# Patient Record
Sex: Female | Born: 1985
Health system: Southern US, Community
[De-identification: ages and names within clinical notes are randomized; demographics above are authoritative.]

## PROBLEM LIST (undated history)

## (undated) DIAGNOSIS — R000 Tachycardia, unspecified: Secondary | ICD-10-CM

## (undated) DIAGNOSIS — K219 Gastro-esophageal reflux disease without esophagitis: Secondary | ICD-10-CM

## (undated) DIAGNOSIS — R112 Nausea with vomiting, unspecified: Secondary | ICD-10-CM

## (undated) DIAGNOSIS — F4310 Post-traumatic stress disorder, unspecified: Secondary | ICD-10-CM

## (undated) DIAGNOSIS — I951 Orthostatic hypotension: Secondary | ICD-10-CM

## (undated) DIAGNOSIS — Z9889 Other specified postprocedural states: Secondary | ICD-10-CM

## (undated) DIAGNOSIS — R102 Pelvic and perineal pain: Secondary | ICD-10-CM

## (undated) DIAGNOSIS — G90A Postural orthostatic tachycardia syndrome (POTS): Secondary | ICD-10-CM

## (undated) DIAGNOSIS — N809 Endometriosis, unspecified: Secondary | ICD-10-CM

## (undated) DIAGNOSIS — N83209 Unspecified ovarian cyst, unspecified side: Secondary | ICD-10-CM

## (undated) DIAGNOSIS — G8929 Other chronic pain: Secondary | ICD-10-CM

## (undated) DIAGNOSIS — I498 Other specified cardiac arrhythmias: Secondary | ICD-10-CM

## (undated) DIAGNOSIS — F419 Anxiety disorder, unspecified: Secondary | ICD-10-CM

## (undated) DIAGNOSIS — S3992XA Unspecified injury of lower back, initial encounter: Secondary | ICD-10-CM

## (undated) HISTORY — PX: KNEE SURGERY: SHX244

## (undated) HISTORY — PX: ABDOMINAL HYSTERECTOMY: SHX81

## (undated) HISTORY — DX: Post-traumatic stress disorder, unspecified: F43.10

## (undated) HISTORY — DX: Endometriosis, unspecified: N80.9

## (undated) HISTORY — DX: Pelvic and perineal pain: R10.2

## (undated) HISTORY — DX: Gastro-esophageal reflux disease without esophagitis: K21.9

---
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
## 2008-05-30 ENCOUNTER — Emergency Department (HOSPITAL_COMMUNITY): Admission: EM | Admit: 2008-05-30 | Discharge: 2008-05-30 | Payer: Self-pay | Admitting: Emergency Medicine

## 2008-06-04 ENCOUNTER — Ambulatory Visit (HOSPITAL_COMMUNITY): Admission: RE | Admit: 2008-06-04 | Discharge: 2008-06-04 | Payer: Self-pay | Admitting: Pulmonary Disease

## 2008-06-04 IMAGING — CT CT PELVIS W/ CM
2 of 4 series · 16 of 46 positions shown, 18 images · IV contrast (agent unspecified)
Comparison: None

CT ABDOMEN

CLINICAL DATA: Abdominal and pelvic pain for 8 days

CT ABDOMEN AND PELVIS WITH CONTRAST
TECHNIQUE: Multidetector CT imaging of the abdomen and pelvis was
performed using the standard protocol following bolus
administration of intravenous contrast.  Sagittal and coronal MPR
images reconstructed from axial data set.
Contrast: Dilute oral contrast and 100 ml [NF]

[Series 2: abd_pel_with 5.0 b40f · axial · 0.68mm/px · z∈[-498,-113]mm · 13 of 88 slices shown, 15 images]
[im 7/88  soft-tissue]
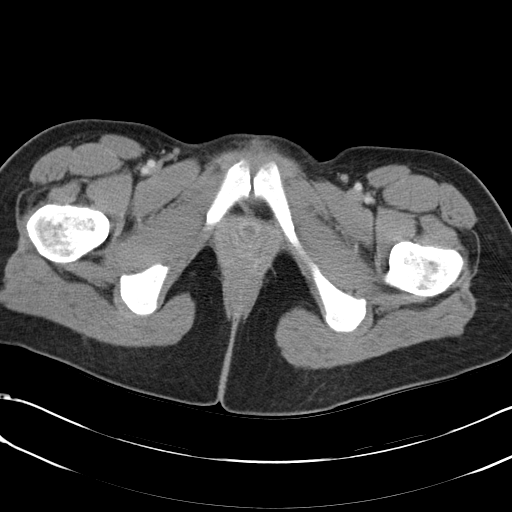
[im 7/88  bone]
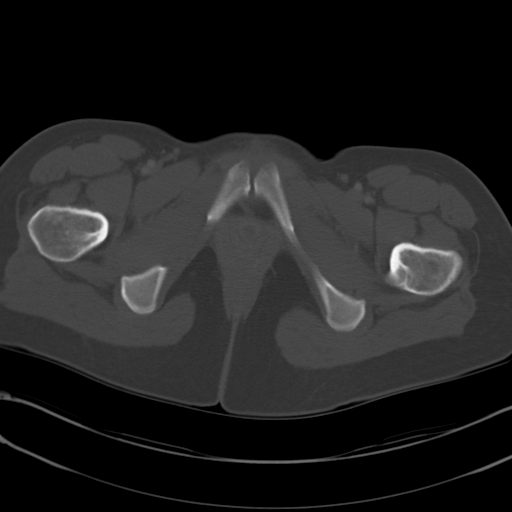
[im 13/88  soft-tissue]
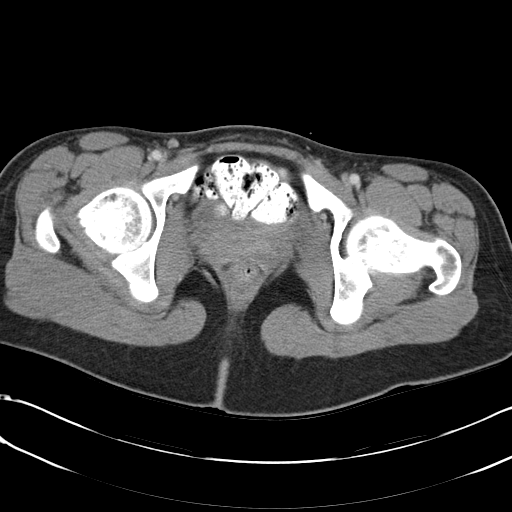
[im 20/88  soft-tissue]
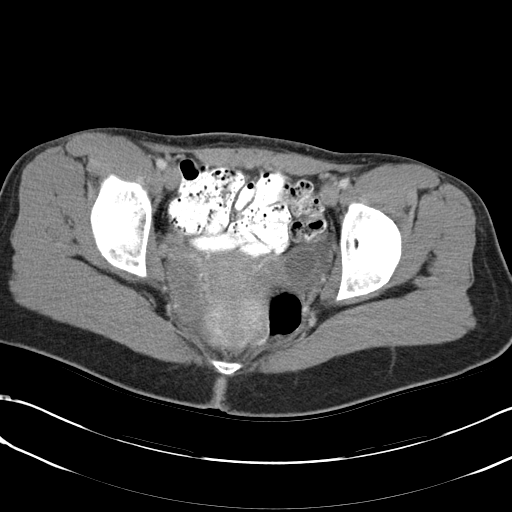
[im 26/88  soft-tissue]
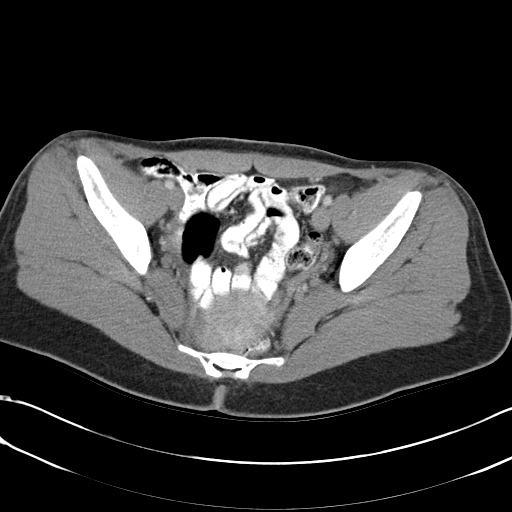
[im 33/88  soft-tissue]
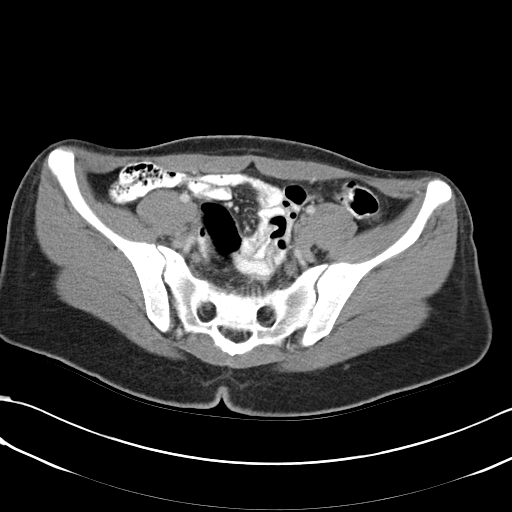
[im 39/88  soft-tissue]
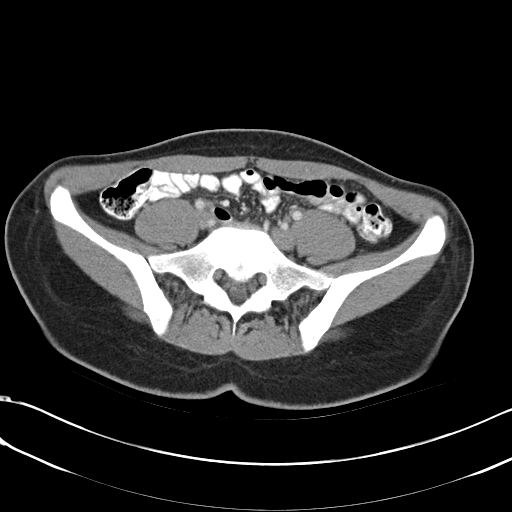
[im 46/88  soft-tissue]
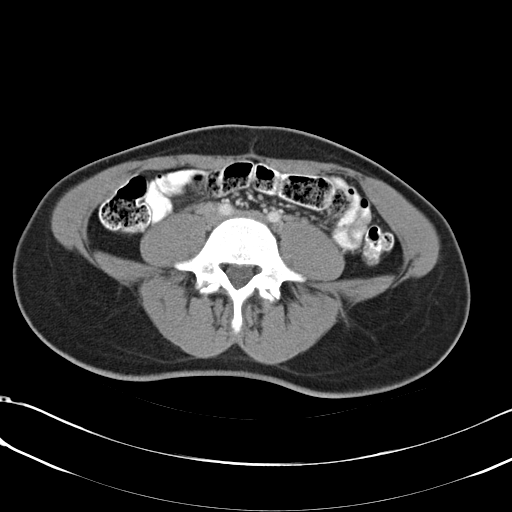
[im 52/88  soft-tissue]
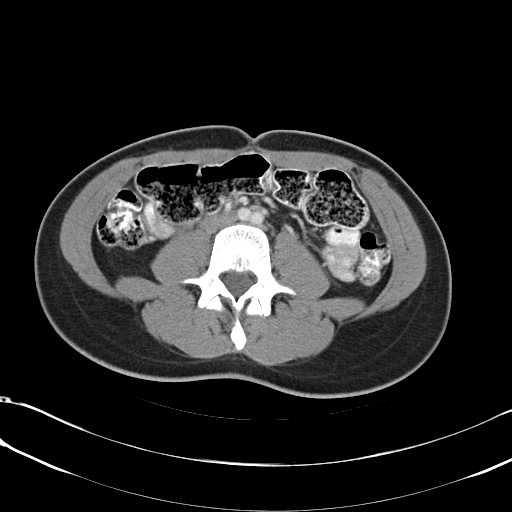
[im 59/88  soft-tissue]
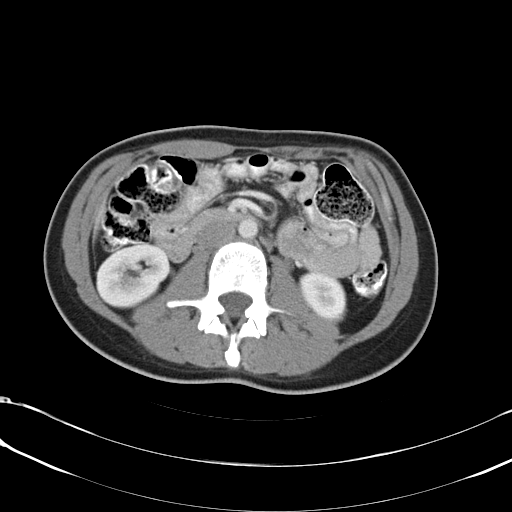
[im 59/88  bone]
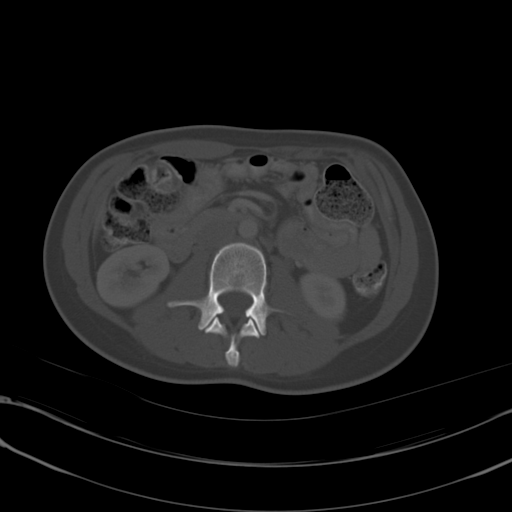
[im 65/88  soft-tissue]
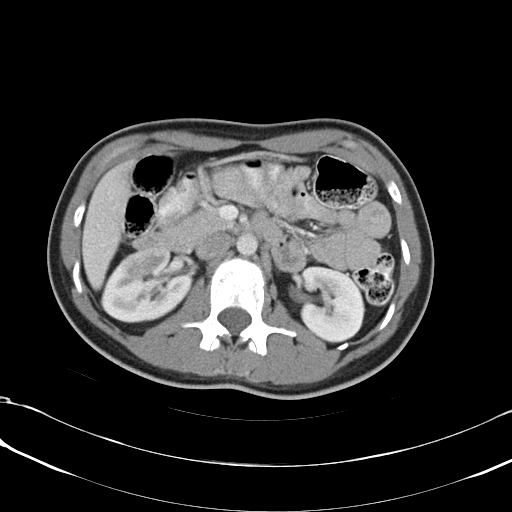
[im 71/88  soft-tissue]
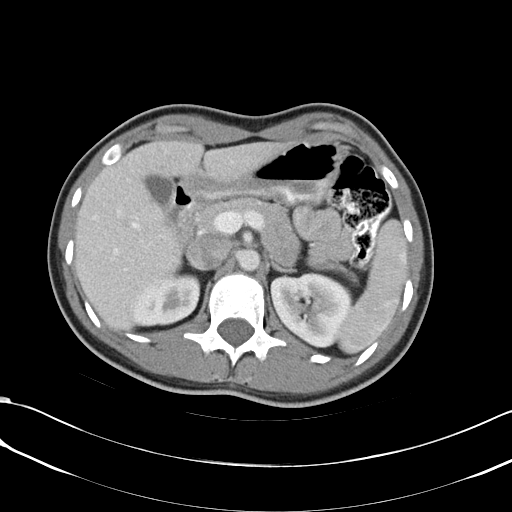
[im 78/88  soft-tissue]
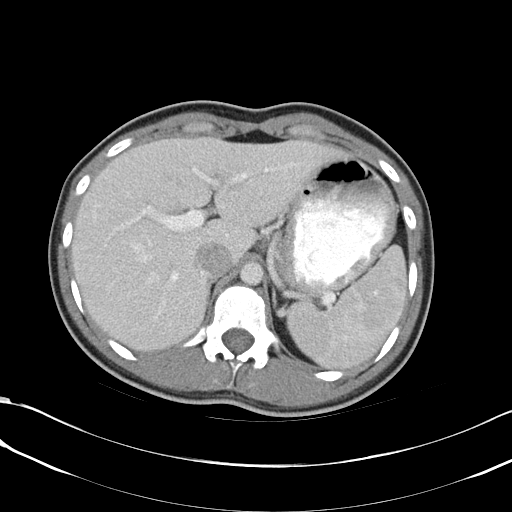
[im 84/88  soft-tissue]
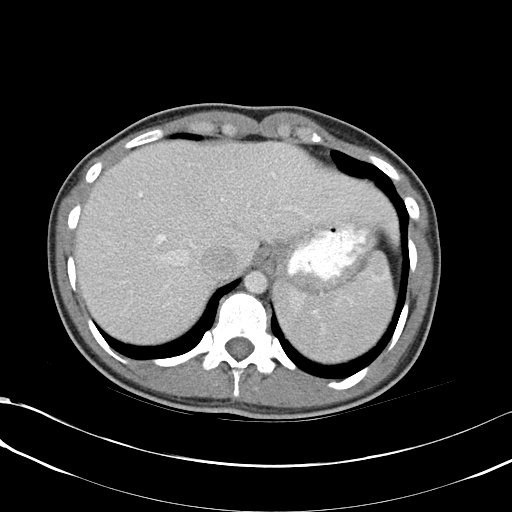

[Series 4: mpr cor post contrast (id) · coronal · 0.75mm/px · 3 of 61 slices shown]
[im 21/61  soft-tissue]
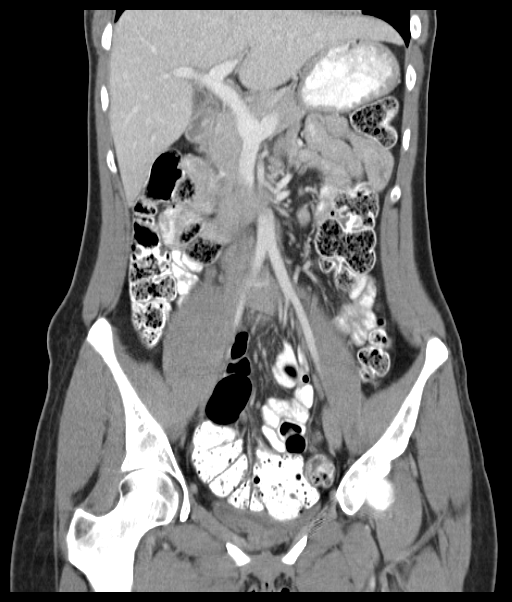
[im 27/61  soft-tissue]
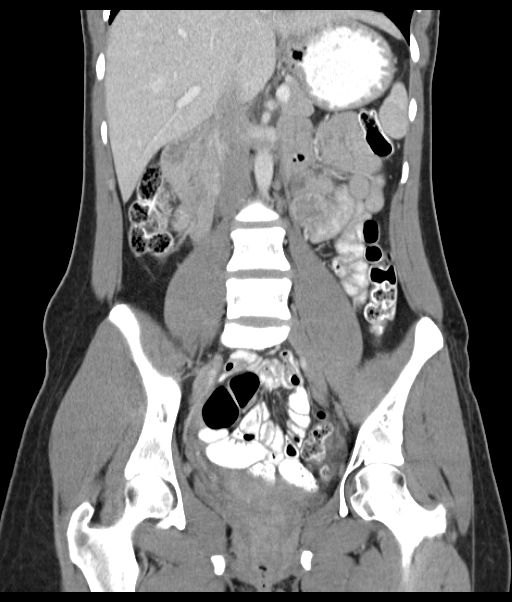
[im 34/61  soft-tissue]
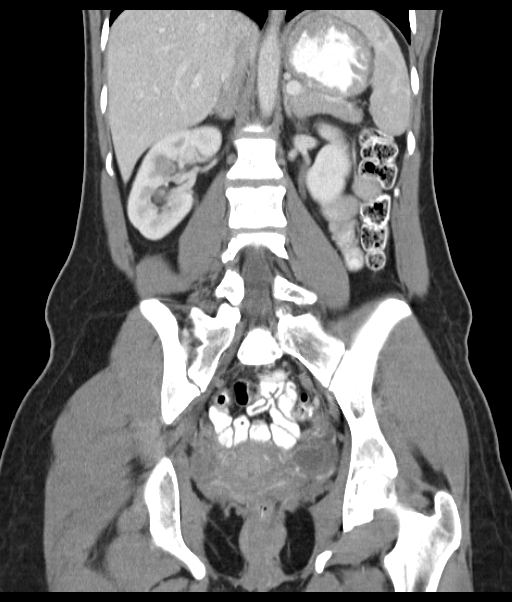

[16 of 46 positions shown; findings below may reference images not displayed]

FINDINGS: Lung bases clear.
Cranial margin of liver excluded.
No focal abnormalities within visualized portions of liver, spleen,
pancreas, left kidney, or adrenal glands.
Patchy irregularity of nephrogram of right kidney at upper pole,
suspicious for pyelonephritis.
No acute bony abnormalities.
IMPRESSION: Patchiness of nephrogram at upper pole right kidney, suspicious for
pyelonephritis.
Correlation with urinalysis recommended.

CT PELVIS
FINDINGS: Unremarkable uterus and decompressed urinary bladder.
Low attenuation left adnexal mass, 2.2 x 3.3 cm likely ovarian cyst
image 68.
Small amount of nonspecific free pelvic fluid.
Somewhat poorly defined contours of ovaries and adnexae
bilaterally, nonspecific.
Large and small bowel loops in pelvis normal.
Appendix not visualized.
No pelvic mass, adenopathy, free fluid, or inflammatory process.
IMPRESSION: Left ovarian cyst 3.3 x 2.2 cm.
Minimal free intrapelvic fluid, nonspecific.
No other definite intrapelvic abnormalities are identified.
Appendix is not visualized and while no definite evidence of
appendicitis is identified, recommend clinical correlation.

## 2008-06-07 ENCOUNTER — Encounter (HOSPITAL_COMMUNITY): Admission: RE | Admit: 2008-06-07 | Discharge: 2008-07-07 | Payer: Self-pay | Admitting: Orthopedic Surgery

## 2010-06-07 LAB — CBC
Hemoglobin: 12.3 g/dL (ref 12.0–15.0)
MCHC: 34.9 g/dL (ref 30.0–36.0)
RBC: 3.76 MIL/uL — ABNORMAL LOW (ref 3.87–5.11)
WBC: 8.6 10*3/uL (ref 4.0–10.5)

## 2010-06-07 LAB — DIFFERENTIAL
Lymphocytes Relative: 24 % (ref 12–46)
Lymphs Abs: 2.1 10*3/uL (ref 0.7–4.0)
Monocytes Absolute: 0.4 10*3/uL (ref 0.1–1.0)
Monocytes Relative: 5 % (ref 3–12)
Neutro Abs: 6 10*3/uL (ref 1.7–7.7)

## 2010-06-07 LAB — URINALYSIS, ROUTINE W REFLEX MICROSCOPIC
Bilirubin Urine: NEGATIVE
Glucose, UA: NEGATIVE mg/dL
Hgb urine dipstick: NEGATIVE
Ketones, ur: NEGATIVE mg/dL
Nitrite: NEGATIVE
Protein, ur: NEGATIVE mg/dL
Specific Gravity, Urine: 1.01 (ref 1.005–1.030)
Urobilinogen, UA: 0.2 mg/dL (ref 0.0–1.0)
pH: 6 (ref 5.0–8.0)

## 2010-06-07 LAB — BASIC METABOLIC PANEL WITH GFR
BUN: 11 mg/dL (ref 6–23)
CO2: 25 meq/L (ref 19–32)
Calcium: 8.7 mg/dL (ref 8.4–10.5)
Chloride: 106 meq/L (ref 96–112)
Creatinine, Ser: 0.81 mg/dL (ref 0.4–1.2)
GFR calc Af Amer: >60 mL/min (ref >60)
GFR calc non Af Amer: >60 mL/min (ref >60)
Glucose, Bld: 86 mg/dL (ref 70–99)
Potassium: 3.2 meq/L — ABNORMAL LOW (ref 3.5–5.1)
Sodium: 136 meq/L (ref 135–145)

## 2010-06-07 LAB — PREGNANCY, URINE: Preg Test, Ur: NEGATIVE

## 2010-07-14 NOTE — Discharge Summary (Signed)
   Rachel Griffith, SANE                      ACCOUNT NO.:  [id number]   MEDICAL RECORD NO.:  [id number]                   PATIENT TYPE:  INP   LOCATION:  A428                                 FACILITY:  [facility]   PHYSICIAN:  Edward L. Juanetta Gosling, M.D.             DATE OF BIRTH:  05-20-1985   DATE OF ADMISSION:  05/16/2002  DATE OF DISCHARGE:  05/17/2002                                 DISCHARGE SUMMARY   PROBLEM:  Nausea and vomiting, probably related to pain medication.  She is  status post knee surgery.   SUBJECTIVE:  The patient is an 25 year old who presented to the emergency  room after developing nausea and vomiting after taking Percocet for pain.  She had had knee surgery on the day of admission.  She became dizzy and  actually, to some extent, collapsed at home.  Her physical examination  showed a well-developed, well-nourished female who had a large wrap on her  right knee.  Her chest was clear, her heart was regular, her abdomen was  soft, electrolytes were normal.   HOSPITAL COURSE:  She was treated with IV fluids and investigation of  several pain medications was taken to find one that she could take that  would not make her nauseated.  It appears that Tylenol No. 3 two tablets did  the job.  She was placed on Vioxx 25 mg daily.  She had no further nausea or  vomiting, she was not dizzy, she was able to get up and move to some extent  except for the limitations placed by her knee.   DISPOSITION:  She is discharged home in improved condition.   MEDICATIONS:  1. Tylenol No. 3 two tablets q.4h. p.r.n. pain.  2. Vioxx 25 mg daily.   FOLLOW-UP:  She is encouraged to follow-up with Dr. Eulah Pont and Dr. Thurston Hole,  the orthopedic surgical group, regarding her knee surgery and whether  anything else needed to be done.                                               Edward L. Juanetta Gosling, M.D.    Gwenlyn Found  D:  05/17/2002  T:  05/18/2002  Job:  [id number]   cc:   M.D. Mckinley Jewel &  Salvatore Marvel

## 2010-07-14 NOTE — H&P (Signed)
Rachel Griffith, Rachel Griffith                      ACCOUNT NO.:  [id number]   MEDICAL RECORD NO.:  [id number]                   PATIENT TYPE:  INP   LOCATION:  A428                                 FACILITY:  [facility]   PHYSICIAN:  Gracelyn Nurse, M.D.              DATE OF BIRTH:  07/24/1985   DATE OF ADMISSION:  05/16/2002  DATE OF DISCHARGE:                                HISTORY & PHYSICAL   CHIEF COMPLAINT:  Nausea and vomiting.   HISTORY OF PRESENT ILLNESS:  This is an 25 year old white female who  presents with nausea and vomiting.  She had surgery on her right knee  yesterday, did well until she took some Percocet for pain.  She became  nauseated and dizzy.  She vomited a couple of times.  She called Dr. Eulah Pont,  the orthopedist who did the surgery, and he changed her medicine to Vicodin  and gave her some Phenergan.  However, she had the same results with taking  the Vicodin.  She comes tonight and has not vomiting in the ER but still  gets kind of dizzy when she stands up.   PAST MEDICAL HISTORY:  Nothing but the current knee surgery that she had  yesterday.   ALLERGIES:  SULFA.   CURRENT MEDICATIONS:  She takes none regularly.  Has been on the pain  medicine since yesterday.   SOCIAL HISTORY:  Does not smoke, does not drink alcohol, does not do any  drugs.  She is single.   FAMILY HISTORY:  Mother has hypothyroidism and has SVT.  Father had no  health problems.   REVIEW OF SYSTEMS:  As per HPI, remainder of systems negative.   PHYSICAL EXAMINATION:  VITAL SIGNS:  Temperature 97.5, pulse 72,  respirations 16, blood pressure 112/59.  GENERAL:  This is a well-nourished white female in no acute distress.  HEENT:  Pupils equal, round, reactive to light.  Extraocular movement  intact.  Oral mucosa is moist.  Oropharynx clear.  CARDIOVASCULAR:  Regular rate and rhythm.  No murmurs.  LUNGS:  Clear to auscultation.  ABDOMEN:  Soft, nontender, nondistended.  Bowel sounds are  positive.  EXTREMITIES:  No edema.  Right knee is in an ACE bandage.  NEUROLOGICAL:  Cranial nerves II-XII grossly intact.  No focal deficits.  She does become dizzy with some possible vertigo when she is seated in the  upright position.  SKIN:  Moist with no rash.   ASSESSMENT AND PLAN:  1. Nausea and vomiting.  This is likely secondary to the pain medications     plus or minus possible residual effects from the anesthesia.  She cannot     tell me if she got a general or local anesthesia.  She still feels dizzy     when she sits up.  I am not sure if this is true vertigo.  Will go ahead     and admit her for  observation, give her some IV fluids, antiemetics     p.r.n., and we will change her pain medicines to Tylenol No. 3 plus some     IV morphine for breakthrough pain to see if she will tolerate this a     little bit better.  2. Status post knee surgery.  She is to followup on Wednesday with Dr.     Eulah Pont.                                               Gracelyn Nurse, M.D.    JDJ/MEDQ  D:  05/16/2002  T:  05/17/2002  Job:  [id number]

## 2010-07-14 NOTE — Op Note (Signed)
   Rachel Griffith, Rachel Griffith                      ACCOUNT NO.:  [id number]   MEDICAL RECORD NO.:  [id number]                   PATIENT TYPE:  AMB   LOCATION:  DSC                                  FACILITY:  [facility]   PHYSICIAN:  Loreta Ave, M.D.              DATE OF BIRTH:  [birth year]   DATE OF PROCEDURE:  06/04/2002  DATE OF DISCHARGE:                                 OPERATIVE REPORT   PREOPERATIVE DIAGNOSIS:  Medial plica with synovitis, left knee.   POSTOPERATIVE DIAGNOSIS:  Medial plica with synovitis, left knee.   PROCEDURE:  Left knee examination under anesthesia, arthroscopy, with  excision of medial plica and partial synovectomy.   SURGEON:  Loreta Ave, M.D.   ASSISTANT:  Arlys John D. Petrarca, P.A.-C.   ANESTHESIA:  Knee block with sedation.   SPECIMENS:  None.   CULTURES:  None.   COMPLICATIONS:  None.   DRESSING:  Soft compressive.   Patient brought to the operating room and placed on the operating table in  the supine position.  After adequate anesthesia had been obtained, left knee  examined.  Full motion, good stability, palpable medial plica.  Good  patellofemoral tracking.  Tourniquet and leg holder applied.  Leg prepped  and draped in the usual sterile fashion.  Three portals created, one  superolateral, one each medial and lateral peripatellar.  An inflow catheter  introduced, knee distended, arthroscope introduced, knee inspected.  Medial  plica with associated synovitis extending down over the fat pad.  Excised in  its entirety.  Some roughing of the medial femoral condyle under the plica  debrided as well.  Remaining knee examined.  Good patellofemoral tracking  and cartilage without chondromalacia.  Medial meniscus, medial compartment,  lateral meniscus, lateral compartment were all otherwise normal.  Cruciate  ligaments intact.  Instruments and fluid removed.  Portals and knee injected  with Marcaine.  Portals closed with 4-0 nylon.  A sterile  compressive  dressing applied.  Anesthesia reversed.  Brought to the recovery room.  Tolerated the surgery well with no complications.                                               Loreta Ave, M.D.    DFM/MEDQ  D:  06/04/2002  T:  06/05/2002  Job:  [id number]

## 2010-07-14 NOTE — Group Therapy Note (Signed)
   Rachel Griffith, Rachel Griffith                        ACCOUNT NO.:  [id number]   MEDICAL RECORD NO.:  [id number]                   PATIENT TYPE:   LOCATION:                                       FACILITY:   PHYSICIAN:  Edward L. Juanetta Gosling, M.D.             DATE OF BIRTH:   DATE OF PROCEDURE:  DATE OF DISCHARGE:                                   PROGRESS NOTE   PROBLEM:  Admitted with nausea and vomiting.   SUBJECTIVE:  Ms. Ballowe is an 25 year old who had knee surgery and did well  as far as the knee surgery was concerned but when she went home, she  developed nausea and vomiting which appears to be related to her pain  medications. She became dizzy as well. She says that she is better now but  she is still having a lot of pain. The concern is that she is taking  Morphine for pain and I am going to try to get her on something that it a  little more easily taken by mouth to control her pain at home. I am also  going to add Vioxx. She is otherwise doing well and has no complaints.   OBJECTIVE:  Her examination shows that her chest is clear. Her heart is  regular. Abdomen is soft.   ASSESSMENT:  The knee, she says is painful but it does look appropriate  postoperatively.   PLAN:  It is encased in a large bandage.                                               Edward L. Juanetta Gosling, M.D.    ELH/MEDQ  D:  05/17/2002  T:  05/17/2002  Job:  [id number]

## 2010-10-29 ENCOUNTER — Encounter: Payer: Self-pay | Admitting: *Deleted

## 2010-10-29 ENCOUNTER — Emergency Department (HOSPITAL_COMMUNITY)
Admission: EM | Admit: 2010-10-29 | Discharge: 2010-10-29 | Disposition: A | Discharge status: Home / Self Care | Payer: Worker's Compensation | Attending: Emergency Medicine | Admitting: Emergency Medicine

## 2010-10-29 DIAGNOSIS — S61219A Laceration without foreign body of unspecified finger without damage to nail, initial encounter: Secondary | ICD-10-CM

## 2010-10-29 DIAGNOSIS — S61209A Unspecified open wound of unspecified finger without damage to nail, initial encounter: Secondary | ICD-10-CM | POA: Insufficient documentation

## 2010-10-29 DIAGNOSIS — Y9269 Other specified industrial and construction area as the place of occurrence of the external cause: Secondary | ICD-10-CM | POA: Insufficient documentation

## 2010-10-29 DIAGNOSIS — W268XXA Contact with other sharp object(s), not elsewhere classified, initial encounter: Secondary | ICD-10-CM | POA: Insufficient documentation

## 2010-10-29 DIAGNOSIS — F17200 Nicotine dependence, unspecified, uncomplicated: Secondary | ICD-10-CM | POA: Insufficient documentation

## 2010-10-29 MED ORDER — BACITRACIN ZINC 500 UNIT/GM EX OINT
TOPICAL_OINTMENT | Freq: Once | CUTANEOUS | Status: DC
  Filled 2010-10-29: qty 0.9

## 2010-10-29 NOTE — ED Notes (Signed)
Pt stated no needs

## 2010-10-29 NOTE — ED Provider Notes (Signed)
History     CSN: [id number] Arrival date & time: 10/29/2010  3:25 AM  Chief Complaint  Patient presents with  . Laceration   HPI Comments: Seen 9  Patient is a 25 y.o. female presenting with skin laceration. The history is provided by the patient.  Laceration  The incident occurred 6 to 12 hours ago. Pain location: leftt ring finger  The laceration is 1 cm in size. Injury mechanism: cut by kep being pushed in a tight space. The pain is at a severity of 3/10. The pain is mild. The pain has been constant since onset. She reports no foreign bodies present. Her tetanus status is UTD.    History reviewed. No pertinent past medical history.  Past Surgical History  Procedure Date  . Knee surgery     History reviewed. No pertinent family history.  History  Substance Use Topics  . Smoking status: Current Everyday Smoker  . Smokeless tobacco: Not on file  . Alcohol Use: Yes    OB History    Grav Para Term Preterm Abortions TAB SAB Ect Mult Living                  Review of Systems  All other systems reviewed and are negative.    Physical Exam  BP 116/72  Pulse 58  Temp(Src) 98.6 F (37 C) (Oral)  Resp 20  Ht 5\' 8"  (1.727 m)  Wt 135 lb (61.236 kg)  BMI 20.53 kg/m2  SpO2 99%  LMP 10/22/2010  Physical Exam  Nursing note and vitals reviewed. Constitutional: She is oriented to person, place, and time. She appears well-developed and well-nourished. No distress.  HENT:  Head: Normocephalic.  Eyes: EOM are normal.  Neck: Normal range of motion. Neck supple.  Cardiovascular: Normal rate, normal heart sounds and intact distal pulses.   Pulmonary/Chest: Effort normal.  Abdominal: Soft.  Neurological: She is alert and oriented to person, place, and time.  Skin: Skin is warm and dry.       Flap laceration 2 cm to left ring finger. Skin in place, bleeding controlled    ED Course  Procedures  Patient with non sutureable  laceration to left ring finger. Bacitracin and  dressing applied Annamarie Dawley., MD  MDM Reviewed: nursing note and vitals     Nicoletta Dress. Colon Branch, MD 10/29/10 [phone]

## 2010-10-29 NOTE — ED Notes (Signed)
Pt was moving a keg and sliced her ring finger on her left hand.

## 2012-02-27 HISTORY — PX: OTHER SURGICAL HISTORY: SHX169

## 2012-05-08 ENCOUNTER — Encounter (HOSPITAL_COMMUNITY): Payer: Self-pay | Admitting: *Deleted

## 2012-05-08 ENCOUNTER — Emergency Department (HOSPITAL_COMMUNITY)
Admission: EM | Admit: 2012-05-08 | Discharge: 2012-05-08 | Disposition: A | Discharge status: Home / Self Care | Payer: Managed Care, Other (non HMO) | Attending: Emergency Medicine | Admitting: Emergency Medicine

## 2012-05-08 ENCOUNTER — Emergency Department (HOSPITAL_COMMUNITY): Payer: Managed Care, Other (non HMO)

## 2012-05-08 DIAGNOSIS — Z3202 Encounter for pregnancy test, result negative: Secondary | ICD-10-CM | POA: Insufficient documentation

## 2012-05-08 DIAGNOSIS — F17200 Nicotine dependence, unspecified, uncomplicated: Secondary | ICD-10-CM | POA: Insufficient documentation

## 2012-05-08 DIAGNOSIS — Z8742 Personal history of other diseases of the female genital tract: Secondary | ICD-10-CM | POA: Insufficient documentation

## 2012-05-08 DIAGNOSIS — N760 Acute vaginitis: Secondary | ICD-10-CM | POA: Insufficient documentation

## 2012-05-08 DIAGNOSIS — R102 Pelvic and perineal pain unspecified side: Secondary | ICD-10-CM

## 2012-05-08 DIAGNOSIS — N949 Unspecified condition associated with female genital organs and menstrual cycle: Secondary | ICD-10-CM | POA: Insufficient documentation

## 2012-05-08 DIAGNOSIS — G8929 Other chronic pain: Secondary | ICD-10-CM | POA: Insufficient documentation

## 2012-05-08 DIAGNOSIS — B9689 Other specified bacterial agents as the cause of diseases classified elsewhere: Secondary | ICD-10-CM

## 2012-05-08 HISTORY — DX: Other chronic pain: G89.29

## 2012-05-08 HISTORY — DX: Endometriosis, unspecified: N80.9

## 2012-05-08 HISTORY — DX: Unspecified ovarian cyst, unspecified side: N83.209

## 2012-05-08 HISTORY — DX: Pelvic and perineal pain: R10.2

## 2012-05-08 LAB — COMPREHENSIVE METABOLIC PANEL
ALT: 10 U/L (ref 0–35)
AST: 12 U/L (ref 0–37)
CO2: 25 mEq/L (ref 19–32)
Calcium: 9 mg/dL (ref 8.4–10.5)
Chloride: 103 mEq/L (ref 96–112)
Creatinine, Ser: 0.81 mg/dL (ref 0.50–1.10)
GFR calc Af Amer: >90 mL/min (ref >90)
GFR calc non Af Amer: >90 mL/min (ref >90)
Glucose, Bld: 94 mg/dL (ref 70–99)
Sodium: 139 mEq/L (ref 135–145)
Total Bilirubin: 0.2 mg/dL — ABNORMAL LOW (ref 0.3–1.2)

## 2012-05-08 LAB — CBC WITH DIFFERENTIAL/PLATELET
Basophils Absolute: 0 10*3/uL (ref 0.0–0.1)
Eosinophils Relative: 1 % (ref 0–5)
HCT: 34.9 % — ABNORMAL LOW (ref 36.0–46.0)
Lymphocytes Relative: 29 % (ref 12–46)
Lymphs Abs: 2.2 10*3/uL (ref 0.7–4.0)
MCV: 92.3 fL (ref 78.0–100.0)
Monocytes Absolute: 0.5 10*3/uL (ref 0.1–1.0)
Neutro Abs: 4.6 10*3/uL (ref 1.7–7.7)
RBC: 3.78 MIL/uL — ABNORMAL LOW (ref 3.87–5.11)
WBC: 7.4 10*3/uL (ref 4.0–10.5)

## 2012-05-08 LAB — URINALYSIS, ROUTINE W REFLEX MICROSCOPIC
Bilirubin Urine: NEGATIVE
Glucose, UA: NEGATIVE mg/dL
Hgb urine dipstick: NEGATIVE
Specific Gravity, Urine: 1.01 (ref 1.005–1.030)
pH: 5.5 (ref 5.0–8.0)

## 2012-05-08 LAB — WET PREP, GENITAL: Trich, Wet Prep: NONE SEEN

## 2012-05-08 LAB — PREGNANCY, URINE: Preg Test, Ur: NEGATIVE

## 2012-05-08 IMAGING — US US PELVIS COMPLETE
1 series · 14 of 25 positions shown · non-contrast
Comparison: CT [DATE]

CLINICAL DATA: Left-sided pelvic pain.

TRANSABDOMINAL AND TRANSVAGINAL ULTRASOUND OF PELVIS
DOPPLER ULTRASOUND OF OVARIES
TECHNIQUE: Both transabdominal and transvaginal ultrasound
examinations of the pelvis were performed including evaluation of
the uterus, ovaries, adnexal regions, and pelvic cul-de-sac. Color
and duplex Doppler ultrasound was utilized to evaluate blood flow
to the ovaries.

[Series 1: us pelvis complete · 0.29mm/px · 14 of 82 slices shown]
[im 1/82]
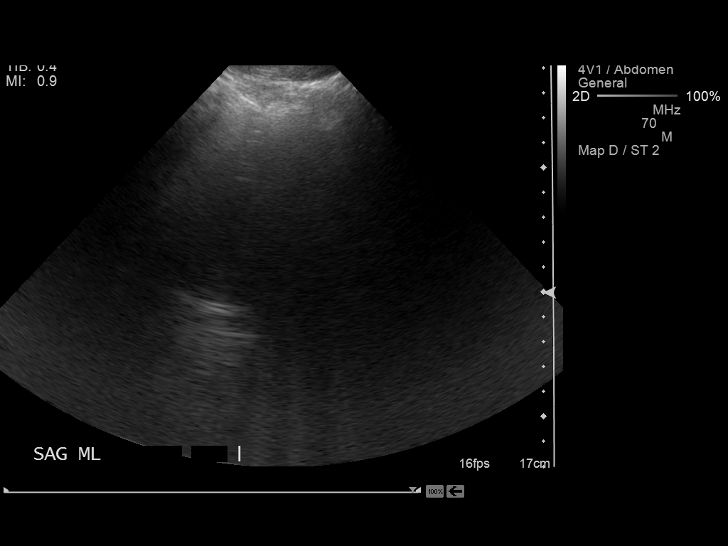
[im 7/82]
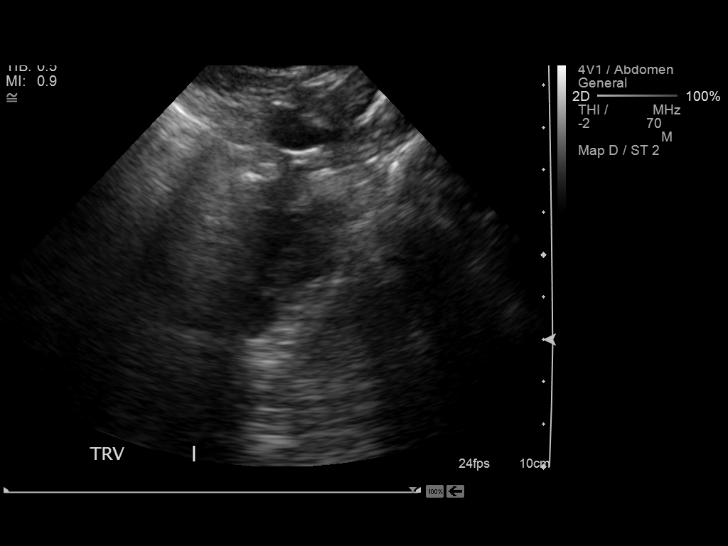
[im 14/82]
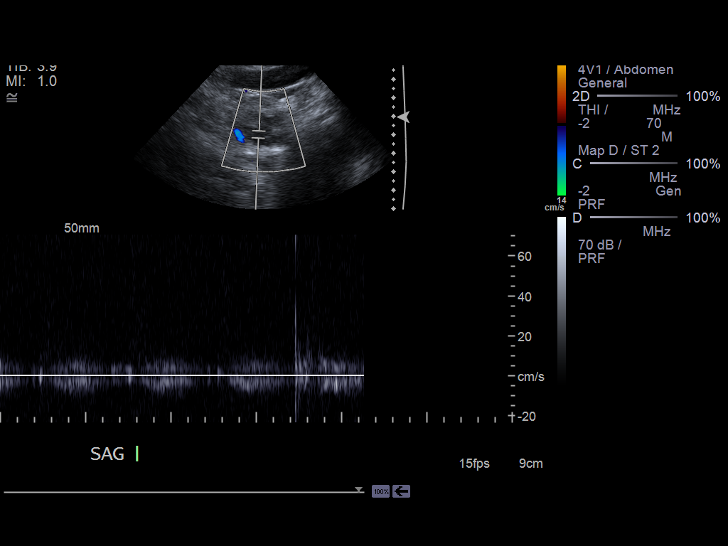
[im 21/82]
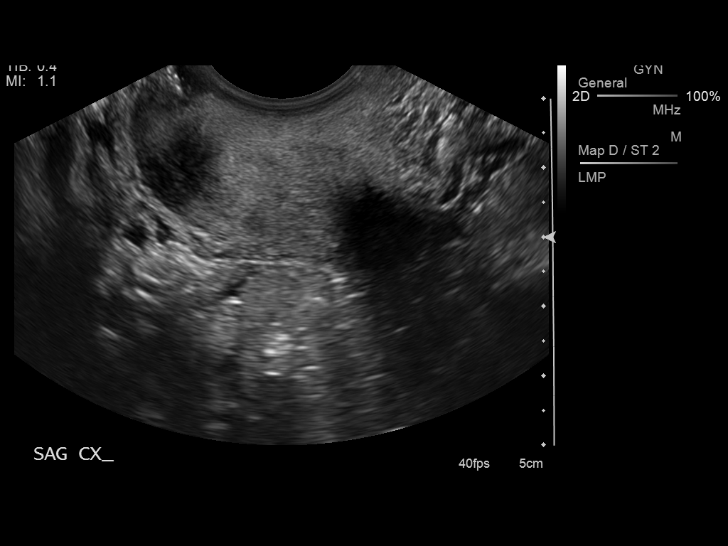
[im 28/82]
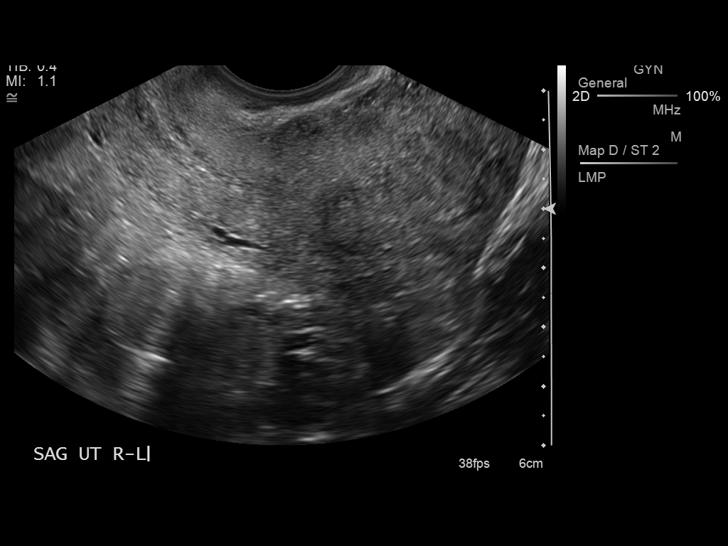
[im 31/82]
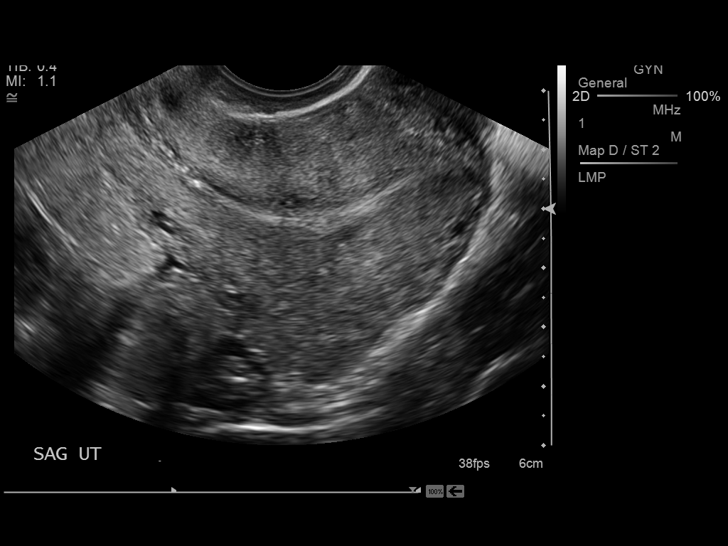
[im 38/82]
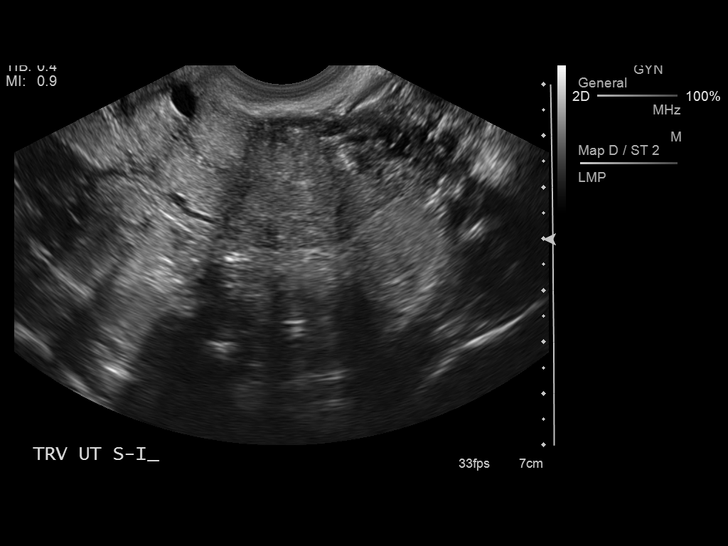
[im 44/82]
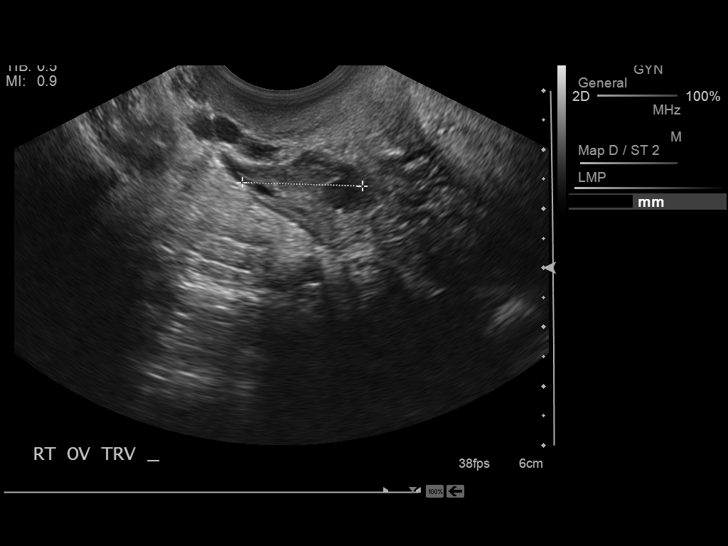
[im 51/82]
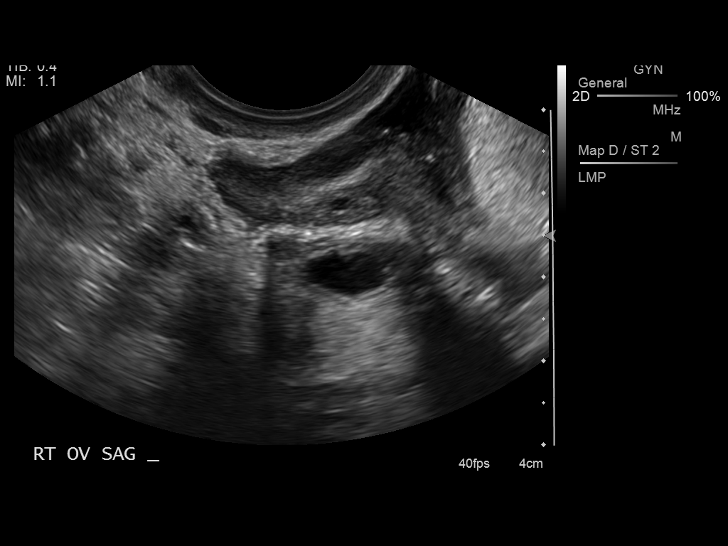
[im 55/82]
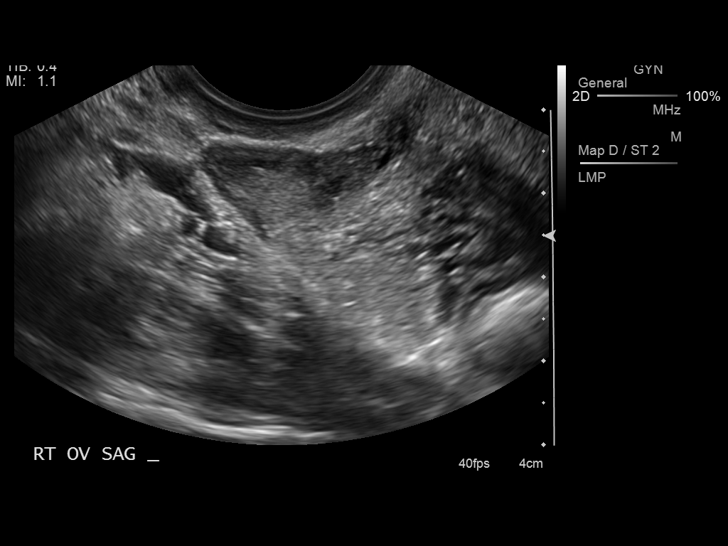
[im 61/82]
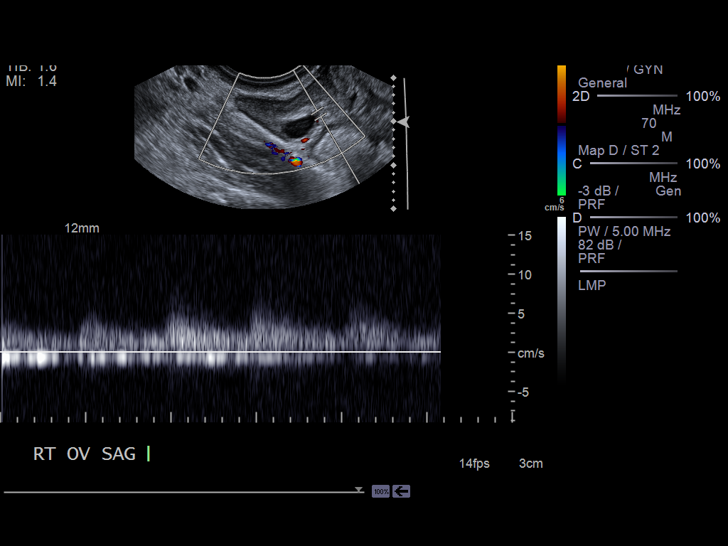
[im 68/82]
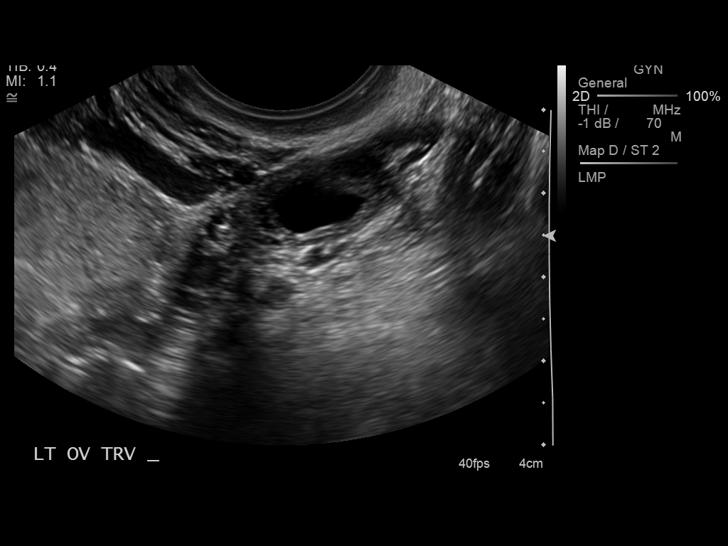
[im 75/82]
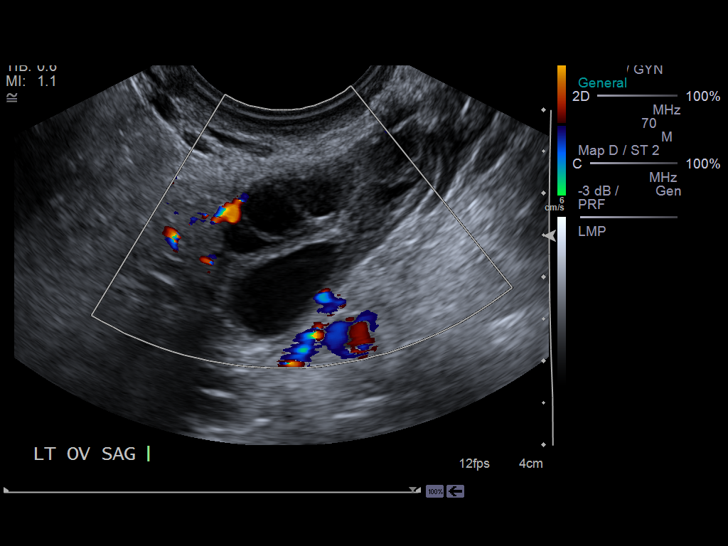
[im 82/82]
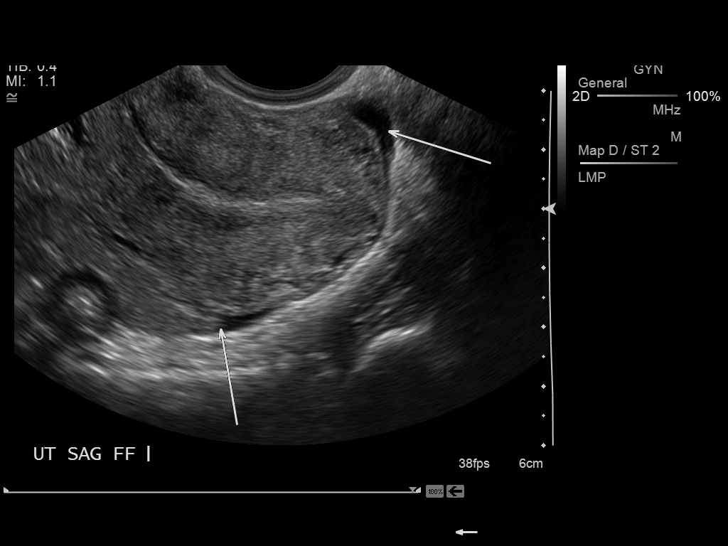

[14 of 25 positions shown; findings below may reference images not displayed]

FINDINGS: Uterus: 6.8 x 4.2 x 5.2 cm.  Uterus is retroverted.  No focal
abnormality.  Mildly heterogeneous echotexture.

Endometrium: Normal thickness and appearance, 5 mm.

Right Ovary: 3.3 x 1.2 x 2.0 cm. Normal size and echotexture.  No
adnexal masses.  Small follicles.  Arterial and venous blood flow
documented.

Left Ovary: 3.6 x 1.8 x 2.7 cm.  Multiple follicles within the left
ovary, the largest of 0.5 cm.  Arterial and venous blood flow
documented.

Other Findings:  Small amount of free fluid in the pelvis.
IMPRESSION: No evidence of ovarian torsion.  Multiple follicles within the left
ovary.

## 2012-05-08 MED ORDER — NAPROXEN 250 MG PO TABS: 250.0000 mg | ORAL_TABLET | Freq: Two times a day (BID) | ORAL | Status: DC

## 2012-05-08 MED ORDER — HYDROMORPHONE HCL PF 1 MG/ML IJ SOLN
1.0000 mg | Freq: Once | INTRAMUSCULAR | Status: AC
  Administered 2012-05-08: 1 mg via INTRAMUSCULAR
  Filled 2012-05-08: qty 1

## 2012-05-08 MED ORDER — HYDROCODONE-ACETAMINOPHEN 5-325 MG PO TABS: ORAL_TABLET | ORAL | Status: DC

## 2012-05-08 MED ORDER — METRONIDAZOLE 500 MG PO TABS: 500.0000 mg | ORAL_TABLET | Freq: Two times a day (BID) | ORAL | Status: DC

## 2012-05-08 NOTE — ED Provider Notes (Signed)
History     CSN: [id number]  Arrival date & time 05/08/12  9629   First MD Initiated Contact with Patient 05/08/12 [phone]      Chief Complaint  Patient presents with  . Pelvic Pain     HPI Pt was seen at 0955.   Per pt, c/o gradual onset and persistence of constant acute flair of her chronic left sided pelvic "pain" for the past 4 years, worse over the past several days.  Pt states she has been unable to get in to see her usual OB/GYN MD in [facility] and has run out of her pain meds. Pt's pain worsened since she has not been taking her pain meds. Describes her pain as per her usual symptom pattern for the past 4 years.  States she does not have a definitive dx; "they just say I might have an ovarian cyst or endometriosis."  Denies any change in her usual chronic pain pattern, no N/V/D, no back pain, no fevers, no dysuria, no vaginal bleeding/discharge.     Past Medical History  Diagnosis Date  . Endometriosis   . Ovarian cyst   . Chronic pelvic pain in female     Past Surgical History  Procedure Laterality Date  . Knee surgery      History  Substance Use Topics  . Smoking status: Current Every Day Smoker  . Smokeless tobacco: Not on file  . Alcohol Use: Yes    OB History   Grav Para Term Preterm Abortions TAB SAB Ect Mult Living   2 2 2              Review of Systems ROS: Statement: All systems negative except as marked or noted in the HPI; Constitutional: Negative for fever and chills. ; ; Eyes: Negative for eye pain, redness and discharge. ; ; ENMT: Negative for ear pain, hoarseness, nasal congestion, sinus pressure and sore throat. ; ; Cardiovascular: Negative for chest pain, palpitations, diaphoresis, dyspnea and peripheral edema. ; ; Respiratory: Negative for cough, wheezing and stridor. ; ; Gastrointestinal: Negative for nausea, vomiting, diarrhea, abdominal pain, blood in stool, hematemesis, jaundice and rectal bleeding. . ; ; Genitourinary: Negative for dysuria,  flank pain and hematuria. ; ; GYN:  +pelvic pain. No vaginal bleeding, no vaginal discharge, no vulvar pain.;; Musculoskeletal: Negative for back pain and neck pain. Negative for swelling and trauma.; ; Skin: Negative for pruritus, rash, abrasions, blisters, bruising and skin lesion.; ; Neuro: Negative for headache, lightheadedness and neck stiffness. Negative for weakness, altered level of consciousness , altered mental status, extremity weakness, paresthesias, involuntary movement, seizure and syncope.        Allergies  Sulfa antibiotics  Home Medications  No current outpatient prescriptions on file.  BP 114/66  Pulse 87  Temp(Src) 98.3 F (36.8 C) (Oral)  Resp 16  Ht 5\' 8"  (1.727 m)  Wt 142 lb (64.411 kg)  BMI 21.6 kg/m2  SpO2 100%  LMP 02/27/2012  Physical Exam 1000: Physical examination:  Nursing notes reviewed; Vital signs and O2 SAT reviewed;  Constitutional: Well developed, Well nourished, Well hydrated, In no acute distress; Head:  Normocephalic, atraumatic; Eyes: EOMI, PERRL, No scleral icterus; ENMT: Mouth and pharynx normal, Mucous membranes moist; Neck: Supple, Full range of motion, No lymphadenopathy; Cardiovascular: Regular rate and rhythm, No murmur, rub, or gallop; Respiratory: Breath sounds clear & equal bilaterally, No rales, rhonchi, wheezes.  Speaking full sentences with ease, Normal respiratory effort/excursion; Chest: Nontender, Movement normal; Abdomen: Soft, +mild LLQ tenderness to  palp. No rebound or guarding. Nondistended, Normal bowel sounds; Genitourinary: No CVA tenderness. Pelvic exam performed with permission of pt and female ED tech assist during exam.  External genitalia w/o lesions. Vaginal vault without discharge.  Cervix w/o lesions, not friable, GC/chlam and wet prep obtained and sent to lab.  Bimanual exam w/o CMT, uterine or right adnexal tenderness. +left pelvic tenderness.;; Extremities: Pulses normal, No tenderness, No edema, No calf edema or  asymmetry.; Neuro: AA&Ox3, Major CN grossly intact.  Speech clear. Climbs on and off stretcher easily by herself. Gait steady. No gross focal motor or sensory deficits in extremities.; Skin: Color normal, Warm, Dry.   ED Course  Procedures   MDM  MDM Reviewed: previous chart, nursing note and vitals Interpretation: labs and ultrasound   Results for orders placed during the hospital encounter of 05/08/12  WET PREP, GENITAL      Result Value Range   Yeast Wet Prep HPF POC NONE SEEN  NONE SEEN   Trich, Wet Prep NONE SEEN  NONE SEEN   Clue Cells Wet Prep HPF POC MANY (*) NONE SEEN   WBC, Wet Prep HPF POC MANY (*) NONE SEEN  URINALYSIS, ROUTINE W REFLEX MICROSCOPIC      Result Value Range   Color, Urine YELLOW  YELLOW   APPearance CLEAR  CLEAR   Specific Gravity, Urine 1.010  1.005 - 1.030   pH 5.5  5.0 - 8.0   Glucose, UA NEGATIVE  NEGATIVE mg/dL   Hgb urine dipstick NEGATIVE  NEGATIVE   Bilirubin Urine NEGATIVE  NEGATIVE   Ketones, ur NEGATIVE  NEGATIVE mg/dL   Protein, ur NEGATIVE  NEGATIVE mg/dL   Urobilinogen, UA 0.2  0.0 - 1.0 mg/dL   Nitrite NEGATIVE  NEGATIVE   Leukocytes, UA NEGATIVE  NEGATIVE  PREGNANCY, URINE      Result Value Range   Preg Test, Ur NEGATIVE  NEGATIVE  CBC WITH DIFFERENTIAL      Result Value Range   WBC 7.4  4.0 - 10.5 K/uL   RBC 3.78 (*) 3.87 - 5.11 MIL/uL   Hemoglobin 12.4  12.0 - 15.0 g/dL   HCT 13.0 (*) 86.5 - 78.4 %   MCV 92.3  78.0 - 100.0 fL   MCH 32.8  26.0 - 34.0 pg   MCHC 35.5  30.0 - 36.0 g/dL   RDW 69.6  29.5 - 28.4 %   Platelets 180  150 - 400 K/uL   Neutrophils Relative 63  43 - 77 %   Neutro Abs 4.6  1.7 - 7.7 K/uL   Lymphocytes Relative 29  12 - 46 %   Lymphs Abs 2.2  0.7 - 4.0 K/uL   Monocytes Relative 7  3 - 12 %   Monocytes Absolute 0.5  0.1 - 1.0 K/uL   Eosinophils Relative 1  0 - 5 %   Eosinophils Absolute 0.1  0.0 - 0.7 K/uL   Basophils Relative 0  0 - 1 %   Basophils Absolute 0.0  0.0 - 0.1 K/uL  COMPREHENSIVE  METABOLIC PANEL      Result Value Range   Sodium 139  135 - 145 mEq/L   Potassium 3.8  3.5 - 5.1 mEq/L   Chloride 103  96 - 112 mEq/L   CO2 25  19 - 32 mEq/L   Glucose, Bld 94  70 - 99 mg/dL   BUN 15  6 - 23 mg/dL   Creatinine, Ser 1.32  0.50 - 1.10 mg/dL  Calcium 9.0  8.4 - 10.5 mg/dL   Total Protein 6.9  6.0 - 8.3 g/dL   Albumin 3.9  3.5 - 5.2 g/dL   AST 12  0 - 37 U/L   ALT 10  0 - 35 U/L   Alkaline Phosphatase 36 (*) 39 - 117 U/L   Total Bilirubin 0.2 (*) 0.3 - 1.2 mg/dL   GFR calc non Af Amer >90  >90 mL/min   GFR calc Af Amer >90  >90 mL/min  LIPASE, BLOOD      Result Value Range   Lipase 36  11 - 59 U/L   US Transvaginal Non-ob 05/08/2012  *RADIOLOGY REPORT*  Clinical Data: Left-sided pelvic pain.  TRANSABDOMINAL AND TRANSVAGINAL ULTRASOUND OF PELVIS DOPPLER ULTRASOUND OF OVARIES  Technique:  Both transabdominal and transvaginal ultrasound examinations of the pelvis were performed including evaluation of the uterus, ovaries, adnexal regions, and pelvic cul-de-sac. Color and duplex Doppler ultrasound was utilized to evaluate blood flow to the ovaries.  Comparison: CT 06/04/2008  Findings:  Uterus: 6.8 x 4.2 x 5.2 cm.  Uterus is retroverted.  No focal abnormality.  Mildly heterogeneous echotexture.  Endometrium: Normal thickness and appearance, 5 mm.  Right Ovary: 3.3 x 1.2 x 2.0 cm. Normal size and echotexture.  No adnexal masses.  Small follicles.  Arterial and venous blood flow documented.  Left Ovary: 3.6 x 1.8 x 2.7 cm.  Multiple follicles within the left ovary, the largest of 0.5 cm.  Arterial and venous blood flow documented.  Other Findings:  Small amount of free fluid in the pelvis.  IMPRESSION: No evidence of ovarian torsion.  Multiple follicles within the left ovary.   Original Report Authenticated By: Charlett Nose, M.D.      1245:  Pt states she has spoken to her OB/GYN while she has been in the ED.  States she has scheduled exploratory laparoscopy for next week to further  evaluate her chronic pain.  Pt wants to go home now. Requesting a refill of her usual narcotic pain meds. Dx and testing d/w pt and family.  Questions answered.  Verb understanding, agreeable to d/c home with outpt f/u.            Laray Anger, DO 05/10/12 [phone]

## 2012-05-08 NOTE — ED Notes (Addendum)
Pt states that she started having left sided lower abdominal pain a couple of days ago worse than her usual, states she has pain after pressing on the area on the left.   States she has had this pain in her left lower abdomen for over 4 years and her previous doctors had mentions ovarian cysts and endometriosis, states that she has an exploratory laparotomy scheduled next week.

## 2012-05-08 NOTE — ED Notes (Signed)
Patient w/dx of endometriosis awaiting surgery at some point in near future.  Has not been able to get into see PMD for refill of pain medication.  Pain currently 10/10, fluctuating.

## 2012-05-09 LAB — GC/CHLAMYDIA PROBE AMP
CT Probe RNA: NEGATIVE
GC Probe RNA: NEGATIVE

## 2013-04-24 ENCOUNTER — Emergency Department (HOSPITAL_COMMUNITY)
Admission: EM | Admit: 2013-04-24 | Discharge: 2013-04-24 | Disposition: A | Discharge status: Home / Self Care | Payer: Managed Care, Other (non HMO) | Attending: Emergency Medicine | Admitting: Emergency Medicine

## 2013-04-24 ENCOUNTER — Encounter (HOSPITAL_COMMUNITY): Payer: Self-pay | Admitting: Emergency Medicine

## 2013-04-24 DIAGNOSIS — R059 Cough, unspecified: Secondary | ICD-10-CM | POA: Insufficient documentation

## 2013-04-24 DIAGNOSIS — R05 Cough: Secondary | ICD-10-CM | POA: Insufficient documentation

## 2013-04-24 DIAGNOSIS — IMO0002 Reserved for concepts with insufficient information to code with codable children: Secondary | ICD-10-CM | POA: Insufficient documentation

## 2013-04-24 DIAGNOSIS — Z791 Long term (current) use of non-steroidal anti-inflammatories (NSAID): Secondary | ICD-10-CM | POA: Insufficient documentation

## 2013-04-24 DIAGNOSIS — M549 Dorsalgia, unspecified: Secondary | ICD-10-CM

## 2013-04-24 DIAGNOSIS — Z79899 Other long term (current) drug therapy: Secondary | ICD-10-CM | POA: Insufficient documentation

## 2013-04-24 DIAGNOSIS — G8929 Other chronic pain: Secondary | ICD-10-CM | POA: Insufficient documentation

## 2013-04-24 DIAGNOSIS — Z792 Long term (current) use of antibiotics: Secondary | ICD-10-CM | POA: Insufficient documentation

## 2013-04-24 DIAGNOSIS — Y9289 Other specified places as the place of occurrence of the external cause: Secondary | ICD-10-CM | POA: Insufficient documentation

## 2013-04-24 DIAGNOSIS — Z8742 Personal history of other diseases of the female genital tract: Secondary | ICD-10-CM | POA: Insufficient documentation

## 2013-04-24 DIAGNOSIS — F17200 Nicotine dependence, unspecified, uncomplicated: Secondary | ICD-10-CM | POA: Insufficient documentation

## 2013-04-24 DIAGNOSIS — Y9323 Activity, snow (alpine) (downhill) skiing, snow boarding, sledding, tobogganing and snow tubing: Secondary | ICD-10-CM | POA: Insufficient documentation

## 2013-04-24 DIAGNOSIS — W010XXA Fall on same level from slipping, tripping and stumbling without subsequent striking against object, initial encounter: Secondary | ICD-10-CM | POA: Insufficient documentation

## 2013-04-24 HISTORY — DX: Unspecified injury of lower back, initial encounter: S39.92XA

## 2013-04-24 MED ORDER — DIAZEPAM 10 MG PO TABS: 10.0000 mg | ORAL_TABLET | Freq: Three times a day (TID) | ORAL | Status: DC | PRN

## 2013-04-24 MED ORDER — OXYCODONE-ACETAMINOPHEN 5-325 MG PO TABS: 1.0000 | ORAL_TABLET | ORAL | Status: DC | PRN
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
## 2013-04-24 NOTE — Discharge Instructions (Signed)
Continue taking diclofenac and cyclobenzaprine (Flexeril). Take diazepam as needed for muscle spasm not relieved by cyclobenzaprine. Take oxycodone-acetaminophen as needed for pain not controlled by diclofenac.  Diazepam tablets What is this medicine? DIAZEPAM (dye AZ e pam) is a benzodiazepine. It is used to treat anxiety and nervousness. It also can help treat alcohol withdrawal, relax muscles, and treat certain types of seizures. This medicine may be used for other purposes; ask your health care provider or pharmacist if you have questions. COMMON BRAND NAME(S): Valium What should I tell my health care provider before I take this medicine? They need to know if you have any of these conditions -an alcohol or drug abuse problem -bipolar disorder, depression, psychosis or other mental health condition -glaucoma -kidney or liver disease -lung or breathing disease -myasthenia gravis -Parkinson's disease -seizures or a history of seizures -suicidal thoughts -an unusual or allergic reaction to diazepam, other benzodiazepines, foods, dyes, or preservatives -pregnant or trying to get pregnant -breast-feeding How should I use this medicine? Take this medicine by mouth with a glass of water. Follow the directions on the prescription label. If this medicine upsets your stomach, take it with food or milk. Take your doses at regular intervals. Do not take your medicine more often than directed. If you have been taking this medicine regularly for some time, do not suddenly stop taking it. You must gradually reduce the dose or you may get severe side effects. Ask your doctor or health care professional for advice. Even after you stop taking this medicine it can still affect your body for several days. Talk to your pediatrician regarding the use of this medicine in children. Special care may be needed. Overdosage: If you think you have taken too much of this medicine contact a poison control center or  emergency room at once. NOTE: This medicine is only for you. Do not share this medicine with others. What if I miss a dose? If you miss a dose, take it as soon as you can. If it is almost time for your next dose, take only that dose. Do not take double or extra doses. What may interact with this medicine? -cimetidine -grapefruit juice -herbal or dietary supplements like kava kava, melatonin, St. John's Wort, or valerian -medicines for anxiety or sleeping problems, like alprazolam, lorazepam, or triazolam -medicines for depression, mental problems or psychiatric disturbances -medicines for HIV infection or AIDS -prescription pain medicines -rifampin, rifapentine, or rifabutin -some medicines for seizures like carbamazepine, phenobarbital, phenytoin, or primidone This list may not describe all possible interactions. Give your health care provider a list of all the medicines, herbs, non-prescription drugs, or dietary supplements you use. Also tell them if you smoke, drink alcohol, or use illegal drugs. Some items may interact with your medicine. What should I watch for while using this medicine? Visit your doctor or health care professional for regular checks on your progress. Your body can become dependent on this medicine. Ask your doctor or health care professional if you still need to take it. You may get drowsy or dizzy. Do not drive, use machinery, or do anything that needs mental alertness until you know how this medicine affects you. To reduce the risk of dizzy and fainting spells, do not stand or sit up quickly, especially if you are an older patient. Alcohol may increase dizziness and drowsiness. Avoid alcoholic drinks. Do not treat yourself for coughs, colds or allergies without asking your doctor or health care professional for advice. Some ingredients can increase  possible side effects. What side effects may I notice from receiving this medicine? Side effects that you should report to  your doctor or health care professional as soon as possible: -allergic reactions like skin rash, itching or hives, swelling of the face, lips, or tongue -angry, confused, depressed, other mood changes -breathing problems -feeling faint or lightheaded, falls -muscle cramps -problems with balance, talking, walking -restlessness -tremors -trouble passing urine or change in the amount of urine -unusually weak or tired Side effects that usually do not require medical attention (report to your doctor or health care professional if they continue or are bothersome): -difficulty sleeping, nightmares -dizziness, drowsiness, clumsiness, or unsteadiness, a hangover effect -headache -nausea, vomiting This list may not describe all possible side effects. Call your doctor for medical advice about side effects. You may report side effects to FDA at 1-800-FDA-1088. Where should I keep my medicine? Keep out of the reach of children. This medicine can be abused. Keep your medicine in a safe place to protect it from theft. Do not share this medicine with anyone. Selling or giving away this medicine is dangerous and against the law. Store at room temperature between 15 and 30 degrees C (59 and 86 degrees F). Protect from light. Keep container tightly closed. Throw away any unused medicine after the expiration date. NOTE: This sheet is a summary. It may not cover all possible information. If you have questions about this medicine, talk to your doctor, pharmacist, or health care provider.  2014, [employer]/Gold Standard. (2007-06-02 16:57:35)  Acetaminophen; Oxycodone tablets What is this medicine? ACETAMINOPHEN; OXYCODONE (a set a MEE noe fen; ox i KOE done) is a pain reliever. It is used to treat mild to moderate pain. This medicine may be used for other purposes; ask your health care provider or pharmacist if you have questions. COMMON BRAND NAME(S): Endocet, Magnacet, Narvox, Percocet, Perloxx, Primalev,  Primlev, Roxicet, Xolox What should I tell my health care provider before I take this medicine? They need to know if you have any of these conditions: -brain tumor -Crohn's disease, inflammatory bowel disease, or ulcerative colitis -drug abuse or addiction -head injury -heart or circulation problems -if you often drink alcohol -kidney disease or problems going to the bathroom -liver disease -lung disease, asthma, or breathing problems -an unusual or allergic reaction to acetaminophen, oxycodone, other opioid analgesics, other medicines, foods, dyes, or preservatives -pregnant or trying to get pregnant -breast-feeding How should I use this medicine? Take this medicine by mouth with a full glass of water. Follow the directions on the prescription label. Take your medicine at regular intervals. Do not take your medicine more often than directed. Talk to your pediatrician regarding the use of this medicine in children. Special care may be needed. Patients over 51 years old may have a stronger reaction and need a smaller dose. Overdosage: If you think you have taken too much of this medicine contact a poison control center or emergency room at once. NOTE: This medicine is only for you. Do not share this medicine with others. What if I miss a dose? If you miss a dose, take it as soon as you can. If it is almost time for your next dose, take only that dose. Do not take double or extra doses. What may interact with this medicine? -alcohol -antihistamines -barbiturates like amobarbital, butalbital, butabarbital, methohexital, pentobarbital, phenobarbital, thiopental, and secobarbital -benztropine -drugs for bladder problems like solifenacin, trospium, oxybutynin, tolterodine, hyoscyamine, and methscopolamine -drugs for breathing problems like ipratropium and tiotropium -  drugs for certain stomach or intestine problems like propantheline, homatropine methylbromide, glycopyrrolate, atropine,  belladonna, and dicyclomine -general anesthetics like etomidate, ketamine, nitrous oxide, propofol, desflurane, enflurane, halothane, isoflurane, and sevoflurane -medicines for depression, anxiety, or psychotic disturbances -medicines for sleep -muscle relaxants -naltrexone -narcotic medicines (opiates) for pain -phenothiazines like perphenazine, thioridazine, chlorpromazine, mesoridazine, fluphenazine, prochlorperazine, promazine, and trifluoperazine -scopolamine -tramadol -trihexyphenidyl This list may not describe all possible interactions. Give your health care provider a list of all the medicines, herbs, non-prescription drugs, or dietary supplements you use. Also tell them if you smoke, drink alcohol, or use illegal drugs. Some items may interact with your medicine. What should I watch for while using this medicine? Tell your doctor or health care professional if your pain does not go away, if it gets worse, or if you have new or a different type of pain. You may develop tolerance to the medicine. Tolerance means that you will need a higher dose of the medication for pain relief. Tolerance is normal and is expected if you take this medicine for a long time. Do not suddenly stop taking your medicine because you may develop a severe reaction. Your body becomes used to the medicine. This does NOT mean you are addicted. Addiction is a behavior related to getting and using a drug for a non-medical reason. If you have pain, you have a medical reason to take pain medicine. Your doctor will tell you how much medicine to take. If your doctor wants you to stop the medicine, the dose will be slowly lowered over time to avoid any side effects. You may get drowsy or dizzy. Do not drive, use machinery, or do anything that needs mental alertness until you know how this medicine affects you. Do not stand or sit up quickly, especially if you are an older patient. This reduces the risk of dizzy or fainting  spells. Alcohol may interfere with the effect of this medicine. Avoid alcoholic drinks. There are different types of narcotic medicines (opiates) for pain. If you take more than one type at the same time, you may have more side effects. Give your health care provider a list of all medicines you use. Your doctor will tell you how much medicine to take. Do not take more medicine than directed. Call emergency for help if you have problems breathing. The medicine will cause constipation. Try to have a bowel movement at least every 2 to 3 days. If you do not have a bowel movement for 3 days, call your doctor or health care professional. Do not take Tylenol (acetaminophen) or medicines that have acetaminophen with this medicine. Too much acetaminophen can be very dangerous. Many nonprescription medicines contain acetaminophen. Always read the labels carefully to avoid taking more acetaminophen. What side effects may I notice from receiving this medicine? Side effects that you should report to your doctor or health care professional as soon as possible: -allergic reactions like skin rash, itching or hives, swelling of the face, lips, or tongue -breathing difficulties, wheezing -confusion -light headedness or fainting spells -severe stomach pain -unusually weak or tired -yellowing of the skin or the whites of the eyes  Side effects that usually do not require medical attention (report to your doctor or health care professional if they continue or are bothersome): -dizziness -drowsiness -nausea -vomiting This list may not describe all possible side effects. Call your doctor for medical advice about side effects. You may report side effects to FDA at 1-800-FDA-1088. Where should I keep my medicine? Keep  out of the reach of children. This medicine can be abused. Keep your medicine in a safe place to protect it from theft. Do not share this medicine with anyone. Selling or giving away this medicine is  dangerous and against the law. Store at room temperature between 20 and 25 degrees C (68 and 77 degrees F). Keep container tightly closed. Protect from light. This medicine may cause accidental overdose and death if it is taken by other adults, children, or pets. Flush any unused medicine down the toilet to reduce the chance of harm. Do not use the medicine after the expiration date. NOTE: This sheet is a summary. It may not cover all possible information. If you have questions about this medicine, talk to your doctor, pharmacist, or health care provider.  2014, [employer] Standard. (2012-10-06 13:17:35)

## 2013-04-30 MED FILL — Oxycodone w/ Acetaminophen Tab 5-325 MG: ORAL | Qty: 6 | Status: AC

## 2013-12-28 ENCOUNTER — Encounter (HOSPITAL_COMMUNITY): Payer: Self-pay | Admitting: Emergency Medicine

## 2014-01-03 ENCOUNTER — Emergency Department (HOSPITAL_COMMUNITY)
Admission: EM | Admit: 2014-01-03 | Discharge: 2014-01-03 | Disposition: A | Discharge status: Home / Self Care | Payer: Managed Care, Other (non HMO) | Attending: Emergency Medicine | Admitting: Emergency Medicine

## 2014-01-03 ENCOUNTER — Encounter (HOSPITAL_COMMUNITY): Payer: Self-pay | Admitting: Emergency Medicine

## 2014-01-03 DIAGNOSIS — Z720 Tobacco use: Secondary | ICD-10-CM | POA: Insufficient documentation

## 2014-01-03 DIAGNOSIS — Z87828 Personal history of other (healed) physical injury and trauma: Secondary | ICD-10-CM | POA: Insufficient documentation

## 2014-01-03 DIAGNOSIS — R42 Dizziness and giddiness: Secondary | ICD-10-CM | POA: Diagnosis not present

## 2014-01-03 DIAGNOSIS — Z8742 Personal history of other diseases of the female genital tract: Secondary | ICD-10-CM | POA: Insufficient documentation

## 2014-01-03 DIAGNOSIS — Z791 Long term (current) use of non-steroidal anti-inflammatories (NSAID): Secondary | ICD-10-CM | POA: Insufficient documentation

## 2014-01-03 DIAGNOSIS — Z3202 Encounter for pregnancy test, result negative: Secondary | ICD-10-CM | POA: Diagnosis not present

## 2014-01-03 DIAGNOSIS — R5383 Other fatigue: Secondary | ICD-10-CM | POA: Insufficient documentation

## 2014-01-03 DIAGNOSIS — Z79899 Other long term (current) drug therapy: Secondary | ICD-10-CM | POA: Diagnosis not present

## 2014-01-03 DIAGNOSIS — G8929 Other chronic pain: Secondary | ICD-10-CM | POA: Insufficient documentation

## 2014-01-03 DIAGNOSIS — Z792 Long term (current) use of antibiotics: Secondary | ICD-10-CM | POA: Insufficient documentation

## 2014-01-03 DIAGNOSIS — R531 Weakness: Secondary | ICD-10-CM | POA: Diagnosis present

## 2014-01-03 LAB — CBC WITH DIFFERENTIAL/PLATELET
BASOS ABS: 0.1 10*3/uL (ref 0.0–0.1)
BASOS PCT: 1 % (ref 0–1)
EOS PCT: 0 % (ref 0–5)
Eosinophils Absolute: 0 10*3/uL (ref 0.0–0.7)
HEMATOCRIT: 37.3 % (ref 36.0–46.0)
Hemoglobin: 12.9 g/dL (ref 12.0–15.0)
Lymphocytes Relative: 19 % (ref 12–46)
Lymphs Abs: 1.5 10*3/uL (ref 0.7–4.0)
MCH: 32.1 pg (ref 26.0–34.0)
MCHC: 34.6 g/dL (ref 30.0–36.0)
MCV: 92.8 fL (ref 78.0–100.0)
MONO ABS: 0.6 10*3/uL (ref 0.1–1.0)
Monocytes Relative: 7 % (ref 3–12)
Neutro Abs: 6 10*3/uL (ref 1.7–7.7)
Neutrophils Relative %: 73 % (ref 43–77)
Platelets: 217 10*3/uL (ref 150–400)
RBC: 4.02 MIL/uL (ref 3.87–5.11)
RDW: 11.6 % (ref 11.5–15.5)
WBC: 8.1 10*3/uL (ref 4.0–10.5)

## 2014-01-03 LAB — URINALYSIS, ROUTINE W REFLEX MICROSCOPIC
Bilirubin Urine: NEGATIVE
Glucose, UA: NEGATIVE mg/dL
Hgb urine dipstick: NEGATIVE
Ketones, ur: NEGATIVE mg/dL
LEUKOCYTES UA: NEGATIVE
Nitrite: NEGATIVE
PROTEIN: NEGATIVE mg/dL
SPECIFIC GRAVITY, URINE: 1.01 (ref 1.005–1.030)
UROBILINOGEN UA: 0.2 mg/dL (ref 0.0–1.0)
pH: 7 (ref 5.0–8.0)

## 2014-01-03 LAB — COMPREHENSIVE METABOLIC PANEL
ALBUMIN: 3.8 g/dL (ref 3.5–5.2)
ALT: 15 U/L (ref 0–35)
ANION GAP: 10 (ref 5–15)
AST: 13 U/L (ref 0–37)
Alkaline Phosphatase: 41 U/L (ref 39–117)
BUN: 17 mg/dL (ref 6–23)
CALCIUM: 9.3 mg/dL (ref 8.4–10.5)
CO2: 25 mEq/L (ref 19–32)
CREATININE: 0.8 mg/dL (ref 0.50–1.10)
Chloride: 103 mEq/L (ref 96–112)
GFR calc Af Amer: >90 mL/min (ref >90)
Glucose, Bld: 100 mg/dL — ABNORMAL HIGH (ref 70–99)
Potassium: 3.9 mEq/L (ref 3.7–5.3)
Sodium: 138 mEq/L (ref 137–147)
Total Bilirubin: 0.4 mg/dL (ref 0.3–1.2)
Total Protein: 7.4 g/dL (ref 6.0–8.3)

## 2014-01-03 LAB — CBG MONITORING, ED: Glucose-Capillary: 91 mg/dL (ref 70–99)

## 2014-01-03 LAB — PREGNANCY, URINE: PREG TEST UR: NEGATIVE

## 2014-01-03 LAB — D-DIMER, QUANTITATIVE (NOT AT ARMC)

## 2014-01-03 MED ORDER — MECLIZINE HCL 12.5 MG PO TABS
25.0000 mg | ORAL_TABLET | Freq: Once | ORAL | Status: AC
  Administered 2014-01-03: 25 mg via ORAL
  Filled 2014-01-03: qty 2

## 2014-01-03 MED ORDER — MECLIZINE HCL 25 MG PO TABS: 25.0000 mg | ORAL_TABLET | Freq: Four times a day (QID) | ORAL | Status: DC | PRN

## 2014-01-03 NOTE — ED Notes (Signed)
Patient ambulated around nurses station back to room.

## 2014-01-03 NOTE — ED Notes (Signed)
cbg in triage 91.

## 2014-01-03 NOTE — Discharge Instructions (Signed)
Recheck if you get weakness in your feet, if you develop foot drop or lose reflexes in your lower legs which would suggest guillain barre syndrome. Take the meclizine for weakness and dizziness.  Recheck if you feel worse.

## 2014-01-03 NOTE — ED Notes (Signed)
Pt with dizziness with sitting up, will check later

## 2014-01-03 NOTE — ED Notes (Signed)
Pt reports started augmentin a few days ago. Pt reports ever since has felt "drained." pt reports felt lightheaded this am in church. nad noted in triage.

## 2014-01-03 NOTE — ED Notes (Signed)
Pt has been on Augmentin for 6 days for sinus infection but today had lightheadedness and tingling and heaviness to arms and legs, pt admits to taking Flu shot on Friday

## 2014-01-03 NOTE — ED Notes (Signed)
MD at bedside. 

## 2014-01-03 NOTE — ED Provider Notes (Addendum)
CSN: [id number]     Arrival date & time 01/03/14  1125 History   This chart was scribed for Ward GivensIva L Irianna Gilday, MD by [place] Peace, ED Scribe. The patient was seen in APA03/APA03. The patient's care was started at 2:22 PM.      Chief Complaint  Patient presents with  . Weakness      Patient is a 28 y.o. female presenting with weakness. The history is provided by the patient. No language interpreter was used.  Weakness Pertinent negatives include no chest pain.  HPI Comments: Rachel Griffith is a 28 y.o. female who presents to the Emergency Department complaining of weakness onset 11:00 AM this morning while at church and pt was singing. She states she had only been standing a few moments. The church was not hot.Pt states that she felt as if she is "drained"and states she feels when she stands up that her body is draining to the floor.. Pt reports that she felt lightheaded initially but sat down for a couple minutes before standing up again.she states standing up or laying down does not improve the symptoms. Moderate fatigue over the past few days. Pt reports her arms and legs feel weak. Numbness and tingling currently around her mouth and her whole body. She denies any trouble walking up or down stairs.    No complaints of nausea,vomiting , diarrhea, chest pain, abdominal pain,or fever. Pt reports she started Augmentin one week ago to treat sinus symptoms with rhinorrhea, fever, and cough that she had had for 2 weeks before. She reports all those symptoms have been improving.  Pt just got flu shot on Friday, 2 days ago. She has gotten flu shots before. She denies any significant symptoms after the police shot such as fevers or achiness. She had minimal discomfort at the injection site. There is no family history of adverse reactions to the flu shot. She denies being on any birth control pills. She states her last normal period was a few days ago.  PCP [facility] in [place]  Past Medical History  Diagnosis  Date  . Endometriosis   . Ovarian cyst   . Chronic pelvic pain in female   . Back injury    Past Surgical History  Procedure Laterality Date  . Knee surgery     History reviewed. No pertinent family history. History  Substance Use Topics  . Smoking status: Current Every Day Smoker  . Smokeless tobacco: Not on file  . Alcohol Use: Yes     Comment: occ   Employed as a respiratory therapist at [employer] Smokes 1-2 cigs a day   OB History    Gravida Para Term Preterm AB TAB SAB Ectopic Multiple Living   2 2 2             Review of Systems  Constitutional: Positive for fatigue. Negative for fever.  Cardiovascular: Negative for chest pain.  Gastrointestinal: Negative for vomiting and diarrhea.  Neurological: Positive for weakness, light-headedness and numbness.  All other systems reviewed and are negative.     Allergies  Sulfa antibiotics  Home Medications   Prior to Admission medications   Medication Sig Start Date End Date Taking? Authorizing Provider  amoxicillin-clavulanate (AUGMENTIN) 875-125 MG per tablet Take 1 tablet by mouth 2 (two) times daily.   Yes Historical Provider, MD  gabapentin (NEURONTIN) 300 MG capsule Take 300 mg by mouth 3 (three) times daily.   Yes Historical Provider, MD  guaiFENesin (MUCINEX) 600 MG 12 hr tablet  Take 600 mg by mouth 2 (two) times daily.   Yes Historical Provider, MD  ibuprofen (ADVIL,MOTRIN) 200 MG tablet Take 800 mg by mouth every 8 (eight) hours as needed for pain.    Yes Historical Provider, MD  Multiple Vitamin (MULTIVITAMIN WITH MINERALS) TABS tablet Take 1 tablet by mouth daily.   Yes Historical Provider, MD  HYDROcodone-acetaminophen (NORCO/VICODIN) 5-325 MG per tablet 1 or 2 tabs PO q6 hours prn pain Patient not taking: Reported on 01/03/2014 05/08/12   Samuel Jester, DO  meclizine (ANTIVERT) 25 MG tablet Take 1 tablet (25 mg total) by mouth 4 (four) times daily as needed for dizziness (weakness). 01/03/14   Ward Givens, MD  metroNIDAZOLE (FLAGYL) 500 MG tablet Take 1 tablet (500 mg total) by mouth 2 (two) times daily. Patient not taking: Reported on 01/03/2014 05/08/12   Samuel Jester, DO  naproxen (NAPROSYN) 250 MG tablet Take 1 tablet (250 mg total) by mouth 2 (two) times daily with a meal. Patient not taking: Reported on 01/03/2014 05/08/12   Samuel Jester, DO  oxyCODONE-acetaminophen (PERCOCET/ROXICET) 5-325 MG per tablet Take 1 tablet by mouth every 4 (four) hours as needed. Patient not taking: Reported on 01/03/2014 04/24/13   Dione Booze, MD  oxyCODONE-acetaminophen (PERCOCET/ROXICET) 5-325 MG per tablet Take 1 tablet by mouth every 4 (four) hours as needed for severe pain. Patient not taking: Reported on 01/03/2014 04/24/13   Dione Booze, MD   BP 117/54 mmHg  Pulse 84  Temp(Src) 98.9 F (37.2 C) (Oral)  Resp 18  Ht 5\' 8"  (1.727 m)  Wt 153 lb (69.4 kg)  BMI 23.27 kg/m2  SpO2 100%  LMP 12/29/2013  Vital signs normal   13:04 Orthostatic Vital Signs AC  Orthostatic Lying  - BP- Lying: 111/71 mmHg ; Pulse- Lying: 108  Orthostatic Sitting - BP- Sitting: 108/68 mmHg ; Pulse- Sitting: 73  Orthostatic Standing at 0 minutes - BP- Standing at 0 minutes: 107/71 mmHg ; Pulse- Standing at 0 minutes: 83   Orthostatics without abnormality   Physical Exam  Constitutional: She is oriented to person, place, and time. She appears well-developed and well-nourished.  Non-toxic appearance. She does not appear ill. No distress.  HENT:  Head: Normocephalic and atraumatic.  Right Ear: External ear normal.  Left Ear: External ear normal.  Nose: Nose normal. No mucosal edema or rhinorrhea.  Mouth/Throat: Oropharynx is clear and moist and mucous membranes are normal. No dental abscesses or uvula swelling.  Eyes: Conjunctivae and EOM are normal. Pupils are equal, round, and reactive to light.  Neck: Normal range of motion and full passive range of motion without pain. Neck supple.  Cardiovascular: Normal  rate, regular rhythm and normal heart sounds.  Exam reveals no gallop and no friction rub.   No murmur heard. Pulmonary/Chest: Effort normal and breath sounds normal. No respiratory distress. She has no wheezes. She has no rhonchi. She has no rales. She exhibits no tenderness and no crepitus.  Abdominal: Soft. Normal appearance and bowel sounds are normal. She exhibits no distension. There is no tenderness. There is no rebound and no guarding.  Musculoskeletal: Normal range of motion. She exhibits no edema or tenderness.  Moves all extremities well.   Neurological: She is alert and oriented to person, place, and time. She has normal strength. No cranial nerve deficit.  Reflex Scores:      Brachioradialis reflexes are 2+ on the right side and 2+ on the left side.      Patellar reflexes  are 3+ on the right side and 3+ on the left side.      Achilles reflexes are 0 on the right side and 0 on the left side. Finger to nose intact. Grips mildly weak on left. Pt right handed. No pronator drift. Mild tremor of right hand. No motor weakness. Reflexes: 3+ patellar bilaterally, 2+ brachioradialis, 2+ radial, achilles 0 bilaterally (with stethoscope).  Skin: Skin is warm, dry and intact. No rash noted. No erythema. No pallor.  Psychiatric: She has a normal mood and affect. Her speech is normal and behavior is normal. Her mood appears not anxious.  Nursing note and vitals reviewed.   ED Course  Procedures (including critical care time)  Medications  meclizine (ANTIVERT) tablet 25 mg (25 mg Oral Given 01/03/14 1536)   Pt has just voided and we needed a UA and pregnancy test. Pt is concerned she is having guillain barre from her flu shot.   15: 20 Pt ambulated in her room, she states she feels weak but her gait is normal. She is able to squat without difficulty.   15:51 discussed with Dr Roseanne Reno, neurology, feels without more objective physical findings such as difficulty walking, foot drop,  LP not  indicated at this time to look for guillain barre, feels the onset is too close after the flu vaccine.  Pt reexamined, her socks were removed and I used a reflex hammer and she does have intact achilles reflexes bilaterally. We discussed trying meclizine to see if that will help her symptoms.   Recheck at 17:20 the meclizine has helped, still feels weak.    Labs Review Results for orders placed or performed during the hospital encounter of 01/03/14  CBC with Differential  Result Value Ref Range   WBC 8.1 4.0 - 10.5 K/uL   RBC 4.02 3.87 - 5.11 MIL/uL   Hemoglobin 12.9 12.0 - 15.0 g/dL   HCT 16.1 09.6 - 04.5 %   MCV 92.8 78.0 - 100.0 fL   MCH 32.1 26.0 - 34.0 pg   MCHC 34.6 30.0 - 36.0 g/dL   RDW 40.9 81.1 - 91.4 %   Platelets 217 150 - 400 K/uL   Neutrophils Relative % 73 43 - 77 %   Neutro Abs 6.0 1.7 - 7.7 K/uL   Lymphocytes Relative 19 12 - 46 %   Lymphs Abs 1.5 0.7 - 4.0 K/uL   Monocytes Relative 7 3 - 12 %   Monocytes Absolute 0.6 0.1 - 1.0 K/uL   Eosinophils Relative 0 0 - 5 %   Eosinophils Absolute 0.0 0.0 - 0.7 K/uL   Basophils Relative 1 0 - 1 %   Basophils Absolute 0.1 0.0 - 0.1 K/uL  Comprehensive metabolic panel  Result Value Ref Range   Sodium 138 137 - 147 mEq/L   Potassium 3.9 3.7 - 5.3 mEq/L   Chloride 103 96 - 112 mEq/L   CO2 25 19 - 32 mEq/L   Glucose, Bld 100 (H) 70 - 99 mg/dL   BUN 17 6 - 23 mg/dL   Creatinine, Ser 7.82 0.50 - 1.10 mg/dL   Calcium 9.3 8.4 - 95.6 mg/dL   Total Protein 7.4 6.0 - 8.3 g/dL   Albumin 3.8 3.5 - 5.2 g/dL   AST 13 0 - 37 U/L   ALT 15 0 - 35 U/L   Alkaline Phosphatase 41 39 - 117 U/L   Total Bilirubin 0.4 0.3 - 1.2 mg/dL   GFR calc non Af Amer >90 >90 mL/min  GFR calc Af Amer >90 >90 mL/min   Anion gap 10 5 - 15  Urinalysis, Routine w reflex microscopic  Result Value Ref Range   Color, Urine YELLOW YELLOW   APPearance CLEAR CLEAR   Specific Gravity, Urine 1.010 1.005 - 1.030   pH 7.0 5.0 - 8.0   Glucose, UA NEGATIVE  NEGATIVE mg/dL   Hgb urine dipstick NEGATIVE NEGATIVE   Bilirubin Urine NEGATIVE NEGATIVE   Ketones, ur NEGATIVE NEGATIVE mg/dL   Protein, ur NEGATIVE NEGATIVE mg/dL   Urobilinogen, UA 0.2 0.0 - 1.0 mg/dL   Nitrite NEGATIVE NEGATIVE   Leukocytes, UA NEGATIVE NEGATIVE  Pregnancy, urine  Result Value Ref Range   Preg Test, Ur NEGATIVE NEGATIVE  D-dimer, quantitative  Result Value Ref Range   D-Dimer, Quant <0.27 0.00 - 0.48 ug/mL-FEU  CBG, ED  Result Value Ref Range   Glucose-Capillary 91 70 - 99 mg/dL   Laboratory interpretation all normal     Imaging Review No results found.   EKG Interpretation   Date/Time:  Sunday January 03 2014 14:43:00 EST Ventricular Rate:  54 PR Interval:  133 QRS Duration: 89 QT Interval:  404 QTC Calculation: 383 R Axis:   63 Text Interpretation:  Sinus rhythm No st or t changes no ectopy no  comparison EKG Confirmed by Fayrene FearingJAMES  MD, MARK ([id number]) on 01/03/2014 2:48:39  PM         MDM   Final diagnoses:  Weakness   New Prescriptions   MECLIZINE (ANTIVERT) 25 MG TABLET    Take 1 tablet (25 mg total) by mouth 4 (four) times daily as needed for dizziness (weakness).    Plan discharge    I personally performed the services described in this documentation, which was scribed in my presence. The recorded information has been reviewed and considered.  Devoria AlbeIva Braddock Servellon, MD, FACEP   Ward GivensIva L Anton Cheramie, MD 01/03/14 1740  Ward GivensIva L Tanvir Hipple, MD 01/03/14 [phone]

## 2014-04-04 ENCOUNTER — Encounter (HOSPITAL_COMMUNITY): Payer: Self-pay

## 2014-04-04 ENCOUNTER — Emergency Department (HOSPITAL_COMMUNITY)
Admission: EM | Admit: 2014-04-04 | Discharge: 2014-04-04 | Disposition: A | Discharge status: Home / Self Care | Payer: Managed Care, Other (non HMO) | Attending: Emergency Medicine | Admitting: Emergency Medicine

## 2014-04-04 DIAGNOSIS — Z8742 Personal history of other diseases of the female genital tract: Secondary | ICD-10-CM | POA: Insufficient documentation

## 2014-04-04 DIAGNOSIS — R51 Headache: Secondary | ICD-10-CM | POA: Insufficient documentation

## 2014-04-04 DIAGNOSIS — Z87828 Personal history of other (healed) physical injury and trauma: Secondary | ICD-10-CM | POA: Diagnosis not present

## 2014-04-04 DIAGNOSIS — G8929 Other chronic pain: Secondary | ICD-10-CM | POA: Insufficient documentation

## 2014-04-04 DIAGNOSIS — Z792 Long term (current) use of antibiotics: Secondary | ICD-10-CM | POA: Diagnosis not present

## 2014-04-04 DIAGNOSIS — Z720 Tobacco use: Secondary | ICD-10-CM | POA: Diagnosis not present

## 2014-04-04 DIAGNOSIS — Z760 Encounter for issue of repeat prescription: Secondary | ICD-10-CM

## 2014-04-04 DIAGNOSIS — Z79899 Other long term (current) drug therapy: Secondary | ICD-10-CM | POA: Diagnosis not present

## 2014-04-04 MED ORDER — AMITRIPTYLINE HCL 25 MG PO TABS: 25.0000 mg | ORAL_TABLET | Freq: Every day | ORAL | Status: DC

## 2014-04-04 MED ORDER — BUTALBITAL-APAP-CAFFEINE 50-500-40 MG PO TABS: 1.0000 | ORAL_TABLET | ORAL | Status: DC | PRN

## 2014-04-04 NOTE — Discharge Instructions (Signed)
Medication Refill, Emergency Department °We have refilled your medication today as a courtesy to you. It is best for your medical care, however, to take care of getting refills done through your primary caregiver's office. They have your records and can do a better job of follow-up than we can in the emergency department. °On maintenance medications, we often only prescribe enough medications to get you by until you are able to see your regular caregiver. This is a more expensive way to refill medications. °In the future, please plan for refills so that you will not have to use the emergency department for this. °Thank you for your help. Your help allows us to better take care of the daily emergencies that enter our department. °Document Released: 06/01/2003 Document Revised: 05/07/2011 Document Reviewed: 05/22/2013 °Exit[employer]® Patient Information ©2015 [employer]. This information is not intended to replace advice given to you by your health care provider. Make sure you discuss any questions you have with your health care provider. ° °

## 2014-04-04 NOTE — ED Notes (Signed)
Pt reports has PTSD and has recently started therapy.  Reports didn't realize she was going to run out of meds until yesterday.  Pt says can get her medication from her doctor Monday but says unable to get through today without her meds.  Reports has chronic headaches and takes fioricet during the day and amitriptyline at night.   Denies any SI or HI.

## 2014-04-04 NOTE — ED Provider Notes (Signed)
CSN: [id number]     Arrival date & time 04/04/14  0917 History   First MD Initiated Contact with Patient 04/04/14 [phone]     This chart was scribed for Geoffery Lyonsouglas Marciano Mundt, MD by Arlan OrganAshley Leger, ED Scribe. This patient was seen in room APA19/APA19 and the patient's care was started 9:33 AM.   Chief Complaint  Patient presents with  . Medication Refill   The history is provided by the patient. No language interpreter was used.    HPI Comments: Rachel Griffith is a 29 y.o. female who presents to the Emergency Department here for a medication refill this morning. Pt is out of medication for Amitriptyline and Fioricet and states she was not aware she did not have enough tabs to get her through. Ms. Valentina LucksGriffin plans to contact her doctor on Monday to get a prescription refill. She admits to ongoing, constant, moderate HA chronic in nature. No other associated symptoms at this time. She denies any SI/HI. Pt with known allergies to Sulfa antibiotics.  Past Medical History  Diagnosis Date  . Endometriosis   . Ovarian cyst   . Chronic pelvic pain in female   . Back injury    Past Surgical History  Procedure Laterality Date  . Knee surgery     No family history on file. History  Substance Use Topics  . Smoking status: Current Every Day Smoker  . Smokeless tobacco: Not on file  . Alcohol Use: Yes     Comment: occ   OB History    Gravida Para Term Preterm AB TAB SAB Ectopic Multiple Living   2 2 2             Review of Systems  Neurological: Positive for headaches.  All other systems reviewed and are negative.     Allergies  Sulfa antibiotics  Home Medications   Prior to Admission medications   Medication Sig Start Date End Date Taking? Authorizing Provider  amoxicillin-clavulanate (AUGMENTIN) 875-125 MG per tablet Take 1 tablet by mouth 2 (two) times daily.    Historical Provider, MD  gabapentin (NEURONTIN) 300 MG capsule Take 300 mg by mouth 3 (three) times daily.    Historical  Provider, MD  guaiFENesin (MUCINEX) 600 MG 12 hr tablet Take 600 mg by mouth 2 (two) times daily.    Historical Provider, MD  HYDROcodone-acetaminophen (NORCO/VICODIN) 5-325 MG per tablet 1 or 2 tabs PO q6 hours prn pain Patient not taking: Reported on 01/03/2014 05/08/12   Samuel JesterKathleen McManus, DO  ibuprofen (ADVIL,MOTRIN) 200 MG tablet Take 800 mg by mouth every 8 (eight) hours as needed for pain.     Historical Provider, MD  meclizine (ANTIVERT) 25 MG tablet Take 1 tablet (25 mg total) by mouth 4 (four) times daily as needed for dizziness (weakness). 01/03/14   Ward GivensIva L Knapp, MD  metroNIDAZOLE (FLAGYL) 500 MG tablet Take 1 tablet (500 mg total) by mouth 2 (two) times daily. Patient not taking: Reported on 01/03/2014 05/08/12   Samuel JesterKathleen McManus, DO  Multiple Vitamin (MULTIVITAMIN WITH MINERALS) TABS tablet Take 1 tablet by mouth daily.    Historical Provider, MD  naproxen (NAPROSYN) 250 MG tablet Take 1 tablet (250 mg total) by mouth 2 (two) times daily with a meal. Patient not taking: Reported on 01/03/2014 05/08/12   Samuel JesterKathleen McManus, DO  oxyCODONE-acetaminophen (PERCOCET/ROXICET) 5-325 MG per tablet Take 1 tablet by mouth every 4 (four) hours as needed. Patient not taking: Reported on 01/03/2014 04/24/13   Dione Boozeavid Glick, MD  oxyCODONE-acetaminophen (  PERCOCET/ROXICET) 5-325 MG per tablet Take 1 tablet by mouth every 4 (four) hours as needed for severe pain. Patient not taking: Reported on 01/03/2014 04/24/13   Dione Booze, MD   Triage Vitals: BP 109/66 mmHg  Pulse 56  Temp(Src) 97.7 F (36.5 C) (Oral)  Resp 18  Ht  (1.727 m)  Wt 145 lb (65.772 kg)  BMI 22.05 kg/m2  SpO2 100%  LMP 03/29/2014   Physical Exam  Constitutional: She is oriented to person, place, and time. She appears well-developed and well-nourished.  HENT:  Head: Normocephalic.  Eyes: Conjunctivae and EOM are normal. Pupils are equal, round, and reactive to light.  Neck: Normal range of motion.  Pulmonary/Chest: Effort normal.   Abdominal: She exhibits no distension.  Musculoskeletal: Normal range of motion.  Neurological: She is alert and oriented to person, place, and time.  Cranial nerves 2-12 intact  Psychiatric: She has a normal mood and affect.  Nursing note and vitals reviewed.   ED Course  Procedures (including critical care time)  DIAGNOSTIC STUDIES: Oxygen Saturation is 100% on RA, Normal by my interpretation.    COORDINATION OF CARE: 9:37 AM- Will send home with short course prescriptions for requested medications. Discussed treatment plan with pt at bedside and pt agreed to plan.     Labs Review Labs Reviewed - No data to display  Imaging Review No results found.   EKG Interpretation None      MDM   Final diagnoses:  None    I have agreed to provide this patient with a small quantity of her amitriptyline and Fioricet until she can follow-up with her doctor tomorrow. She was advised that future refills need to come from her primary care physician.  I personally performed the services described in this documentation, which was scribed in my presence. The recorded information has been reviewed and is accurate.      Geoffery Lyons, MD 04/04/14 [phone]

## 2014-10-06 ENCOUNTER — Encounter: Payer: Self-pay | Admitting: Adult Health

## 2014-10-06 ENCOUNTER — Ambulatory Visit (INDEPENDENT_AMBULATORY_CARE_PROVIDER_SITE_OTHER): Payer: Managed Care, Other (non HMO) | Admitting: Adult Health

## 2014-10-06 VITALS — BP 90/50 | HR 64 | Ht 69.0 in | Wt 143.0 lb

## 2014-10-06 DIAGNOSIS — N809 Endometriosis, unspecified: Secondary | ICD-10-CM | POA: Diagnosis not present

## 2014-10-06 DIAGNOSIS — R102 Pelvic and perineal pain: Secondary | ICD-10-CM | POA: Insufficient documentation

## 2014-10-06 HISTORY — DX: Endometriosis, unspecified: N80.9

## 2014-10-06 HISTORY — DX: Pelvic and perineal pain: R10.2

## 2014-10-06 MED ORDER — HYDROCODONE-ACETAMINOPHEN 5-325 MG PO TABS: 1.0000 | ORAL_TABLET | Freq: Four times a day (QID) | ORAL | Status: DC | PRN

## 2014-10-06 MED ORDER — MEGESTROL ACETATE 40 MG PO TABS: ORAL_TABLET | ORAL | Status: DC

## 2014-10-06 NOTE — Patient Instructions (Signed)
Take megace Return in 1 week for pre op  Hysterectomy Information  A hysterectomy is a surgery in which your uterus is removed. This surgery may be done to treat various medical problems. After the surgery, you will no longer have menstrual periods. The surgery will also make you unable to become pregnant (sterile). The fallopian tubes and ovaries can be removed (bilateral salpingo-oophorectomy) during this surgery as well.  REASONS FOR A HYSTERECTOMY  Persistent, abnormal bleeding.  Lasting (chronic) pelvic pain or infection.  The lining of the uterus (endometrium) starts growing outside the uterus (endometriosis).  The endometrium starts growing in the muscle of the uterus (adenomyosis).  The uterus falls down into the vagina (pelvic organ prolapse).  Noncancerous growths in the uterus (uterine fibroids) that cause symptoms.  Precancerous cells.  Cervical cancer or uterine cancer. TYPES OF HYSTERECTOMIES  Supracervical hysterectomy--In this type, the top part of the uterus is removed, but not the cervix.  Total hysterectomy--The uterus and cervix are removed.  Radical hysterectomy--The uterus, the cervix, and the fibrous tissue that holds the uterus in place in the pelvis (parametrium) are removed. WAYS A HYSTERECTOMY CAN BE PERFORMED  Abdominal hysterectomy--A large surgical cut (incision) is made in the abdomen. The uterus is removed through this incision.  Vaginal hysterectomy--An incision is made in the vagina. The uterus is removed through this incision. There are no abdominal incisions.  Conventional laparoscopic hysterectomy--Three or four small incisions are made in the abdomen. A thin, lighted tube with a camera (laparoscope) is inserted into one of the incisions. Other tools are put through the other incisions. The uterus is cut into small pieces. The small pieces are removed through the incisions, or they are removed through the vagina.  Laparoscopically assisted  vaginal hysterectomy (LAVH)--Three or four small incisions are made in the abdomen. Part of the surgery is performed laparoscopically and part vaginally. The uterus is removed through the vagina.  Robot-assisted laparoscopic hysterectomy--A laparoscope and other tools are inserted into 3 or 4 small incisions in the abdomen. A computer-controlled device is used to give the surgeon a 3D image and to help control the surgical instruments. This allows for more precise movements of surgical instruments. The uterus is cut into small pieces and removed through the incisions or removed through the vagina. RISKS AND COMPLICATIONS  Possible complications associated with this procedure include:  Bleeding and risk of blood transfusion. Tell your health care provider if you do not want to receive any blood products.  Blood clots in the legs or lung.  Infection.  Injury to surrounding organs.  Problems or side effects related to anesthesia.  Conversion to an abdominal hysterectomy from one of the other techniques. WHAT TO EXPECT AFTER A HYSTERECTOMY  You will be given pain medicine.  You will need to have someone with you for the first 3-5 days after you go home.  You will need to follow up with your surgeon in 2-4 weeks after surgery to evaluate your progress.  You may have early menopause symptoms such as hot flashes, night sweats, and insomnia.  If you had a hysterectomy for a problem that was not cancer or not a condition that could lead to cancer, then you no longer need Pap tests. However, even if you no longer need a Pap test, a regular exam is a good idea to make sure no other problems are starting. Document Released: 08/08/2000 Document Revised: 12/03/2012 Document Reviewed: 10/20/2012 [facility] Patient Information 2015 [place], [place]. This information is not intended  to replace advice given to you by your health care provider. Make sure you discuss any questions you have with your health  care provider.

## 2014-10-06 NOTE — Progress Notes (Signed)
Subjective:     Patient ID: Rachel Griffith, female   DOB: September 28, 1985, 29 y.o.   MRN: [id number]  HPI Rachel Griffith is a 29 year old white female in complaining of pelvic pain and bleeding for 2 weeks, she has mirena IUD and has documented endometriosis by laparoscopy.She has had the endometriosis for 6 years and has tried depo, nuvra ring and OCs and has IUD now, has had flare for last 2 months and much worse for last 2 weeks since bleeding started.Has missed work and lays on couch with heating pad, she has 2 children and does not want any more, she says she wants hysterectomy and for the pain to stop.Has been seen in [place] and had normal pap in April.She was offered Lupron but did not try due to side effects and then changed mind,but it has taken awhile to get in to see MD.She has been to ER for pain, last visit 30/1/16, had negative GC/CHL and negative pregnancy test.Sex hurts too. Has been taking advil and tylenol, but does not always help.  Review of Systems Patient denies any headaches, hearing loss, fatigue, blurred vision, shortness of breath, chest pain problems with bowel movements, urination. No joint pain or mood swings.See HPI for positives.  Reviewed past medical,surgical, social and family history. Reviewed medications and allergies.     Objective:   Physical Exam BP 90/50 mmHg  Pulse 64  Ht  (1.753 m)  Wt 143 lb (64.864 kg)  BMI 21.11 kg/m2  LMP 09/19/2014 Skin warm and dry.Pelvic: external genitalia is normal in appearance no lesions, vagina: period like blood, with out odor,urethra has no lesions or masses noted, cervix:smooth and bulbous,+IUD strings at os, uterus: normal size, shape and contour, tender, no masses felt, adnexa: no masses, has bilateral tenderness noted. Bladder is non tender and no masses felt. Discussed with Dr Emelda Fear and he agrees to see her.Will Rx megace to stop bleeding and get her in to see Dr Emelda Fear next week.    Assessment:     Pelvic  pain Endometriosis determined by lap.    Plan:     Rx megace 40 mg #45 3 x 5 days then 2 x 5 days then 1 daily with 1 refill Rx norco 5-325 mg #30 1 every 6 hours prn pain, no refills   Return in 1 week for pre op with Dr Emelda Fear Review handout on hysterectomy  Request records to include last pap.

## 2014-10-13 ENCOUNTER — Encounter: Payer: Self-pay | Admitting: Obstetrics and Gynecology

## 2014-10-13 ENCOUNTER — Ambulatory Visit (INDEPENDENT_AMBULATORY_CARE_PROVIDER_SITE_OTHER): Payer: Managed Care, Other (non HMO) | Admitting: Obstetrics and Gynecology

## 2014-10-13 VITALS — BP 98/58 | HR 64 | Ht 69.0 in | Wt 144.6 lb

## 2014-10-13 DIAGNOSIS — R102 Pelvic and perineal pain: Secondary | ICD-10-CM | POA: Diagnosis not present

## 2014-10-13 DIAGNOSIS — N809 Endometriosis, unspecified: Secondary | ICD-10-CM | POA: Diagnosis not present

## 2014-10-13 MED ORDER — AZITHROMYCIN 500 MG PO TABS: 1000.0000 mg | ORAL_TABLET | Freq: Once | ORAL | Status: DC

## 2014-10-13 MED ORDER — CEFTRIAXONE SODIUM 250 MG IJ SOLR: 250.0000 mg | Freq: Once | INTRAMUSCULAR | Status: DC

## 2014-10-13 MED ORDER — HYDROCODONE-ACETAMINOPHEN 5-325 MG PO TABS: 1.0000 | ORAL_TABLET | Freq: Four times a day (QID) | ORAL | Status: DC | PRN

## 2014-10-13 MED ORDER — METRONIDAZOLE 500 MG PO TABS: 500.0000 mg | ORAL_TABLET | Freq: Two times a day (BID) | ORAL | Status: DC

## 2014-10-13 NOTE — Progress Notes (Signed)
Patient ID: Rachel Griffith, female   DOB: [birth year], 29 y.o.   MRN: [id number]  This chart was scribed for [name], MD by Jarvis Morgan, ED Scribe. This patient was seen in room 2 and the patient's care was started at 10:22 AM.    [facility] Clinic Visit  Patient name: Rachel Griffith MRN [id number]  Date of birth: Nov 20, 1985  CC & HPI:  Rachel Griffith is a 29 y.o. female presenting today for constant, gradually worsening pelvic pain onset several months. She has had associated bleeding for 2 weeks as well as dyspareunia.Marland Kitchen Her LNMP was 09/19/14. She states her pain is exacerbated during her menstrual cycle. Pt had a laparoscopy done 2 years ago. She has had 2 children, vaginal deliveries. G2P2. Pt has a h/o endometriosis for 6 years and has tried Depo, Paediatric nurse, and OCs and has IUD now. Pt is here for a pre-op for a hysterectomy. She is open this will help to relieve her pain. Pt has been to the ER for the pain and her last visit on 09/27/14, she had a negative GC/CHL and negative pregnancy test.    ROS:  Positive for pelvic pain, vaginal bleeding and dyspareunia  Pertinent History Reviewed:   Reviewed: Significant for no desire for future pregnancy Medical         Past Medical History  Diagnosis Date  . Endometriosis   . Ovarian cyst   . Chronic pelvic pain in female   . Back injury   . PTSD (post-traumatic stress disorder)   . Pelvic pain in female 10/06/2014  . Endometriosis determined by laparoscopy 10/06/2014                              Surgical Hx:    Past Surgical History  Procedure Laterality Date  . Knee surgery    . Endometrosis  2014   Medications: Reviewed & Updated - see associated section                       Current outpatient prescriptions:  .  ALPRAZolam (XANAX) 0.25 MG tablet, Take 0.25 mg by mouth as needed. , Disp: , Rfl:  .  gabapentin (NEURONTIN) 300 MG capsule, Take 300 mg by mouth 3 (three) times daily., Disp: , Rfl:  .  ibuprofen  (ADVIL,MOTRIN) 200 MG tablet, Take 800 mg by mouth every 4 (four) hours as needed. , Disp: , Rfl:  .  Ibuprofen (MIDOL PO), Take by mouth. Takes 2 pills every 6 hours, Disp: , Rfl:  .  levonorgestrel (MIRENA) 20 MCG/24HR IUD, 1 each by Intrauterine route once., Disp: , Rfl:  .  megestrol (MEGACE) 40 MG tablet, Take 3 x 5 days then 2 x 5 days then 1 daily, Disp: 45 tablet, Rfl: 1 .  PARoxetine (PAXIL) 10 MG tablet, Take 10 mg by mouth daily. , Disp: , Rfl:  .  HYDROcodone-acetaminophen (NORCO/VICODIN) 5-325 MG per tablet, Take 1 tablet by mouth every 6 (six) hours as needed. (Patient not taking: Reported on 10/13/2014), Disp: 30 tablet, Rfl: 0   Social History: Reviewed -  reports that she has been smoking Cigarettes.  She has never used smokeless tobacco.  Objective Findings:  Vitals: Blood pressure 98/58, pulse 64, height  (1.753 m), weight 144 lb 9.6 oz (65.59 kg), last menstrual period 09/19/2014.  Physical Examination: General appearance - alert, well appearing, and in no  distress, uncomfortable with exam Pelvic - normal external genitalia, vulva, vagina, cervix, uterus and adnexa, UTERUS: uterus is normal size, shape, consistency and tender, retroverted Rectal - rectal exam not indicated   Assessment & Plan:   A: 2 1. Uterine retroversion,  2. First degree uterine descensus 3. Laparoscopic proven endometriosis  P:  1. Request pap/gc [facility]l from [facility] provider 2. Request laparoscopy op report 3  Move quickly per pt request to [facility],

## 2014-10-27 ENCOUNTER — Encounter: Payer: Self-pay | Admitting: Obstetrics and Gynecology

## 2014-10-27 ENCOUNTER — Ambulatory Visit (INDEPENDENT_AMBULATORY_CARE_PROVIDER_SITE_OTHER): Payer: Managed Care, Other (non HMO) | Admitting: Obstetrics and Gynecology

## 2014-10-27 VITALS — BP 110/70 | Ht 69.0 in | Wt 145.0 lb

## 2014-10-27 DIAGNOSIS — N809 Endometriosis, unspecified: Secondary | ICD-10-CM | POA: Diagnosis not present

## 2014-10-27 DIAGNOSIS — R102 Pelvic and perineal pain: Secondary | ICD-10-CM | POA: Diagnosis not present

## 2014-10-27 DIAGNOSIS — Z01818 Encounter for other preprocedural examination: Secondary | ICD-10-CM

## 2014-10-27 MED ORDER — HYDROCODONE-ACETAMINOPHEN 5-325 MG PO TABS: 1.0000 | ORAL_TABLET | Freq: Four times a day (QID) | ORAL | Status: DC | PRN

## 2014-10-27 NOTE — Progress Notes (Signed)
Patient ID: Rachel Griffith, female   DOB: [birth year], 29 y.o.   MRN: [id number]  This chart was scribed for Rachel Burrow, MD by Jarvis Morgan, ED Scribe. This patient was seen in room 1 and the patient's care was started at 10:10 AM.  Preoperative History and Physical  Rachel Griffith is a 29 y.o. G2P2000 here for surgical management of endometriosis.  No significant preoperative concerns. Her periods are abnormal and states she has been bleeding intermittently for 2 months with no relief. She also reports lower left sided abdominal pain onset 3 months. Pt had a transvaginal US performed 3 years ago which showed retroverted uterus, normal endometrium and multiple follicles to the left ovary. Pt is sexually active and using Mirena IUD. She has had the IUD placed 2 months ago. Her last pap smear was in October 2014. She has a h/o of post surgical incision cellulitis infection from a laparoscopy in 2014.  Proposed surgery: laparoscopy assisted vaginal hysterectomy  Past Medical History  Diagnosis Date  . Endometriosis   . Ovarian cyst   . Chronic pelvic pain in female   . Back injury   . PTSD (post-traumatic stress disorder)   . Pelvic pain in female 10/06/2014  . Endometriosis determined by laparoscopy 10/06/2014   Past Surgical History  Procedure Laterality Date  . Knee surgery    . Endometrosis  2014   OB History  Gravida Para Term Preterm AB SAB TAB Ectopic Multiple Living  2 2 2            # Outcome Date GA Lbr Len/2nd Weight Sex Delivery Anes PTL Lv  2 Term           1 Term             Patient denies any other pertinent gynecologic issues.   Current Outpatient Prescriptions on File Prior to Visit  Medication Sig Dispense Refill  . ALPRAZolam (XANAX) 0.25 MG tablet Take 0.25 mg by mouth as needed.     . gabapentin (NEURONTIN) 300 MG capsule Take 300 mg by mouth 3 (three) times daily.    Marland Kitchen ibuprofen (ADVIL,MOTRIN) 200 MG tablet Take 800 mg by mouth every 4 (four) hours  as needed.     Marland Kitchen levonorgestrel (MIRENA) 20 MCG/24HR IUD 1 each by Intrauterine route once.    . megestrol (MEGACE) 40 MG tablet Take 3 x 5 days then 2 x 5 days then 1 daily 45 tablet 1   No current facility-administered medications on file prior to visit.   Allergies  Allergen Reactions  . Augmentin [Amoxicillin-Pot Clavulanate] Other (See Comments)    Weak and lightheaded  . Sulfa Antibiotics Hives    Social History:   reports that she has been smoking Cigarettes.  She has a 1.25 pack-year smoking history. She has never used smokeless tobacco. She reports that she drinks alcohol. She reports that she does not use illicit drugs.  Family History  Problem Relation Age of Onset  . Cancer Father     prostate  . Other Sister     IBS  . Other Brother     Lyme Disease  . Dementia Maternal Grandmother   . Parkinson's disease Maternal Grandfather   . Thrombocytopenia Sister     Review of Systems:  + lower abdominal pain + vaginal bleeding No other complaints  PHYSICAL EXAM: Blood pressure 110/70, height 5\' 9"  (1.753 m), weight 145 lb (65.772 kg), last menstrual period 09/19/2014. General appearance - alert, well  appearing, and in no distress Chest - clear to auscultation, no wheezes, rales or rhonchi, symmetric air entry Heart - normal rate and regular rhythm Abdomen - soft, mild lower abdominal tenderness with fullness and guarding, no masses or organomegaly Pelvic - normal external genitalia. Diffuse sensitivity throughout  Cervix is mid position. Non purulent, IUD string not visible Uterus is retroverted. Tender to posterior contact.  Extremities - peripheral pulses normal, no pedal edema, no clubbing or cyanosis  Labs: No results found for this or any previous visit (from the past 336 hour(s)).  Imaging Studies: No results found.  Assessment: Patient Active Problem List   Diagnosis Date Noted  . Pelvic pain in female 10/06/2014  . Endometriosis determined by  laparoscopy 10/06/2014    Plan: Patient will undergo surgical management with laparoscopy assisted vaginal hysterectomy, salpingectomy, possible oophorectomy Will coordinate surgery and call pt.   10/27/2014 10:10 AM   I personally performed the services described in this documentation, which was SCRIBED in my presence. The recorded information has been reviewed and considered accurate. It has been edited as necessary during review. Rachel Burrow, MD   I personally performed the services described in this documentation, which was SCRIBED in my presence. The recorded information has been reviewed and considered accurate. It has been edited as necessary during review. Rachel Burrow, MD

## 2014-10-27 NOTE — Progress Notes (Signed)
Patient ID: Rachel Griffith, female   DOB: [birth year], 29 y.o.   MRN: [id number] Pt states that she is here today for pre op for a hysterectomy.

## 2014-10-27 NOTE — Patient Instructions (Signed)
Laparoscopically Assisted Vaginal Hysterectomy  A laparoscopically assisted vaginal hysterectomy (LAVH) is a surgical procedure to remove the uterus and cervix, and sometimes the ovaries and fallopian tubes. During an LAVH, some of the surgical removal is done through the vagina, and the rest is done through a few small surgical cuts (incisions) in the abdomen.  This procedure is usually considered in women when a vaginal hysterectomy is not an option. Your health care provider will discuss the risks and benefits of the different surgical techniques at your appointment. Generally, recovery time is faster and there are fewer complications after laparoscopic procedures than after open incisional procedures. LET YOUR HEALTH CARE PROVIDER KNOW ABOUT:   Any allergies you have.  All medicines you are taking, including vitamins, herbs, eye drops, creams, and over-the-counter medicines.  Previous problems you or members of your family have had with the use of anesthetics.  Any blood disorders you have.  Previous surgeries you have had.  Medical conditions you have. RISKS AND COMPLICATIONS Generally, this is a safe procedure. However, as with any procedure, complications can occur. Possible complications include:  Allergies to medicines.  Difficulty breathing.  Bleeding.  Infection.  Damage to other structures near your uterus and cervix. BEFORE THE PROCEDURE  Ask your health care provider about changing or stopping your regular medicines.  Take certain medicines, such as a colon-emptying preparation, as directed.  Do not eat or drink anything for at least 8 hours before your surgery.  Stop smoking if you smoke. Stopping will improve your health after surgery.  Arrange for a ride home after surgery and for help at home during recovery. PROCEDURE   An IV tube will be put into one of your veins in order to give you fluids and medicines.  You will receive medicines to relax you and  medicines that make you sleep (general anesthetic).  You may have a flexible tube (catheter) put into your bladder to drain urine.  You may have a tube put through your nose or mouth that goes into your stomach (nasogastric tube). The nasogastric tube removes digestive fluids and prevents you from feeling nauseated and from vomiting.  Tight-fitting (compression) stockings will be placed on your legs to promote circulation.  Three to four small incisions will be made in your abdomen. An incision also will be made in your vagina. Probes and tools will be inserted into the small incisions. The uterus and cervix are removed (and possibly your ovaries and fallopian tubes) through your vagina as well as through the small incisions that were made in the abdomen.  Your vagina is then sewn back to normal. AFTER THE PROCEDURE  You may have a liquid diet temporarily. You will most likely return to, and tolerate, your usual diet the day after surgery.  You will be passing urine through a catheter. It will be removed the day after surgery.  Your temperature, breathing rate, heart rate, blood pressure, and oxygen level will be monitored regularly.  You will still wear compression stockings on your legs until you are able to move around.  You will use a special device or do breathing exercises to keep your lungs clear.  You will be encouraged to walk as soon as possible. Document Released: 02/01/2011 Document Revised: 10/15/2012 Document Reviewed: 08/28/2012 [employer] Patient Information 2015 [employer]. This information is not intended to replace advice given to you by your health care provider. Make sure you discuss any questions you have with your health care provider.  

## 2014-10-30 LAB — GC/CHLAMYDIA PROBE AMP
CHLAMYDIA, DNA PROBE: NEGATIVE
Neisseria gonorrhoeae by PCR: NEGATIVE

## 2014-11-02 NOTE — Progress Notes (Signed)
LAP VAG HYST W/ SALPINGECTOMY POSSIBLE SALPINGO OPHORECTOMY 11-23-14 :30AM

## 2014-11-04 ENCOUNTER — Telehealth: Payer: Self-pay | Admitting: Obstetrics and Gynecology

## 2014-11-04 NOTE — Telephone Encounter (Signed)
Pt called stating that Dr.Ferguson told her to call when she needed a refill on her pain medication. Pt states that she is out of her medication. I advised the pt that she was given 60 tablets on the 31st and it should have lasted her longer than this, she then stated that Dr. Emelda Fear told her she could take two if she needed too. I advised her that the Rx was not written that way and that there would be no way for her to get a refill today because Dr. Emelda Fear is out of the office. I advised her that I would send him this message but that I could not make any promises, and that he may not refill it.

## 2014-11-08 ENCOUNTER — Encounter: Payer: Managed Care, Other (non HMO) | Admitting: Obstetrics and Gynecology

## 2014-11-08 NOTE — Telephone Encounter (Signed)
I will not refill opiates.

## 2014-11-18 ENCOUNTER — Other Ambulatory Visit: Payer: Self-pay | Admitting: Obstetrics and Gynecology

## 2014-11-18 ENCOUNTER — Encounter (HOSPITAL_COMMUNITY)
Admission: RE | Admit: 2014-11-18 | Discharge: 2014-11-18 | Disposition: A | Discharge status: Home / Self Care | Payer: Managed Care, Other (non HMO) | Source: Ambulatory Visit | Attending: Obstetrics and Gynecology | Admitting: Obstetrics and Gynecology

## 2014-11-18 ENCOUNTER — Encounter (HOSPITAL_COMMUNITY): Payer: Self-pay

## 2014-11-18 DIAGNOSIS — Z01818 Encounter for other preprocedural examination: Secondary | ICD-10-CM | POA: Insufficient documentation

## 2014-11-18 HISTORY — DX: Other specified postprocedural states: R11.2

## 2014-11-18 HISTORY — DX: Anxiety disorder, unspecified: F41.9

## 2014-11-18 HISTORY — DX: Other specified postprocedural states: Z98.890

## 2014-11-18 LAB — BASIC METABOLIC PANEL
ANION GAP: 5 (ref 5–15)
BUN: 13 mg/dL (ref 6–20)
CALCIUM: 8.7 mg/dL — AB (ref 8.9–10.3)
CO2: 27 mmol/L (ref 22–32)
Chloride: 104 mmol/L (ref 101–111)
Creatinine, Ser: 0.76 mg/dL (ref 0.44–1.00)
GLUCOSE: 87 mg/dL (ref 65–99)
POTASSIUM: 4.5 mmol/L (ref 3.5–5.1)
SODIUM: 136 mmol/L (ref 135–145)

## 2014-11-18 LAB — HCG, SERUM, QUALITATIVE: PREG SERUM: NEGATIVE

## 2014-11-18 LAB — URINALYSIS, ROUTINE W REFLEX MICROSCOPIC
BILIRUBIN URINE: NEGATIVE
GLUCOSE, UA: NEGATIVE mg/dL
HGB URINE DIPSTICK: NEGATIVE
Ketones, ur: NEGATIVE mg/dL
Leukocytes, UA: NEGATIVE
Nitrite: NEGATIVE
PROTEIN: NEGATIVE mg/dL
Specific Gravity, Urine: 1.02 (ref 1.005–1.030)
Urobilinogen, UA: 0.2 mg/dL (ref 0.0–1.0)
pH: 6 (ref 5.0–8.0)

## 2014-11-18 LAB — CBC
HEMATOCRIT: 35.2 % — AB (ref 36.0–46.0)
HEMOGLOBIN: 11.9 g/dL — AB (ref 12.0–15.0)
MCH: 32.8 pg (ref 26.0–34.0)
MCHC: 33.8 g/dL (ref 30.0–36.0)
MCV: 97 fL (ref 78.0–100.0)
Platelets: 216 10*3/uL (ref 150–400)
RBC: 3.63 MIL/uL — AB (ref 3.87–5.11)
RDW: 11.9 % (ref 11.5–15.5)
WBC: 6.9 10*3/uL (ref 4.0–10.5)

## 2014-11-18 LAB — TYPE AND SCREEN
ABO/RH(D): A NEG
ANTIBODY SCREEN: NEGATIVE

## 2014-11-18 NOTE — Patient Instructions (Signed)
IONIA SCHEY  11/18/2014     @   Your procedure is scheduled on  11/23/2014   Report to [facility] at  830  A.M.  Call this number if you have problems the morning of surgery:  [phone]   Remember:  Do not eat food or drink liquids after midnight.  Take these medicines the morning of surgery with A SIP OF WATER  Xanax, neurontin, hydrocodone.   Do not wear jewelry, make-up or nail polish.  Do not wear lotions, powders, or perfumes.  You may wear deodorant.  Do not shave 48 hours prior to surgery.  Men may shave face and neck.  Do not bring valuables to the hospital.  [facility] is not responsible for any belongings or valuables.  Contacts, dentures or bridgework may not be worn into surgery.  Leave your suitcase in the car.  After surgery it may be brought to your room.  For patients admitted to the hospital, discharge time will be determined by your treatment team.  Patients discharged the day of surgery will not be allowed to drive home.   Name and phone number of your driver:   family Special instructions:  Follow the prep and diet instructions given to you.  Please read over the following fact sheets that you were given. Pain Booklet, Coughing and Deep Breathing, Surgical Site Infection Prevention, Anesthesia Post-op Instructions and Care and Recovery After Surgery      Laparoscopically Assisted Vaginal Hysterectomy  A laparoscopically assisted vaginal hysterectomy (LAVH) is a surgical procedure to remove the uterus and cervix, and sometimes the ovaries and fallopian tubes. During an LAVH, some of the surgical removal is done through the vagina, and the rest is done through a few small surgical cuts (incisions) in the abdomen.  This procedure is usually considered in women when a vaginal hysterectomy is not an option. Your health care Mykelle Cockerell will discuss the risks and benefits of the different surgical techniques at your  appointment. Generally, recovery time is faster and there are fewer complications after laparoscopic procedures than after open incisional procedures. LET [facility] CARE Draedyn Weidinger KNOW ABOUT:   Any allergies you have.  All medicines you are taking, including vitamins, herbs, eye drops, creams, and over-the-counter medicines.  Previous problems you or members of your family have had with the use of anesthetics.  Any blood disorders you have.  Previous surgeries you have had.  Medical conditions you have. RISKS AND COMPLICATIONS Generally, this is a safe procedure. However, as with any procedure, complications can occur. Possible complications include:  Allergies to medicines.  Difficulty breathing.  Bleeding.  Infection.  Damage to other structures near your uterus and cervix. BEFORE THE PROCEDURE  Ask your health care Jaber Dunlow about changing or stopping your regular medicines.  Take certain medicines, such as a colon-emptying preparation, as directed.  Do not eat or drink anything for at least 8 hours before your surgery.  Stop smoking if you smoke. Stopping will improve your health after surgery.  Arrange for a ride home after surgery and for help at home during recovery. PROCEDURE   An IV tube will be put into one of your veins in order to give you fluids and medicines.  You will receive medicines to relax you and medicines that make you sleep (general anesthetic).  You may have a flexible tube (catheter) put into your bladder to drain urine.  You may have a tube put through  your nose or mouth that goes into your stomach (nasogastric tube). The nasogastric tube removes digestive fluids and prevents you from feeling nauseated and from vomiting.  Tight-fitting (compression) stockings will be placed on your legs to promote circulation.  Three to four small incisions will be made in your abdomen. An incision also will be made in your vagina. Probes and tools will be  inserted into the small incisions. The uterus and cervix are removed (and possibly your ovaries and fallopian tubes) through your vagina as well as through the small incisions that were made in the abdomen.  Your vagina is then sewn back to normal. AFTER THE PROCEDURE  You may have a liquid diet temporarily. You will most likely return to, and tolerate, your usual diet the day after surgery.  You will be passing urine through a catheter. It will be removed the day after surgery.  Your temperature, breathing rate, heart rate, blood pressure, and oxygen level will be monitored regularly.  You will still wear compression stockings on your legs until you are able to move around.  You will use a special device or do breathing exercises to keep your lungs clear.  You will be encouraged to walk as soon as possible. Document Released: 02/01/2011 Document Revised: 10/15/2012 Document Reviewed: 08/28/2012 [facility] Patient Information 2015 [place], [place]. This information is not intended to replace advice given to you by your health care Curtis Uriarte. Make sure you discuss any questions you have with your health care Jenika Chiem. Salpingectomy Salpingectomy, also called tubectomy, is the surgical removal of one of the fallopian tubes. The fallopian tubes are tubes that are connected to the uterus. These tubes transport the egg from the ovary to the uterus. A salpingectomy may be done for various reasons, including:   A tubal (ectopic) pregnancy. This is especially true if the tube ruptures.  An infected fallopian tube.  The need to remove the fallopian tube when removing an ovary with a cyst or tumor.  The need to remove the fallopian tube when removing the uterus.  Cancer of the fallopian tube or nearby organs. Removing one fallopian tube does not prevent you from becoming pregnant. It also does not cause problems with your menstrual periods.  LET [facility] CARE Delshawn Stech KNOW ABOUT:  Any  allergies you have.  All medicines you are taking, including vitamins, herbs, eye drops, creams, and over-the-counter medicines.  Previous problems you or members of your family have had with the use of anesthetics.  Any blood disorders you have.  Previous surgeries you have had.  Medical conditions you have. RISKS AND COMPLICATIONS  Generally, this is a safe procedure. However, as with any procedure, complications can occur. Possible complications include:  Injury to surrounding organs.  Bleeding.  Infection.  Problems related to anesthesia. BEFORE THE PROCEDURE  Ask your health care Keldan Eplin about changing or stopping your regular medicines. You may need to stop taking certain medicines, such as aspirin or blood thinners, at least 1 week before the surgery.  Do not eat or drink anything for at least 8 hours before the surgery.  If you smoke, do not smoke for at least 2 weeks before the surgery.  Make plans to have someone drive you home after the procedure or after your hospital stay. Also arrange for someone to help you with activities during recovery. PROCEDURE   You will be given medicine to help you relax before the procedure (sedative). You will then be given medicine to make you sleep through the  procedure (general anesthetic). These medicines will be given through an IV access tube that is put into one of your veins.  Once you are asleep, your lower abdomen will be shaved and cleaned. A thin, flexible tube (catheter) will be placed in your bladder.  The surgeon may use a laparoscopic, robotic, or open technique for this surgery:  In the laparoscopic technique, the surgery is done through two small cuts (incisions) in the abdomen. A thin, lighted tube with a tiny camera on the end (laparoscope) is inserted into one of the incisions. The tools needed for the procedure are put through the other incision.  A robotic technique may be chosen to perform complex surgery in a  small space. In the robotic technique, small incisions will be made. A camera and surgical instruments are passed through the incisions. Surgical instruments will be controlled with the help of a robotic arm.  In the open technique, the surgery is done through one large incision in the abdomen.  Using any of these techniques, the surgeon removes the fallopian tube from where it attaches to the uterus. The blood vessels will be clamped and tied.  The surgeon then uses staples or stitches to close the incision or incisions. AFTER THE PROCEDURE   You will be taken to a recovery area where your progress will be monitored for 1-3 hours.  If the laparoscopic technique was used, you may be allowed to go home after several hours. You may have some shoulder pain after the laparoscopic procedure. This is normal and usually goes away in a day or two.  If the open technique was used, you will be admitted to the hospital for a couple of days.  You will be given pain medicine if needed.  The IV access tube and catheter will be removed before you are discharged. Document Released: 07/01/2008 Document Revised: 12/03/2012 Document Reviewed: 08/06/2012 [facility] Patient Information 2015 [place], [place]. This information is not intended to replace advice given to you by your health care Charlie Char. Make sure you discuss any questions you have with your health care Zakiyah Diop. PATIENT INSTRUCTIONS POST-ANESTHESIA  IMMEDIATELY FOLLOWING SURGERY:  Do not drive or operate machinery for the first twenty four hours after surgery.  Do not make any important decisions for twenty four hours after surgery or while taking narcotic pain medications or sedatives.  If you develop intractable nausea and vomiting or a severe headache please notify your doctor immediately.  FOLLOW-UP:  Please make an appointment with your surgeon as instructed. You do not need to follow up with anesthesia unless specifically instructed to do  so.  WOUND CARE INSTRUCTIONS (if applicable):  Keep a dry clean dressing on the anesthesia/puncture wound site if there is drainage.  Once the wound has quit draining you may leave it open to air.  Generally you should leave the bandage intact for twenty four hours unless there is drainage.  If the epidural site drains for more than 36-48 hours please call the anesthesia department.  QUESTIONS?:  Please feel free to call your physician or the hospital operator if you have any questions, and they will be happy to assist you.

## 2014-11-18 NOTE — Pre-Procedure Instructions (Signed)
Patient states when she had her 1st arthroscopy in 2004 at [facility] in [place] that after she got home she became very "weak and could not hold my head up. My Mom had to drag me to the car" She states they readmitted her and gave her IV fluids and changed her pain meds. She was told this reaction was due to a combination of the pain med, Percocet, she was on and the anesthesia. She states they did not do any type of testing after this incident. She did fine with her 2nd knee scope three months later at [facility] except for severe N&V. She had a third surgery at [facility] and did very well, "They gave me a lot of stuff for nausea". Dr Jayme Cloud aware of this history. Patient given information to sign up for my chart at home.

## 2014-11-19 LAB — RPR: RPR Ser Ql: NONREACTIVE

## 2014-11-23 ENCOUNTER — Encounter (HOSPITAL_COMMUNITY)
Admission: RE | Disposition: A | Discharge status: Home / Self Care | Payer: Self-pay | Source: Ambulatory Visit | Attending: Obstetrics and Gynecology

## 2014-11-23 ENCOUNTER — Ambulatory Visit (HOSPITAL_COMMUNITY): Payer: Managed Care, Other (non HMO) | Admitting: Anesthesiology

## 2014-11-23 ENCOUNTER — Encounter (HOSPITAL_COMMUNITY): Payer: Self-pay | Admitting: *Deleted

## 2014-11-23 ENCOUNTER — Observation Stay (HOSPITAL_COMMUNITY)
Admission: RE | Admit: 2014-11-23 | Discharge: 2014-11-24 | Disposition: A | Discharge status: Home / Self Care | Payer: Managed Care, Other (non HMO) | Source: Ambulatory Visit | Attending: Obstetrics and Gynecology | Admitting: Obstetrics and Gynecology

## 2014-11-23 DIAGNOSIS — Z882 Allergy status to sulfonamides status: Secondary | ICD-10-CM | POA: Diagnosis not present

## 2014-11-23 DIAGNOSIS — Z881 Allergy status to other antibiotic agents status: Secondary | ICD-10-CM | POA: Diagnosis not present

## 2014-11-23 DIAGNOSIS — N809 Endometriosis, unspecified: Secondary | ICD-10-CM | POA: Diagnosis not present

## 2014-11-23 DIAGNOSIS — R600 Localized edema: Secondary | ICD-10-CM | POA: Diagnosis not present

## 2014-11-23 DIAGNOSIS — F4310 Post-traumatic stress disorder, unspecified: Secondary | ICD-10-CM | POA: Diagnosis not present

## 2014-11-23 DIAGNOSIS — N854 Malposition of uterus: Secondary | ICD-10-CM | POA: Diagnosis not present

## 2014-11-23 DIAGNOSIS — Z90710 Acquired absence of both cervix and uterus: Secondary | ICD-10-CM | POA: Diagnosis present

## 2014-11-23 DIAGNOSIS — N7011 Chronic salpingitis: Secondary | ICD-10-CM | POA: Diagnosis not present

## 2014-11-23 DIAGNOSIS — N99820 Postprocedural hemorrhage and hematoma of a genitourinary system organ or structure following a genitourinary system procedure: Secondary | ICD-10-CM | POA: Diagnosis not present

## 2014-11-23 DIAGNOSIS — F419 Anxiety disorder, unspecified: Secondary | ICD-10-CM | POA: Diagnosis not present

## 2014-11-23 DIAGNOSIS — F17210 Nicotine dependence, cigarettes, uncomplicated: Secondary | ICD-10-CM | POA: Diagnosis not present

## 2014-11-23 DIAGNOSIS — R102 Pelvic and perineal pain: Secondary | ICD-10-CM | POA: Diagnosis not present

## 2014-11-23 DIAGNOSIS — N7091 Salpingitis, unspecified: Secondary | ICD-10-CM | POA: Diagnosis not present

## 2014-11-23 DIAGNOSIS — Y838 Other surgical procedures as the cause of abnormal reaction of the patient, or of later complication, without mention of misadventure at the time of the procedure: Secondary | ICD-10-CM | POA: Diagnosis not present

## 2014-11-23 DIAGNOSIS — G8929 Other chronic pain: Secondary | ICD-10-CM | POA: Diagnosis not present

## 2014-11-23 SURGERY — HYSTERECTOMY, VAGINAL, LAPAROSCOPY-ASSISTED, WITH SALPINGECTOMY
Anesthesia: General | Laterality: Bilateral

## 2014-11-23 MED ORDER — KETOROLAC TROMETHAMINE 30 MG/ML IJ SOLN: 30.0000 mg | Freq: Four times a day (QID) | INTRAMUSCULAR | Status: DC

## 2014-11-23 MED ORDER — GABAPENTIN 300 MG PO CAPS
300.0000 mg | ORAL_CAPSULE | Freq: Three times a day (TID) | ORAL | Status: DC
  Administered 2014-11-23 – 2014-11-24 (×3): 300 mg via ORAL
  Filled 2014-11-23 (×3): qty 1

## 2014-11-23 MED ORDER — ONDANSETRON HCL 4 MG/2ML IJ SOLN
INTRAMUSCULAR | Status: AC
  Filled 2014-11-23: qty 2

## 2014-11-23 MED ORDER — ONDANSETRON HCL 4 MG PO TABS: 4.0000 mg | ORAL_TABLET | Freq: Four times a day (QID) | ORAL | Status: DC | PRN

## 2014-11-23 MED ORDER — ONDANSETRON HCL 4 MG/2ML IJ SOLN
4.0000 mg | Freq: Once | INTRAMUSCULAR | Status: DC | PRN
Start: 2014-11-23 — End: 2014-11-23

## 2014-11-23 MED ORDER — LORAZEPAM 2 MG/ML IJ SOLN
1.0000 mg | Freq: Once | INTRAMUSCULAR | Status: AC
  Filled 2014-11-23: qty 1

## 2014-11-23 MED ORDER — FENTANYL CITRATE (PF) 250 MCG/5ML IJ SOLN
INTRAMUSCULAR | Status: AC
  Filled 2014-11-23: qty 25

## 2014-11-23 MED ORDER — SODIUM CHLORIDE 0.9 % IJ SOLN
INTRAMUSCULAR | Status: AC
  Filled 2014-11-23: qty 3

## 2014-11-23 MED ORDER — NALOXONE HCL 0.4 MG/ML IJ SOLN: 0.4000 mg | INTRAMUSCULAR | Status: DC | PRN

## 2014-11-23 MED ORDER — HYDROMORPHONE HCL 1 MG/ML IJ SOLN
INTRAMUSCULAR | Status: AC
  Filled 2014-11-23: qty 1

## 2014-11-23 MED ORDER — MIDAZOLAM HCL 2 MG/2ML IJ SOLN
0.5000 mg | Freq: Once | INTRAMUSCULAR | Status: AC
  Administered 2014-11-23: 2 mg via INTRAVENOUS

## 2014-11-23 MED ORDER — GLYCOPYRROLATE 0.2 MG/ML IJ SOLN
0.2000 mg | Freq: Once | INTRAMUSCULAR | Status: AC
  Administered 2014-11-23: 0.2 mg via INTRAVENOUS

## 2014-11-23 MED ORDER — LACTATED RINGERS IV SOLN
INTRAVENOUS | Status: DC
  Administered 2014-11-23 (×3): via INTRAVENOUS

## 2014-11-23 MED ORDER — LIDOCAINE HCL (PF) 1 % IJ SOLN
INTRAMUSCULAR | Status: AC
  Filled 2014-11-23: qty 5

## 2014-11-23 MED ORDER — PANTOPRAZOLE SODIUM 40 MG PO TBEC
40.0000 mg | DELAYED_RELEASE_TABLET | Freq: Every day | ORAL | Status: DC
  Administered 2014-11-24: 40 mg via ORAL
  Filled 2014-11-23: qty 1

## 2014-11-23 MED ORDER — GLYCOPYRROLATE 0.2 MG/ML IJ SOLN
INTRAMUSCULAR | Status: DC | PRN
  Administered 2014-11-23: 0.4 mg via INTRAVENOUS

## 2014-11-23 MED ORDER — ONDANSETRON HCL 4 MG/2ML IJ SOLN: 4.0000 mg | Freq: Four times a day (QID) | INTRAMUSCULAR | Status: DC | PRN

## 2014-11-23 MED ORDER — INFLUENZA VAC SPLIT QUAD 0.5 ML IM SUSY
0.5000 mL | PREFILLED_SYRINGE | INTRAMUSCULAR | Status: AC
  Administered 2014-11-24: 0.5 mL via INTRAMUSCULAR

## 2014-11-23 MED ORDER — SODIUM CHLORIDE 0.9 % IV SOLN
INTRAVENOUS | Status: DC
  Administered 2014-11-23 – 2014-11-24 (×3): via INTRAVENOUS

## 2014-11-23 MED ORDER — DEXAMETHASONE SODIUM PHOSPHATE 4 MG/ML IJ SOLN
4.0000 mg | Freq: Once | INTRAMUSCULAR | Status: AC
  Administered 2014-11-23: 4 mg via INTRAVENOUS

## 2014-11-23 MED ORDER — SCOPOLAMINE 1 MG/3DAYS TD PT72
MEDICATED_PATCH | TRANSDERMAL | Status: AC
  Filled 2014-11-23: qty 1

## 2014-11-23 MED ORDER — DEXAMETHASONE SODIUM PHOSPHATE 4 MG/ML IJ SOLN
INTRAMUSCULAR | Status: AC
  Filled 2014-11-23: qty 1

## 2014-11-23 MED ORDER — DIPHENHYDRAMINE HCL 12.5 MG/5ML PO ELIX: 12.5000 mg | ORAL_SOLUTION | Freq: Four times a day (QID) | ORAL | Status: DC | PRN

## 2014-11-23 MED ORDER — ROCURONIUM BROMIDE 100 MG/10ML IV SOLN
INTRAVENOUS | Status: DC | PRN
  Administered 2014-11-23 (×2): 5 mg via INTRAVENOUS
  Administered 2014-11-23: 40 mg via INTRAVENOUS

## 2014-11-23 MED ORDER — ONDANSETRON HCL 4 MG/2ML IJ SOLN
4.0000 mg | Freq: Once | INTRAMUSCULAR | Status: AC
  Administered 2014-11-23: 4 mg via INTRAVENOUS

## 2014-11-23 MED ORDER — KETOROLAC TROMETHAMINE 30 MG/ML IJ SOLN
30.0000 mg | Freq: Four times a day (QID) | INTRAMUSCULAR | Status: DC
  Administered 2014-11-23 – 2014-11-24 (×3): 30 mg via INTRAVENOUS
  Filled 2014-11-23 (×3): qty 1

## 2014-11-23 MED ORDER — DIPHENHYDRAMINE HCL 50 MG/ML IJ SOLN: 12.5000 mg | Freq: Four times a day (QID) | INTRAMUSCULAR | Status: DC | PRN

## 2014-11-23 MED ORDER — GLYCOPYRROLATE 0.2 MG/ML IJ SOLN
INTRAMUSCULAR | Status: AC
  Filled 2014-11-23: qty 2

## 2014-11-23 MED ORDER — BUPIVACAINE HCL (PF) 0.5 % IJ SOLN
INTRAMUSCULAR | Status: DC | PRN
  Administered 2014-11-23: 7 mL
  Administered 2014-11-23: 6 mL

## 2014-11-23 MED ORDER — IBUPROFEN 600 MG PO TABS
600.0000 mg | ORAL_TABLET | Freq: Four times a day (QID) | ORAL | Status: DC | PRN
  Administered 2014-11-24: 600 mg via ORAL
  Filled 2014-11-23: qty 1

## 2014-11-23 MED ORDER — FENTANYL CITRATE (PF) 100 MCG/2ML IJ SOLN
INTRAMUSCULAR | Status: DC | PRN
  Administered 2014-11-23: 100 ug via INTRAVENOUS
  Administered 2014-11-23 (×4): 50 ug via INTRAVENOUS
  Administered 2014-11-23: 100 ug via INTRAVENOUS
  Administered 2014-11-23 (×2): 50 ug via INTRAVENOUS

## 2014-11-23 MED ORDER — SCOPOLAMINE 1 MG/3DAYS TD PT72
1.0000 | MEDICATED_PATCH | Freq: Once | TRANSDERMAL | Status: DC
  Administered 2014-11-23: 1.5 mg via TRANSDERMAL

## 2014-11-23 MED ORDER — PROMETHAZINE HCL 25 MG/ML IJ SOLN
6.2500 mg | INTRAMUSCULAR | Status: DC | PRN
  Filled 2014-11-23: qty 1

## 2014-11-23 MED ORDER — SODIUM CHLORIDE 0.9 % IJ SOLN: 9.0000 mL | INTRAMUSCULAR | Status: DC | PRN

## 2014-11-23 MED ORDER — CEFAZOLIN SODIUM-DEXTROSE 2-3 GM-% IV SOLR
2.0000 g | INTRAVENOUS | Status: AC
  Administered 2014-11-23: 2 g via INTRAVENOUS
  Filled 2014-11-23: qty 50

## 2014-11-23 MED ORDER — LIDOCAINE HCL 1 % IJ SOLN
INTRAMUSCULAR | Status: DC | PRN
  Administered 2014-11-23: 25 mg via INTRADERMAL

## 2014-11-23 MED ORDER — BUPIVACAINE HCL (PF) 0.5 % IJ SOLN
INTRAMUSCULAR | Status: AC
  Filled 2014-11-23: qty 30

## 2014-11-23 MED ORDER — MIDAZOLAM HCL 2 MG/2ML IJ SOLN
1.0000 mg | INTRAMUSCULAR | Status: DC | PRN
  Administered 2014-11-23 (×3): 2 mg via INTRAVENOUS
  Filled 2014-11-23 (×3): qty 2

## 2014-11-23 MED ORDER — NEOSTIGMINE METHYLSULFATE 10 MG/10ML IV SOLN
INTRAVENOUS | Status: AC
  Filled 2014-11-23: qty 1

## 2014-11-23 MED ORDER — PROPOFOL 10 MG/ML IV BOLUS
INTRAVENOUS | Status: AC
  Filled 2014-11-23: qty 20

## 2014-11-23 MED ORDER — GLYCOPYRROLATE 0.2 MG/ML IJ SOLN
INTRAMUSCULAR | Status: AC
Start: 2014-11-23 — End: 2014-11-23
  Filled 2014-11-23: qty 1

## 2014-11-23 MED ORDER — 0.9 % SODIUM CHLORIDE (POUR BTL) OPTIME
TOPICAL | Status: DC | PRN
  Administered 2014-11-23 (×2): 1000 mL

## 2014-11-23 MED ORDER — MIDAZOLAM HCL 2 MG/2ML IJ SOLN
INTRAMUSCULAR | Status: AC
  Filled 2014-11-23: qty 4

## 2014-11-23 MED ORDER — PROPOFOL 10 MG/ML IV BOLUS
INTRAVENOUS | Status: DC | PRN
  Administered 2014-11-23: 150 mg via INTRAVENOUS

## 2014-11-23 MED ORDER — KETOROLAC TROMETHAMINE 30 MG/ML IJ SOLN
30.0000 mg | Freq: Once | INTRAMUSCULAR | Status: AC
  Administered 2014-11-23: 30 mg via INTRAVENOUS

## 2014-11-23 MED ORDER — ROCURONIUM BROMIDE 50 MG/5ML IV SOLN
INTRAVENOUS | Status: AC
  Filled 2014-11-23: qty 1

## 2014-11-23 MED ORDER — HYDROMORPHONE 0.3 MG/ML IV SOLN
INTRAVENOUS | Status: DC
  Administered 2014-11-23: 22:00:00 via INTRAVENOUS
  Administered 2014-11-23: 5.4 mg via INTRAVENOUS
  Administered 2014-11-23: 15:00:00 via INTRAVENOUS
  Administered 2014-11-24 (×2): 5 mg via INTRAVENOUS
  Filled 2014-11-23: qty 25

## 2014-11-23 MED ORDER — OXYCODONE-ACETAMINOPHEN 5-325 MG PO TABS
1.0000 | ORAL_TABLET | ORAL | Status: DC | PRN
  Administered 2014-11-23: 1 via ORAL
  Administered 2014-11-23 – 2014-11-24 (×3): 2 via ORAL
  Administered 2014-11-24: 1 via ORAL
  Filled 2014-11-23: qty 2
  Filled 2014-11-23: qty 1
  Filled 2014-11-23: qty 2
  Filled 2014-11-23: qty 1
  Filled 2014-11-23: qty 2
  Filled 2014-11-23: qty 1

## 2014-11-23 MED ORDER — MIDAZOLAM HCL 5 MG/5ML IJ SOLN
INTRAMUSCULAR | Status: DC | PRN
  Administered 2014-11-23: 2 mg via INTRAVENOUS

## 2014-11-23 MED ORDER — HYDROMORPHONE 0.3 MG/ML IV SOLN
INTRAVENOUS | Status: AC
  Filled 2014-11-23: qty 25

## 2014-11-23 MED ORDER — FENTANYL CITRATE (PF) 100 MCG/2ML IJ SOLN: 25.0000 ug | INTRAMUSCULAR | Status: DC | PRN

## 2014-11-23 MED ORDER — MIDAZOLAM HCL 2 MG/2ML IJ SOLN
INTRAMUSCULAR | Status: AC
  Filled 2014-11-23: qty 2

## 2014-11-23 MED ORDER — NEOSTIGMINE METHYLSULFATE 10 MG/10ML IV SOLN
INTRAVENOUS | Status: DC | PRN
  Administered 2014-11-23: 2 mg via INTRAVENOUS

## 2014-11-23 MED ORDER — HYDROMORPHONE HCL 1 MG/ML IJ SOLN
0.2500 mg | INTRAMUSCULAR | Status: AC | PRN
  Administered 2014-11-23 (×8): 0.5 mg via INTRAVENOUS
  Filled 2014-11-23 (×2): qty 1

## 2014-11-23 MED ORDER — KETOROLAC TROMETHAMINE 30 MG/ML IJ SOLN
INTRAMUSCULAR | Status: AC
  Filled 2014-11-23: qty 1

## 2014-11-23 SURGICAL SUPPLY — 61 items
BAG HAMPER (MISCELLANEOUS) ×3 IMPLANT
BLADE SURG SZ11 CARB STEEL (BLADE) ×3 IMPLANT
CLOSURE WOUND 1/2 X4 (GAUZE/BANDAGES/DRESSINGS) ×1
CLOSURE WOUND 1/4 X3 (GAUZE/BANDAGES/DRESSINGS) ×1
CLOTH BEACON ORANGE TIMEOUT ST (SAFETY) ×3 IMPLANT
COVER LIGHT HANDLE STERIS (MISCELLANEOUS) ×6 IMPLANT
DECANTER SPIKE VIAL GLASS SM (MISCELLANEOUS) ×3 IMPLANT
DRAPE STERI URO 9X17 APER PCH (DRAPES) ×3 IMPLANT
DRESSING COVERLET 3X1 FLEXIBLE (GAUZE/BANDAGES/DRESSINGS) ×9 IMPLANT
DURAPREP 26ML APPLICATOR (WOUND CARE) ×3 IMPLANT
ELECT REM PT RETURN 9FT ADLT (ELECTROSURGICAL) ×3
ELECTRODE REM PT RTRN 9FT ADLT (ELECTROSURGICAL) ×1 IMPLANT
FILTER SMOKE EVAC LAPAROSHD (FILTER) ×3 IMPLANT
GAUZE PACKING 2X5 YD STERILE (GAUZE/BANDAGES/DRESSINGS) ×3 IMPLANT
GAUZE SPONGE 4X4 16PLY XRAY LF (GAUZE/BANDAGES/DRESSINGS) ×3 IMPLANT
GLOVE BIOGEL PI IND STRL 7.0 (GLOVE) ×8 IMPLANT
GLOVE BIOGEL PI IND STRL 9 (GLOVE) ×1 IMPLANT
GLOVE BIOGEL PI INDICATOR 7.0 (GLOVE) ×16
GLOVE BIOGEL PI INDICATOR 9 (GLOVE) ×2
GLOVE ECLIPSE 6.5 STRL STRAW (GLOVE) ×18 IMPLANT
GLOVE ECLIPSE 9.0 STRL (GLOVE) ×6 IMPLANT
GLOVE EXAM NITRILE MD LF STRL (GLOVE) ×9 IMPLANT
GLOVE SS BIOGEL STRL SZ 6.5 (GLOVE) ×1 IMPLANT
GLOVE SUPERSENSE BIOGEL SZ 6.5 (GLOVE) ×2
GOWN SPEC L3 XXLG W/TWL (GOWN DISPOSABLE) ×6 IMPLANT
GOWN STRL REUS W/TWL LRG LVL3 (GOWN DISPOSABLE) ×27 IMPLANT
INST SET LAPROSCOPIC GYN AP (KITS) ×3 IMPLANT
KIT BLADEGUARD II DBL (SET/KITS/TRAYS/PACK) ×3 IMPLANT
KIT ROOM TURNOVER AP CYSTO (KITS) ×3 IMPLANT
LIGASURE 5MM LAPAROSCOPIC (INSTRUMENTS) IMPLANT
MANIFOLD NEPTUNE II (INSTRUMENTS) ×3 IMPLANT
NEEDLE HYPO 18GX1.5 BLUNT FILL (NEEDLE) IMPLANT
NEEDLE HYPO 25X1 1.5 SAFETY (NEEDLE) ×3 IMPLANT
NEEDLE INSUFFLATION 14GA 120MM (NEEDLE) ×3 IMPLANT
PACK PERI GYN (CUSTOM PROCEDURE TRAY) ×3 IMPLANT
PAD ARMBOARD 7.5X6 YLW CONV (MISCELLANEOUS) ×3 IMPLANT
POUCH SPECIMEN RETRIEVAL 10MM (ENDOMECHANICALS) ×3 IMPLANT
SCALPEL HARMONIC ACE (MISCELLANEOUS) IMPLANT
SET BASIN LINEN APH (SET/KITS/TRAYS/PACK) ×3 IMPLANT
SET TUBE IRRIG SUCTION NO TIP (IRRIGATION / IRRIGATOR) IMPLANT
SHEARS HARMONIC ACE PLUS 36CM (ENDOMECHANICALS) ×3 IMPLANT
SLEEVE ENDOPATH XCEL 5M (ENDOMECHANICALS) ×3 IMPLANT
SOLUTION ANTI FOG 6CC (MISCELLANEOUS) ×3 IMPLANT
STRIP CLOSURE SKIN 1/2X4 (GAUZE/BANDAGES/DRESSINGS) ×2 IMPLANT
STRIP CLOSURE SKIN 1/4X3 (GAUZE/BANDAGES/DRESSINGS) ×2 IMPLANT
SUT CHROMIC 0 CT 1 (SUTURE) ×27 IMPLANT
SUT CHROMIC 2 0 CT 1 (SUTURE) ×3 IMPLANT
SUT PROLENE 0 CT 1 30 (SUTURE) ×3 IMPLANT
SUT PROLENE 2 0 SH 30 (SUTURE) ×3 IMPLANT
SUT VIC AB 4-0 PS2 27 (SUTURE) ×3 IMPLANT
SUT VICRYL 0 UR6 27IN ABS (SUTURE) ×3 IMPLANT
SYR BULB IRRIGATION 50ML (SYRINGE) ×3 IMPLANT
SYR CONTROL 10ML LL (SYRINGE) ×3 IMPLANT
SYRINGE 10CC LL (SYRINGE) ×3 IMPLANT
TRAY FOLEY CATH SILVER 16FR (SET/KITS/TRAYS/PACK) ×3 IMPLANT
TROCAR ENDO BLADELESS 11MM (ENDOMECHANICALS) ×3 IMPLANT
TROCAR XCEL NON-BLD 5MMX100MML (ENDOMECHANICALS) ×3 IMPLANT
TROCAR Z-THAD FIOS HNDL 12X100 (TROCAR) IMPLANT
TUBING DYE INJECTION 900-617 (MISCELLANEOUS) IMPLANT
TUBING INSUFFLATION (TUBING) ×3 IMPLANT
WARMER LAPAROSCOPE (MISCELLANEOUS) ×3 IMPLANT

## 2014-11-23 NOTE — Transfer of Care (Signed)
Immediate Anesthesia Transfer of Care Note  Patient: Rachel Griffith  Procedure(s) Performed: Procedure(s): LAPAROSCOPIC ASSISTED VAGINAL HYSTERECTOMY WITH BILATERAL SALPINGECTOMY (Bilateral)  Patient Location: PACU  Anesthesia Type:General  Level of Consciousness: awake and patient cooperative  Airway & Oxygen Therapy: Patient Spontanous Breathing and Patient connected to face mask oxygen  Post-op Assessment: Report given to RN, Post -op Vital signs reviewed and stable and Patient moving all extremities  Post vital signs: Reviewed and stable  Last Vitals:  Filed Vitals:   11/23/14 1035  BP: 95/66  Resp: 19    Complications: No apparent anesthesia complications

## 2014-11-23 NOTE — Anesthesia Procedure Notes (Signed)
Procedure Name: Intubation Date/Time: 11/23/2014 10:50 AM Performed by: Despina Hidden Pre-anesthesia Checklist: Suction available, Patient being monitored, Emergency Drugs available and Patient identified Patient Re-evaluated:Patient Re-evaluated prior to inductionOxygen Delivery Method: Circle system utilized Preoxygenation: Pre-oxygenation with 100% oxygen Ventilation: Mask ventilation without difficulty Laryngoscope Size: Mac and 3 Grade View: Grade I Tube type: Oral Tube size: 7.0 mm Number of attempts: 1 Airway Equipment and Method: Stylet and Oral airway Placement Confirmation: ETT inserted through vocal cords under direct vision,  breath sounds checked- equal and bilateral and positive ETCO2 Secured at: 22 cm Tube secured with: Tape Dental Injury: Teeth and Oropharynx as per pre-operative assessment

## 2014-11-23 NOTE — Op Note (Signed)
11/23/2014  1:46 PM  PATIENT:  Rachel Griffith  29 y.o. female  PRE-OPERATIVE DIAGNOSIS:  pelvic pain, endometrosis, uterine retroversion  POST-OPERATIVE DIAGNOSIS:  pelvic pain, endometrosis, uterine retroversion  PROCEDURE:  Procedure(s): LAPAROSCOPIC ASSISTED VAGINAL HYSTERECTOMY WITH BILATERAL SALPINGECTOMY (Bilateral)  SURGEON:  Surgeon(s) and Role:    * [name], MD - Primary  PHYSICIAN ASSISTANT:   ASSISTANTS: [place] RN,-FA   ANESTHESIA:   local and general  EBL:  Total I/O In: 2700 [I.V.:2700] Out: 275 [Blood:275]  BLOOD ADMINISTERED:none  DRAINS: Urinary Catheter (Foley) and Vaginal pack Betadine soaked   LOCAL MEDICATIONS USED:  MARCAINE    and Amount: 20 ml  SPECIMEN:  Source of Specimen:  Uterus and cervix, bilateral fallopian tubes  DISPOSITION OF SPECIMEN:  PATHOLOGY  COUNTS:  YES  TOURNIQUET:  * No tourniquets in log *  DICTATION: .Dragon Dictation  PLAN OF CARE: Admit for overnight observation  PATIENT DISPOSITION:  PACU - hemodynamically stable.   Delay start of Pharmacological VTE agent (>24hrs) due to surgical blood loss or risk of bleeding: not applicable Indications 29 year old with laparoscopically confirmed grade 1 endometriosis, treated year ago with residual pelvic discomfort, worsening bilateral pelvic relaxation uterine retroversion. Patient requests vaginal hysterectomy. Ovarian preservation is planned if at all possible Findings include generally normal appearing ovaries and filmy adhesions over the surface of the ovary on the right, peeled off. There was some possible grade 1 endometriosis in the cul-de-sac, but the remainder of the pelvic floor appeared grossly normal Details of procedure patient was taken operating room prepped and draped for combined abdominal and vaginal procedure. Infraumbilical vertical 1 cm skin incision was made as well as a transverse suprapubic and right lower quadrant incision. Attention was directed  to the umbilicus. Palpation showed the sacral promontory could be easily identifiable and particular care was taken to oriented Veress needle toward the pelvis and to elevate the abdominal wall and carefully oriented needle to the pelvis and only go deep enough to achieve pneumoperitoneum under 5 mmHg pressure after water droplet technique confirmed intraperitoneal location. Laparoscopic trocar was inserted under direct visualization and pelvis appeared grossly normal. It wasn't thin filmy adhesions on the surface of the ovary that did not show any evidence of endometriosis implants on the ovaries. The risks normal appearing fallopian tubes. The uterus Was floppy retroverted  and challenging to mobilize due to its floppy nature, despite a Hulka tenaculum. Suprapubic and right lower quadrant trochars were used in place under direct visualization and attention directed to the pelvis. Laparoscopic harmonic scalpel removal of the fallopian tubes was performed and they were extracted through the sixth the right round ligament and right broad ligament and utero-ovarian ligament were transected and coagulated with the Harmonic scalpel, the harmonic Ace 7 with adequate hemostasis. Small bit of bleeding from the side of the uterus responded to coagulation on the harmonic Ace coagulation setting the left side was able to be mobilized quite nicely. Bladder flap was developed anteriorly attention was then redirected. The abdomen was deflated laparoscopic instruments removed laparoscopic sleeves pulled up to the Veress to be very superficial and then wrapped in place with the abdomen collapsed. Vaginal hysterectomy performed followed. Vaginal hysterectomy:. Cervix was grasped placed on traction and descended to second-degree descensus. The cervix was grasped with single-tooth tenaculum, circumscribed with the Bovie cautery, And Marcane injected in the anterior vesicouterine fold. The Metzenbaum scissors the used to sharply  dissect through this cleavage plane. The uterus is retroverted. The tissues were quite  vascular. Posterior colpotomy was incision was performed. The uterosacral ligaments were then clamped cut and suture ligated on either side and this pedicle tagged. Lower cardinal ligament was clamped cut and suture ligated bilaterally. Upper cardinal ligaments were taken down in small bites sure to keep the bladder retracted anteriorly with retraction laterally. During portions of the case was difficult to identify the anterior perineal opening even noted been opened from above. We realized that we were actually burrowing into the very boggy anterior uterine wall once this was reoriented, we were able to complete the procedure with good hemostasis. Uterine vessels were easily visualized and crossclamped particular care was taken to stay close to the uterus throughout. Upon completion procedure pedicles were inspected for hemostasis. Due to the laxity of the pelvis in general. A 20 Prolenes suture was used to grasp just the last excess of the uterosacral ligament on each side incorporating approximately 1 cm of the cul-de-sac suture to reduce the size of the cul-de-sac to reduce potential for enterocele. The cuff was then closed with a series of figure-of-eight sutures anterior to posterior and the patient tolerated procedure well. Vaginal packing with Betadine soaked gauze was performed. The urine was clear and no blood noted.  Repeat laparoscopy was performed after regowning and gloving and the laparoscopy was performed to confirm pedicles were hemostatic the cuff was well closed from above, the ureters were actively peristaltic and normal caliber. Bowel was visually normal. Deflation the abdomen followed with 0 Vicryl closure of the umbilical fascia and subcuticular 4-0 Vicryl closure 3 incision sites patient tolerated procedure well went to recovery room where her pain levels were significant. She will be given PCA pump as  well as Toradol on a every 6 hours basis. Sponge and needle counts were correct

## 2014-11-23 NOTE — Brief Op Note (Signed)
11/23/2014  1:46 PM  PATIENT:  Rachel Griffith  29 y.o. female  PRE-OPERATIVE DIAGNOSIS:  pelvic pain, endometrosis, uterine retroversion  POST-OPERATIVE DIAGNOSIS:  pelvic pain, endometrosis, uterine retroversion  PROCEDURE:  Procedure(s): LAPAROSCOPIC ASSISTED VAGINAL HYSTERECTOMY WITH BILATERAL SALPINGECTOMY (Bilateral)  SURGEON:  Surgeon(s) and Role:    * [name], MD - Primary  PHYSICIAN ASSISTANT:   ASSISTANTS: [place] RN,-FA   ANESTHESIA:   local and general  EBL:  Total I/O In: 2700 [I.V.:2700] Out: 275 [Blood:275]  BLOOD ADMINISTERED:none  DRAINS: Urinary Catheter (Foley) and Vaginal pack Betadine soaked   LOCAL MEDICATIONS USED:  MARCAINE    and Amount: 20 ml  SPECIMEN:  Source of Specimen:  Uterus and cervix, bilateral fallopian tubes  DISPOSITION OF SPECIMEN:  PATHOLOGY  COUNTS:  YES  TOURNIQUET:  * No tourniquets in log *  DICTATION: .Dragon Dictation  PLAN OF CARE: Admit for overnight observation  PATIENT DISPOSITION:  PACU - hemodynamically stable.   Delay start of Pharmacological VTE agent (>24hrs) due to surgical blood loss or risk of bleeding: not applicable

## 2014-11-23 NOTE — H&P (Signed)
[name], MD at 10/27/2014 10:10 AM     Status: Signed       Expand All Collapse All   Patient ID: Rachel Griffith, female DOB: [birth year], 29 y.o. MRN: [id number]  This chart was scribed for [name], MD by Jarvis Morgan, ED Scribe. This patient was seen in room 1 and the patient's care was started at 10:10 AM.  Preoperative History and Physical  Rachel Griffith is a 29 y.o. G2P2000 here for surgical management of endometriosis.  No significant preoperative concerns. Her periods are abnormal and states she has been bleeding intermittently for 2 months with no relief. She also reports lower left sided abdominal pain onset 3 months. Pt had a transvaginal US performed 3 years ago which showed retroverted uterus, normal endometrium and multiple follicles to the left ovary. Pt is sexually active and using Mirena IUD. She has had the IUD placed 2 months ago. Her last pap smear was in October 2014. She has a h/o of post surgical incision cellulitis infection from a laparoscopy in 2014.  Proposed surgery: laparoscopy assisted vaginal hysterectomy  Past Medical History  Diagnosis Date  . Endometriosis   . Ovarian cyst   . Chronic pelvic pain in female   . Back injury   . PTSD (post-traumatic stress disorder)   . Pelvic pain in female 10/06/2014  . Endometriosis determined by laparoscopy 10/06/2014   Past Surgical History  Procedure Laterality Date  . Knee surgery    . Endometrosis  2014   OB History  Gravida Para Term Preterm AB SAB TAB Ectopic Multiple Living  # Outcome Date GA Lbr Len/2nd Weight Sex Delivery Anes PTL Lv  2 Term           1 Term             Patient denies any other pertinent gynecologic issues.   Current Outpatient Prescriptions on File Prior to Visit  Medication Sig Dispense Refill  . ALPRAZolam (XANAX) 0.25 MG tablet Take  0.25 mg by mouth as needed.     . gabapentin (NEURONTIN) 300 MG capsule Take 300 mg by mouth 3 (three) times daily.    Marland Kitchen ibuprofen (ADVIL,MOTRIN) 200 MG tablet Take 800 mg by mouth every 4 (four) hours as needed.     Marland Kitchen levonorgestrel (MIRENA) 20 MCG/24HR IUD 1 each by Intrauterine route once.    . megestrol (MEGACE) 40 MG tablet Take 3 x 5 days then 2 x 5 days then 1 daily 45 tablet 1   No current facility-administered medications on file prior to visit.   Allergies  Allergen Reactions  . Augmentin [Amoxicillin-Pot Clavulanate] Other (See Comments)    Weak and lightheaded  . Sulfa Antibiotics Hives    Social History:  reports that she has been smoking Cigarettes. She has a 1.25 pack-year smoking history. She has never used smokeless tobacco. She reports that she drinks alcohol. She reports that she does not use illicit drugs.  Family History  Problem Relation Age of Onset  . Cancer Father     prostate  . Other Sister     IBS  . Other Brother     Lyme Disease  . Dementia Maternal Grandmother   . Parkinson's disease Maternal Grandfather   . Thrombocytopenia Sister     Review of Systems:  + lower abdominal pain + vaginal bleeding No other complaints  PHYSICAL EXAM: Blood pressure  110/70, height  (1.753 m), weight 145 lb (65.772 kg), last menstrual period 09/19/2014. General appearance - alert, well appearing, and in no distress Chest - clear to auscultation, no wheezes, rales or rhonchi, symmetric air entry Heart - normal rate and regular rhythm Abdomen - soft, mild lower abdominal tenderness with fullness and guarding, no masses or organomegaly Pelvic - normal external genitalia. Diffuse sensitivity throughout  Cervix is mid position. Non purulent, IUD string not visible Uterus is retroverted. Tender to posterior contact.  Extremities - peripheral pulses normal, no pedal edema, no clubbing  or cyanosis  Labs: No results found for this or any previous visit (from the past 336 hour(s)).  Imaging Studies:  Imaging Results    No results found.    Assessment: Patient Active Problem List   Diagnosis Date Noted  . Pelvic pain in female 10/06/2014  . Endometriosis determined by laparoscopy 10/06/2014    Plan: Patient will undergo surgical management with laparoscopy assisted vaginal hysterectomy, salpingectomy, possible oophorectomy Will coordinate surgery and call pt.

## 2014-11-23 NOTE — Progress Notes (Signed)
Awake. Restless. Shivering. Uncooperative. Crying. Reassurance given. Very tense. Dr Jayme Cloud notified. Order for versed given. Versed 2 mg iv given.

## 2014-11-23 NOTE — Progress Notes (Signed)
Awake. Crying. C/O postop abd pain. Rates pain 10. Crying.

## 2014-11-23 NOTE — Progress Notes (Signed)
Sleeping quietly. resp adequate/nonlabored. O2 continued.

## 2014-11-23 NOTE — Progress Notes (Signed)
Awake. Restless shivering. C/O postop abd pain. Crying. Very tense and agitated. Reassurance given. Med as noted.

## 2014-11-23 NOTE — Anesthesia Preprocedure Evaluation (Signed)
Anesthesia Evaluation  Patient identified by MRN, date of birth, ID band Patient awake    Reviewed: Allergy & Precautions, NPO status , Patient's Chart, lab work & pertinent test results  History of Anesthesia Complications (+) PONV and history of anesthetic complications  Airway Mallampati: I  TM Distance: >3 FB     Dental  (+) Teeth Intact, Dental Advisory Given   Pulmonary Current Smoker,    breath sounds clear to auscultation       Cardiovascular negative cardio ROS   Rhythm:Regular Rate:Normal     Neuro/Psych PSYCHIATRIC DISORDERS (PTSD) Anxiety    GI/Hepatic negative GI ROS,   Endo/Other    Renal/GU      Musculoskeletal   Abdominal   Peds  Hematology   Anesthesia Other Findings   Reproductive/Obstetrics                             Anesthesia Physical Anesthesia Plan  ASA: II  Anesthesia Plan: General   Post-op Pain Management:    Induction: Intravenous  Airway Management Planned: Oral ETT  Additional Equipment:   Intra-op Plan:   Post-operative Plan: Extubation in OR  Informed Consent: I have reviewed the patients History and Physical, chart, labs and discussed the procedure including the risks, benefits and alternatives for the proposed anesthesia with the patient or authorized representative who has indicated his/her understanding and acceptance.     Plan Discussed with:   Anesthesia Plan Comments:         Anesthesia Quick Evaluation

## 2014-11-23 NOTE — Progress Notes (Signed)
From OR. Arousing. Oriented to place per nurse. Moaning/groaning. C/O postop abd pain. Shivering. Crying. Unable to console. Pain med per anesthesia.

## 2014-11-23 NOTE — Interval H&P Note (Signed)
History and Physical Interval Note:  11/23/2014 10:19 AM  Rachel Griffith  has presented today for surgery, with the diagnosis of endometrosis  The various methods of treatment have been discussed with the patient and family. After consideration of risks, benefits and other options for treatment, the patient has consented to  Procedure(s): LAPAROSCOPIC ASSISTED VAGINAL HYSTERECTOMY WITH SALPINGECTOMY (Bilateral) LAPAROSCOPIC SALPINGO OOPHORECTOMY (Bilateral) as a surgical intervention . The patient has consented for laparoscopic salpingectomy and vaginal hysterectomy, the oophorectomy has been discussed and as described in the preoperative history will only be performed if necessary due to endometriosis or surgical concerns. The patient's history has been reviewed, patient examined, no change in status, stable for surgery.  I have reviewed the patient's chart and labs.  Questions were answered to the patient's satisfaction.     [name]

## 2014-11-23 NOTE — Anesthesia Postprocedure Evaluation (Signed)
  Anesthesia Post-op Note  Patient: Rachel Griffith  Procedure(s) Performed: Procedure(s): LAPAROSCOPIC ASSISTED VAGINAL HYSTERECTOMY WITH BILATERAL SALPINGECTOMY (Bilateral)  Patient Location: PACU  Anesthesia Type:General  Level of Consciousness: awake, alert , oriented and patient cooperative  Airway and Oxygen Therapy: Patient Spontanous Breathing  Post-op Pain: 5 /10, moderate  Post-op Assessment: Post-op Vital signs reviewed, Patient's Cardiovascular Status Stable, Respiratory Function Stable and Patent Airway              Post-op Vital Signs: Reviewed and stable  Last Vitals:  Filed Vitals:   11/23/14 1035  BP: 95/66  Resp: 19    Complications: No apparent anesthesia complications

## 2014-11-24 ENCOUNTER — Encounter: Payer: Managed Care, Other (non HMO) | Admitting: Obstetrics and Gynecology

## 2014-11-24 ENCOUNTER — Observation Stay (HOSPITAL_COMMUNITY)
Admission: EM | Admit: 2014-11-24 | Discharge: 2014-11-27 | Disposition: A | Discharge status: Home / Self Care | Payer: Managed Care, Other (non HMO) | Source: Home / Self Care | Attending: Emergency Medicine | Admitting: Emergency Medicine

## 2014-11-24 ENCOUNTER — Emergency Department (HOSPITAL_COMMUNITY): Payer: Managed Care, Other (non HMO)

## 2014-11-24 ENCOUNTER — Encounter (HOSPITAL_COMMUNITY): Payer: Self-pay

## 2014-11-24 DIAGNOSIS — R102 Pelvic and perineal pain: Secondary | ICD-10-CM | POA: Diagnosis present

## 2014-11-24 DIAGNOSIS — N7011 Chronic salpingitis: Principal | ICD-10-CM | POA: Insufficient documentation

## 2014-11-24 DIAGNOSIS — N99820 Postprocedural hemorrhage and hematoma of a genitourinary system organ or structure following a genitourinary system procedure: Secondary | ICD-10-CM | POA: Insufficient documentation

## 2014-11-24 DIAGNOSIS — G8929 Other chronic pain: Secondary | ICD-10-CM | POA: Insufficient documentation

## 2014-11-24 DIAGNOSIS — F17210 Nicotine dependence, cigarettes, uncomplicated: Secondary | ICD-10-CM | POA: Insufficient documentation

## 2014-11-24 DIAGNOSIS — G8918 Other acute postprocedural pain: Secondary | ICD-10-CM | POA: Diagnosis present

## 2014-11-24 DIAGNOSIS — Z881 Allergy status to other antibiotic agents status: Secondary | ICD-10-CM | POA: Insufficient documentation

## 2014-11-24 DIAGNOSIS — R109 Unspecified abdominal pain: Secondary | ICD-10-CM

## 2014-11-24 DIAGNOSIS — F419 Anxiety disorder, unspecified: Secondary | ICD-10-CM | POA: Insufficient documentation

## 2014-11-24 DIAGNOSIS — Z882 Allergy status to sulfonamides status: Secondary | ICD-10-CM | POA: Insufficient documentation

## 2014-11-24 DIAGNOSIS — Y838 Other surgical procedures as the cause of abnormal reaction of the patient, or of later complication, without mention of misadventure at the time of the procedure: Secondary | ICD-10-CM | POA: Insufficient documentation

## 2014-11-24 DIAGNOSIS — F4310 Post-traumatic stress disorder, unspecified: Secondary | ICD-10-CM | POA: Insufficient documentation

## 2014-11-24 DIAGNOSIS — Z90710 Acquired absence of both cervix and uterus: Secondary | ICD-10-CM | POA: Diagnosis present

## 2014-11-24 LAB — CBC
HEMATOCRIT: 29.2 % — AB (ref 36.0–46.0)
Hemoglobin: 9.8 g/dL — ABNORMAL LOW (ref 12.0–15.0)
MCH: 32.8 pg (ref 26.0–34.0)
MCHC: 33.6 g/dL (ref 30.0–36.0)
MCV: 97.7 fL (ref 78.0–100.0)
Platelets: 176 10*3/uL (ref 150–400)
RBC: 2.99 MIL/uL — ABNORMAL LOW (ref 3.87–5.11)
RDW: 12 % (ref 11.5–15.5)
WBC: 11.3 10*3/uL — ABNORMAL HIGH (ref 4.0–10.5)

## 2014-11-24 LAB — BASIC METABOLIC PANEL
Anion gap: 5 (ref 5–15)
Anion gap: 6 (ref 5–15)
BUN: 12 mg/dL (ref 6–20)
BUN: 11 mg/dL (ref 6–20)
CALCIUM: 7.9 mg/dL — AB (ref 8.9–10.3)
CHLORIDE: 105 mmol/L (ref 101–111)
CO2: 27 mmol/L (ref 22–32)
CO2: 24 mmol/L (ref 22–32)
CREATININE: 0.77 mg/dL (ref 0.44–1.00)
Calcium: 7.9 mg/dL — ABNORMAL LOW (ref 8.9–10.3)
Chloride: 107 mmol/L (ref 101–111)
Creatinine, Ser: 0.76 mg/dL (ref 0.44–1.00)
GFR calc Af Amer: >60 mL/min (ref >60)
GFR calc non Af Amer: >60 mL/min (ref >60)
GLUCOSE: 102 mg/dL — AB (ref 65–99)
GLUCOSE: 101 mg/dL — AB (ref 65–99)
POTASSIUM: 4 mmol/L (ref 3.5–5.1)
Potassium: 4.2 mmol/L (ref 3.5–5.1)
Sodium: 137 mmol/L (ref 135–145)
Sodium: 137 mmol/L (ref 135–145)

## 2014-11-24 LAB — URINALYSIS, ROUTINE W REFLEX MICROSCOPIC
BILIRUBIN URINE: NEGATIVE
GLUCOSE, UA: NEGATIVE mg/dL
Ketones, ur: NEGATIVE mg/dL
Nitrite: NEGATIVE
PH: 6 (ref 5.0–8.0)
Protein, ur: NEGATIVE mg/dL
SPECIFIC GRAVITY, URINE: 1.025 (ref 1.005–1.030)
Urobilinogen, UA: 0.2 mg/dL (ref 0.0–1.0)

## 2014-11-24 LAB — CBC WITH DIFFERENTIAL/PLATELET
BASOS ABS: 0 10*3/uL (ref 0.0–0.1)
Basophils Relative: 0 %
EOS PCT: 1 %
Eosinophils Absolute: 0.1 10*3/uL (ref 0.0–0.7)
HEMATOCRIT: 28.7 % — AB (ref 36.0–46.0)
Hemoglobin: 9.7 g/dL — ABNORMAL LOW (ref 12.0–15.0)
LYMPHS ABS: 1.8 10*3/uL (ref 0.7–4.0)
LYMPHS PCT: 22 %
MCH: 33 pg (ref 26.0–34.0)
MCHC: 33.8 g/dL (ref 30.0–36.0)
MCV: 97.6 fL (ref 78.0–100.0)
MONO ABS: 0.8 10*3/uL (ref 0.1–1.0)
Monocytes Relative: 10 %
NEUTROS ABS: 5.4 10*3/uL (ref 1.7–7.7)
Neutrophils Relative %: 67 %
PLATELETS: 159 10*3/uL (ref 150–400)
RBC: 2.94 MIL/uL — ABNORMAL LOW (ref 3.87–5.11)
RDW: 12.2 % (ref 11.5–15.5)
WBC: 8.1 10*3/uL (ref 4.0–10.5)

## 2014-11-24 LAB — URINE MICROSCOPIC-ADD ON

## 2014-11-24 LAB — PREGNANCY, URINE: Preg Test, Ur: NEGATIVE

## 2014-11-24 MED ORDER — HYDROMORPHONE HCL 1 MG/ML IJ SOLN
INTRAMUSCULAR | Status: AC
  Filled 2014-11-24: qty 1

## 2014-11-24 MED ORDER — OXYCODONE-ACETAMINOPHEN 5-325 MG PO TABS
ORAL_TABLET | ORAL | Status: AC
  Filled 2014-11-24: qty 1

## 2014-11-24 MED ORDER — ALPRAZOLAM 0.25 MG PO TABS
0.2500 mg | ORAL_TABLET | Freq: Every day | ORAL | Status: DC | PRN
  Administered 2014-11-24 – 2014-11-25 (×2): 0.25 mg via ORAL
  Filled 2014-11-24 (×2): qty 1

## 2014-11-24 MED ORDER — HYDROMORPHONE HCL 1 MG/ML IJ SOLN
1.0000 mg | INTRAMUSCULAR | Status: DC | PRN
  Administered 2014-11-24 – 2014-11-26 (×10): 1 mg via INTRAVENOUS
  Filled 2014-11-24 (×10): qty 1

## 2014-11-24 MED ORDER — KCL IN DEXTROSE-NACL 20-5-0.45 MEQ/L-%-% IV SOLN
INTRAVENOUS | Status: DC
  Administered 2014-11-24 – 2014-11-25 (×2): via INTRAVENOUS

## 2014-11-24 MED ORDER — OXYCODONE-ACETAMINOPHEN 5-325 MG PO TABS
1.0000 | ORAL_TABLET | ORAL | Status: DC | PRN
  Administered 2014-11-24: 1 via ORAL
  Administered 2014-11-24 – 2014-11-25 (×4): 2 via ORAL
  Administered 2014-11-25 (×2): 1 via ORAL
  Administered 2014-11-26 (×3): 2 via ORAL
  Filled 2014-11-24: qty 2
  Filled 2014-11-24: qty 1
  Filled 2014-11-24: qty 2
  Filled 2014-11-24: qty 1
  Filled 2014-11-24: qty 2
  Filled 2014-11-24 (×2): qty 1
  Filled 2014-11-24 (×2): qty 2
  Filled 2014-11-24: qty 1
  Filled 2014-11-24 (×3): qty 2

## 2014-11-24 MED ORDER — OXYCODONE-ACETAMINOPHEN 5-325 MG PO TABS: 1.0000 | ORAL_TABLET | ORAL | Status: DC | PRN

## 2014-11-24 MED ORDER — HYDROMORPHONE HCL 1 MG/ML IJ SOLN
1.0000 mg | Freq: Once | INTRAMUSCULAR | Status: AC
  Administered 2014-11-24: 1 mg via INTRAVENOUS

## 2014-11-24 MED ORDER — ONDANSETRON HCL 4 MG/2ML IJ SOLN
4.0000 mg | Freq: Once | INTRAMUSCULAR | Status: AC
  Administered 2014-11-24: 4 mg via INTRAVENOUS
  Filled 2014-11-24: qty 2

## 2014-11-24 MED ORDER — GABAPENTIN 300 MG PO CAPS
300.0000 mg | ORAL_CAPSULE | Freq: Three times a day (TID) | ORAL | Status: DC
  Administered 2014-11-24 – 2014-11-26 (×6): 300 mg via ORAL
  Filled 2014-11-24 (×6): qty 1

## 2014-11-24 MED ORDER — IBUPROFEN 600 MG PO TABS: 600.0000 mg | ORAL_TABLET | Freq: Four times a day (QID) | ORAL | Status: DC | PRN

## 2014-11-24 MED ORDER — POLYETHYLENE GLYCOL 3350 17 G PO PACK: 17.0000 g | PACK | Freq: Every day | ORAL | Status: DC | PRN

## 2014-11-24 MED ORDER — HYDROMORPHONE HCL 1 MG/ML IJ SOLN
1.0000 mg | Freq: Once | INTRAMUSCULAR | Status: AC
  Administered 2014-11-24: 1 mg via INTRAVENOUS
  Filled 2014-11-24: qty 1

## 2014-11-24 MED ORDER — KETOROLAC TROMETHAMINE 30 MG/ML IJ SOLN
30.0000 mg | Freq: Four times a day (QID) | INTRAMUSCULAR | Status: DC
  Administered 2014-11-24 – 2014-11-26 (×5): 30 mg via INTRAVENOUS
  Filled 2014-11-24 (×4): qty 1

## 2014-11-24 MED ORDER — POLYETHYLENE GLYCOL 3350 17 GM/SCOOP PO POWD: 17.0000 g | Freq: Every day | ORAL | Status: DC

## 2014-11-24 MED ORDER — PRENATAL MULTIVITAMIN CH
1.0000 | ORAL_TABLET | Freq: Every day | ORAL | Status: DC
Start: 2014-11-25 — End: 2014-11-27
  Administered 2014-11-25: 1 via ORAL
  Filled 2014-11-24 (×5): qty 1

## 2014-11-24 MED ORDER — KETOROLAC TROMETHAMINE 30 MG/ML IJ SOLN
30.0000 mg | Freq: Four times a day (QID) | INTRAMUSCULAR | Status: DC
  Filled 2014-11-24: qty 1

## 2014-11-24 NOTE — Care Management Note (Signed)
Case Management Note  Patient Details  Name: JUDITHE KEETCH MRN: [id number] Date of Birth: 1986/01/20  Expected Discharge Date:    11/24/2014              Expected Discharge Plan:  Home/Self Care  In-House Referral:  NA  Discharge planning Services  CM Consult  Post Acute Care Choice:  NA Choice offered to:  NA  DME Arranged:    DME Agency:     HH Arranged:    HH Agency:     Status of Service:  Completed, signed off  Medicare Important Message Given:    Date Medicare IM Given:    Medicare IM give by:    Date Additional Medicare IM Given:    Additional Medicare Important Message give by:     If discussed at Long Length of Stay Meetings, dates discussed:    Additional Comments: Pt is from home, and ind at baseline. Pt admitted obs s/p lap assisted vag hysterectomy. Pt discharging home today with no CM needs.  Malcolm Metro, RN 11/24/2014, 10:35 AM

## 2014-11-24 NOTE — Progress Notes (Addendum)
Wasted 10 ml of Dilaudid from PCA after patient was discharged.  Wasted with Arloa Koh, RN.  Witnessed [employer] 10ml of Dilaudid PCA in sink. Schonewitz, Candelaria Stagers

## 2014-11-24 NOTE — ED Provider Notes (Signed)
CSN: [id number]     Arrival date & time 11/24/14  1748 History   First MD Initiated Contact with Patient 11/24/14 1843     Chief Complaint  Patient presents with  . Pelvic Pain     (Consider location/radiation/quality/duration/timing/severity/associated sxs/prior Treatment) HPI Comments:  Patient presents with severe pelvic pain after ongoing robotic hysterectomy by Dr. Emelda Fear yesterday. She was discharged from hospital this morning and states she was feeling better. Her pain is uncontrolled with Percocet at home tonight. She denies any fever her vomiting but has had some nausea. She did eat a sandwich. She denies any significant vaginal bleeding. No dysuria or hematuria. The pain is severe mostly around her umbilicus. She still has her ovaries by her tubes and uterus were removed. She denies any chest pain or shortness of breath. There is diffuse low back pain as well.  The history is provided by the patient.    Past Medical History  Diagnosis Date  . Endometriosis   . Ovarian cyst   . Chronic pelvic pain in female   . Back injury   . PTSD (post-traumatic stress disorder)   . Pelvic pain in female 10/06/2014  . Endometriosis determined by laparoscopy 10/06/2014  . PONV (postoperative nausea and vomiting)     pt states that with 1st arthroscopy she "passed out and couldnt left her head" but they tell me it was a combination of the pain meds and anesthesia" No problems with next surgery. Not sure of meds given in anesthesia.  Marland Kitchen Anxiety    Past Surgical History  Procedure Laterality Date  . Knee surgery Bilateral     x2  . Endometrosis  2014    fulgeration-[facility]  . Abdominal hysterectomy     Family History  Problem Relation Age of Onset  . Cancer Father     prostate  . Other Sister     IBS  . Other Brother     Lyme Disease  . Dementia Maternal Grandmother   . Parkinson's disease Maternal Grandfather   . Thrombocytopenia Sister    Social History  Substance Use Topics   . Smoking status: Current Some Day Smoker -- 0.25 packs/day for 5 years    Types: Cigarettes  . Smokeless tobacco: Never Used     Comment: smokes 1-2 cig daily  . Alcohol Use: Yes     Comment: occ   OB History    Gravida Para Term Preterm AB TAB SAB Ectopic Multiple Living   Review of Systems  Constitutional: Positive for activity change and appetite change. Negative for fever.  Respiratory: Negative for cough, chest tightness and shortness of breath.   Gastrointestinal: Positive for nausea and abdominal pain. Negative for vomiting and diarrhea.  Genitourinary: Positive for vaginal bleeding and pelvic pain. Negative for dysuria, hematuria and difficulty urinating.  Musculoskeletal: Negative for myalgias, back pain and arthralgias.  Skin: Negative for rash.  Neurological: Negative for dizziness, weakness and headaches.  A complete 10 system review of systems was obtained and all systems are negative except as noted in the HPI and PMH.      Allergies  Augmentin and Sulfa antibiotics  Home Medications   Prior to Admission medications   Medication Sig Start Date End Date Taking? Authorizing Provider  ALPRAZolam Prudy Feeler) 0.25 MG tablet Take 0.25 mg by mouth daily as needed for anxiety.    Yes Historical Provider, MD  gabapentin (NEURONTIN) 300 MG  capsule Take 300 mg by mouth 3 (three) times daily.   Yes Historical Provider, MD  ibuprofen (ADVIL,MOTRIN) 600 MG tablet Take 1 tablet (600 mg total) by mouth every 6 (six) hours as needed (mild pain). 11/24/14  Yes [name], MD  oxyCODONE-acetaminophen (PERCOCET/ROXICET) 5-325 MG tablet Take 1-2 tablets by mouth every 4 (four) hours as needed for severe pain (moderate to severe pain (when tolerating fluids)). 11/24/14  Yes [name], MD  polyethylene glycol powder (MIRALAX) powder Take 17 g by mouth daily. To prevent constipation 11/24/14  Yes [name], MD   BP 100/48 mmHg  Pulse 66  Temp(Src) 98.6 F  (37 C) (Oral)  Resp 20  Ht  (1.753 m)  Wt 152 lb 6.4 oz (69.128 kg)  BMI 22.50 kg/m2  SpO2 99%  LMP 09/19/2014 Physical Exam  Constitutional: She is oriented to person, place, and time. She appears well-developed and well-nourished. She appears distressed.  uncomfortable  HENT:  Head: Normocephalic and atraumatic.  Mouth/Throat: Oropharynx is clear and moist. No oropharyngeal exudate.  Eyes: Conjunctivae and EOM are normal. Pupils are equal, round, and reactive to light.  Neck: Normal range of motion. Neck supple.  No meningismus.  Cardiovascular: Normal rate, regular rhythm, normal heart sounds and intact distal pulses.   No murmur heard. Pulmonary/Chest: Effort normal and breath sounds normal. No respiratory distress.  Abdominal: Soft. There is tenderness. There is no rebound and no guarding.   Surgical incisions appear to be healing well. There is significant periumbilical and superpubic tenderness with voluntary guarding.  Musculoskeletal: Normal range of motion. She exhibits no edema or tenderness.  Neurological: She is alert and oriented to person, place, and time. No cranial nerve deficit. She exhibits normal muscle tone. Coordination normal.  No ataxia on finger to nose bilaterally. No pronator drift. 5/5 strength throughout. CN 2-12 intact. Negative Romberg. Equal grip strength. Sensation intact. Gait is normal.   Skin: Skin is warm.  Psychiatric: She has a normal mood and affect. Her behavior is normal.  Nursing note and vitals reviewed.   ED Course  Procedures (including critical care time) Labs Review Labs Reviewed  URINALYSIS, ROUTINE W REFLEX MICROSCOPIC (NOT AT [facility]) - Abnormal; Notable for the following:    APPearance HAZY (*)    Hgb urine dipstick LARGE (*)    Leukocytes, UA TRACE (*)    All other components within normal limits  CBC WITH DIFFERENTIAL/PLATELET - Abnormal; Notable for the following:    RBC 2.94 (*)    Hemoglobin 9.7 (*)    HCT 28.7 (*)     All other components within normal limits  BASIC METABOLIC PANEL - Abnormal; Notable for the following:    Glucose, Bld 101 (*)    Calcium 7.9 (*)    All other components within normal limits  URINE MICROSCOPIC-ADD ON - Abnormal; Notable for the following:    Squamous Epithelial / LPF MANY (*)    All other components within normal limits  PREGNANCY, URINE  BASIC METABOLIC PANEL  CBC    Imaging Review No results found. I have personally reviewed and evaluated these images and lab results as part of my medical decision-making.   EKG Interpretation None      MDM   Final diagnoses:  Postoperative pain    postoperative abdominal pain and pelvic pain after undergoing hysterectomy yesterday.  vitals are stable. Afebrile.  Abdomen soft without peritoneal signs.   Hemoglobin is stable. UA with hematuria, no infection.  Discussed with Dr. Emelda Fear. He is discussed with patient herself and agreed to overnight admission for pain control. He does not feel any imaging is indicated at this time.   Patient's pain improving with IV Dilaudid. Plan admission for observation per Dr. Emelda Fear.  Glynn Octave, MD 11/25/14 [phone]

## 2014-11-24 NOTE — ED Notes (Signed)
Pt reports had vaginal hysterectomy with laparoscopic assist yesterday and was discharged today.  Pt says unable to control pain at home.  Pt says pain is on the left side of lower abd.

## 2014-11-24 NOTE — Progress Notes (Signed)
Discussed with the patient and all questioned fully answered. Discharge education included smoking cessation, incision care, follow up appointments, and pain control. Mother was also present for discharge education. She will call me if any problems arise.  Leonidas Romberg, RN

## 2014-11-24 NOTE — Discharge Instructions (Signed)
Your surgery was laparoscopic, but these general guidelines apply, call our office for concerns  Abdominal Hysterectomy, Care After Refer to this sheet in the next few weeks. These instructions provide you with information on caring for yourself after your procedure. Your health care provider may also give you more specific instructions. Your treatment has been planned according to current medical practices, but problems sometimes occur. Call your health care provider if you have any problems or questions after your procedure.  WHAT TO EXPECT AFTER THE PROCEDURE After your procedure, it is typical to have the following:  Pain.  Feeling tired.  Poor appetite.  Less interest in sex. HOME CARE INSTRUCTIONS  It takes 4-6 weeks to recover from this surgery. Make sure you follow all your health care provider's instructions. Home care instructions may include:  Take pain medicines only as directed by your health care provider. Do not take over-the-counter pain medicines without checking with your health care provider first.  Change your bandage as directed by your health care provider.  Return to your health care provider to have your sutures taken out.  Take showers instead of baths for 2-3 weeks. Ask your health care provider when it is safe to start showering.  Do not douche, use tampons, or have sexual intercourse for at least 6 weeks or until your health care provider says you can.   Follow your health care provider's advice about exercise, lifting, driving, and general activities.  Get plenty of rest and sleep.   Do not lift anything heavier than a gallon of milk (about 10 lb [4.5 kg]) for the first month after surgery.  You can resume your normal diet if your health care provider says it is okay.   Do not drink alcohol until your health care provider says you can.   If you are constipated, ask your health care provider if you can take a mild laxative.  Eating foods high  in fiber may also help with constipation. Eat plenty of raw fruits and vegetables, whole grains, and beans.  Drink enough fluids to keep your urine clear or pale yellow.   Try to have someone at home with you for the first 1-2 weeks to help around the house.  Keep all follow-up appointments. SEEK MEDICAL CARE IF:   You have chills or fever.  You have swelling, redness, or pain in the area of your incision that is getting worse.   You have pus coming from the incision.   You notice a bad smell coming from the incision or bandage.   Your incision breaks open.   You feel dizzy or light-headed.   You have pain or bleeding when you urinate.   You have persistent diarrhea.   You have persistent nausea and vomiting.   You have abnormal vaginal discharge.   You have a rash.   You have any type of abnormal reaction or develop an allergy to your medicine.   Your pain medicine is not helping.  SEEK IMMEDIATE MEDICAL CARE IF:   You have a fever and your symptoms suddenly get worse.  You have severe abdominal pain.  You have chest pain.  You have shortness of breath.  You faint.  You have pain, swelling, or redness of your leg.  You have heavy vaginal bleeding with blood clots. MAKE SURE YOU:  Understand these instructions.  Will watch your condition.  Will get help right away if you are not doing well or get worse. Document Released: 09/01/2004 Document Revised:  02/17/2013 Document Reviewed: 12/05/2012 ExitCare Patient Information 2015 [place], [place]. This information is not intended to replace advice given to you by your health care provider. Make sure you discuss any questions you have with your health care provider.

## 2014-11-24 NOTE — Progress Notes (Signed)
1 Day Post-Op Procedure(s) (LRB): LAPAROSCOPIC ASSISTED VAGINAL HYSTERECTOMY WITH BILATERAL SALPINGECTOMY (Bilateral)  Subjective: Patient reports incisional pain, tolerating PO and no problems voiding.  Nursing will remove vaginal pack now  Objective: I have reviewed patient's vital signs, intake and output, medications and labs. CBC    Component Value Date/Time   WBC 11.3* 11/24/2014 0529   RBC 2.99* 11/24/2014 0529   HGB 9.8* 11/24/2014 0529   HCT 29.2* 11/24/2014 0529   PLT 176 11/24/2014 0529   MCV 97.7 11/24/2014 0529   MCH 32.8 11/24/2014 0529   MCHC 33.6 11/24/2014 0529   RDW 12.0 11/24/2014 0529   LYMPHSABS 1.5 01/03/2014 1239   MONOABS 0.6 01/03/2014 1239   EOSABS 0.0 01/03/2014 1239   BASOSABS 0.1 01/03/2014 1239   BMP Latest Ref Rng 11/24/2014 11/18/2014 01/03/2014  Glucose 65 - 99 mg/dL 161(W) 87 960(A)  BUN 6 - 20 mg/dL Creatinine 0.44 - 1.00 mg/dL 5.40 9.81 1.91  Sodium 135 - 145 mmol/L 137 136 138  Potassium 3.5 - 5.1 mmol/L 4.2 4.5 3.9  Chloride 101 - 111 mmol/L 105 104 103  CO2 22 - 32 mmol/L Calcium 8.9 - 10.3 mg/dL 7.9(L) 8.7(L) 9.3      General: alert, cooperative and mild distress Resp: clear to auscultation bilaterally GI: soft, non-tender; bowel sounds normal; no masses,  no organomegaly and incision: clean, dry and intact Vaginal Bleeding: none  Assessment: s/p Procedure(s): LAPAROSCOPIC ASSISTED VAGINAL HYSTERECTOMY WITH BILATERAL SALPINGECTOMY (Bilateral): stable  Plan: Advance diet Advance to PO medication Discontinue IV fluids Discharge home     FERGUSON,JOHN V 11/24/2014, 8:51 AM

## 2014-11-24 NOTE — H&P (Signed)
Rachel Griffith is an 29 y.o. female. She is placed in overnight observation for pain control. She is now status post laparoscopically assisted vaginal hysterectomy that was notable for slightly greater than average blood loss, but was considered straightforward beyond that. She had a great deal of pain immediately in recovery room but this subsided shortly after arrival on the floor. When seen this morning for routine postop care she was quite comfortable with her pain control which is being managed with the Dilaudid PCA, as well as Toradol. She had converted to Percocet orally and was felt stable for going home. This evening she awoke from a nap, and a lounge chair and found her pain is adequately controlled with the Percocet so she will return to the emergency room for further assessment. The laboratories suggest hemoglobin remained stable, and the patient has responded well to IV Dilaudid. The patient will be admitted overnight for pain management and reassessment of outpatient medications.  Pertinent Gynecological History: Menses:  Bleeding: Minimal bleeding at this time Contraception: status post hysterectomy DES exposure:  Blood transfusions: none Sexually transmitted diseases: no past history Previous GYN Procedures: Laparoscopically assisted vaginal hysterectomy 11/23/2014  Last mammogram:  Date:  Last pap:  Date:  OB History: G 2, P 2   Menstrual History: Menarche age:  Patient's last menstrual period was 09/19/2014.    Past Medical History  Diagnosis Date  . Endometriosis   . Ovarian cyst   . Chronic pelvic pain in female   . Back injury   . PTSD (post-traumatic stress disorder)   . Pelvic pain in female 10/06/2014  . Endometriosis determined by laparoscopy 10/06/2014  . PONV (postoperative nausea and vomiting)     pt states that with 1st arthroscopy she "passed out and couldnt left her head" but they tell me it was a combination of the pain meds and anesthesia" No problems  with next surgery. Not sure of meds given in anesthesia.  Marland Kitchen Anxiety     Past Surgical History  Procedure Laterality Date  . Knee surgery Bilateral     x2  . Endometrosis  2014    fulgeration-[facility]  . Abdominal hysterectomy      Family History  Problem Relation Age of Onset  . Cancer Father     prostate  . Other Sister     IBS  . Other Brother     Lyme Disease  . Dementia Maternal Grandmother   . Parkinson's disease Maternal Grandfather   . Thrombocytopenia Sister     Social History:  reports that she has been smoking Cigarettes.  She has a 1.25 pack-year smoking history. She has never used smokeless tobacco. She reports that she drinks alcohol. She reports that she does not use illicit drugs.  Allergies:  Allergies  Allergen Reactions  . Augmentin [Amoxicillin-Pot Clavulanate] Other (See Comments)    Weak and lightheaded  . Sulfa Antibiotics Hives     (Not in a hospital admission)  ROS  Blood pressure 113/52, pulse 82, temperature 98.4 F (36.9 C), temperature source Oral, resp. rate 20, height  (1.753 m), weight 62.596 kg (138 lb), last menstrual period 09/19/2014, SpO2 100 %. Physical Exam C emergency room physician physician Dr. Randel Books note Results for orders placed or performed during the hospital encounter of 11/24/14 (from the past 24 hour(s))  Urinalysis, Routine w reflex microscopic (not at [facility])     Status: Abnormal   Collection Time: 11/24/14  7:00 PM  Result Value Ref Range  Color, Urine YELLOW YELLOW   APPearance HAZY (A) CLEAR   Specific Gravity, Urine 1.025 1.005 - 1.030   pH 6.0 5.0 - 8.0   Glucose, UA NEGATIVE NEGATIVE mg/dL   Hgb urine dipstick LARGE (A) NEGATIVE   Bilirubin Urine NEGATIVE NEGATIVE   Ketones, ur NEGATIVE NEGATIVE mg/dL   Protein, ur NEGATIVE NEGATIVE mg/dL   Urobilinogen, UA 0.2 0.0 - 1.0 mg/dL   Nitrite NEGATIVE NEGATIVE   Leukocytes, UA TRACE (A) NEGATIVE  Pregnancy, urine     Status: None   Collection Time:  11/24/14  7:00 PM  Result Value Ref Range   Preg Test, Ur NEGATIVE NEGATIVE  Urine microscopic-add on     Status: Abnormal   Collection Time: 11/24/14  7:00 PM  Result Value Ref Range   Squamous Epithelial / LPF MANY (A) RARE   WBC, UA 0-2 <3 WBC/hpf   RBC / HPF 7-10 <3 RBC/hpf   Bacteria, UA RARE RARE  CBC with Differential/Platelet     Status: Abnormal   Collection Time: 11/24/14  7:10 PM  Result Value Ref Range   WBC 8.1 4.0 - 10.5 K/uL   RBC 2.94 (L) 3.87 - 5.11 MIL/uL   Hemoglobin 9.7 (L) 12.0 - 15.0 g/dL   HCT 16.1 (L) 09.6 - 04.5 %   MCV 97.6 78.0 - 100.0 fL   MCH 33.0 26.0 - 34.0 pg   MCHC 33.8 30.0 - 36.0 g/dL   RDW 40.9 81.1 - 91.4 %   Platelets 159 150 - 400 K/uL   Neutrophils Relative % 67 %   Neutro Abs 5.4 1.7 - 7.7 K/uL   Lymphocytes Relative 22 %   Lymphs Abs 1.8 0.7 - 4.0 K/uL   Monocytes Relative 10 %   Monocytes Absolute 0.8 0.1 - 1.0 K/uL   Eosinophils Relative 1 %   Eosinophils Absolute 0.1 0.0 - 0.7 K/uL   Basophils Relative 0 %   Basophils Absolute 0.0 0.0 - 0.1 K/uL    No results found.  Assessment/Plan: Postoperative pain post LAVH, and adequate oral pain med control. Observation for overnight observation for pain management readjustment  FERGUSON,JOHN V 11/24/2014, 7:50 PM

## 2014-11-24 NOTE — Progress Notes (Signed)
Vaginal packing removed by Inetta Fermo, RN, patient tolerated well, packing saturated, patient resting in bed at this time, pain medication given per PRN order.  Will continue to monitor.

## 2014-11-25 ENCOUNTER — Observation Stay (HOSPITAL_COMMUNITY): Payer: Managed Care, Other (non HMO)

## 2014-11-25 LAB — CBC WITH DIFFERENTIAL/PLATELET
Basophils Absolute: 0 10*3/uL (ref 0.0–0.1)
Basophils Relative: 0 %
EOS ABS: 0.1 10*3/uL (ref 0.0–0.7)
Eosinophils Relative: 3 %
HCT: 27.5 % — ABNORMAL LOW (ref 36.0–46.0)
HEMOGLOBIN: 9.2 g/dL — AB (ref 12.0–15.0)
LYMPHS ABS: 1.3 10*3/uL (ref 0.7–4.0)
LYMPHS PCT: 23 %
MCH: 33 pg (ref 26.0–34.0)
MCHC: 33.5 g/dL (ref 30.0–36.0)
MCV: 98.6 fL (ref 78.0–100.0)
MONOS PCT: 9 %
Monocytes Absolute: 0.5 10*3/uL (ref 0.1–1.0)
NEUTROS PCT: 65 %
Neutro Abs: 3.6 10*3/uL (ref 1.7–7.7)
Platelets: 135 10*3/uL — ABNORMAL LOW (ref 150–400)
RBC: 2.79 MIL/uL — ABNORMAL LOW (ref 3.87–5.11)
RDW: 12 % (ref 11.5–15.5)
WBC: 5.5 10*3/uL (ref 4.0–10.5)

## 2014-11-25 LAB — PREPARE RBC (CROSSMATCH)

## 2014-11-25 LAB — BASIC METABOLIC PANEL
ANION GAP: 2 — AB (ref 5–15)
Anion gap: 5 (ref 5–15)
BUN: 7 mg/dL (ref 6–20)
CHLORIDE: 105 mmol/L (ref 101–111)
CO2: 26 mmol/L (ref 22–32)
CO2: 26 mmol/L (ref 22–32)
CREATININE: 0.7 mg/dL (ref 0.44–1.00)
Calcium: 7.6 mg/dL — ABNORMAL LOW (ref 8.9–10.3)
Calcium: 8 mg/dL — ABNORMAL LOW (ref 8.9–10.3)
Chloride: 110 mmol/L (ref 101–111)
Creatinine, Ser: 0.75 mg/dL (ref 0.44–1.00)
GFR calc Af Amer: >60 mL/min (ref >60)
GFR calc Af Amer: >60 mL/min (ref >60)
GFR calc non Af Amer: >60 mL/min (ref >60)
GFR calc non Af Amer: >60 mL/min (ref >60)
GLUCOSE: 107 mg/dL — AB (ref 65–99)
GLUCOSE: 124 mg/dL — AB (ref 65–99)
POTASSIUM: 4.1 mmol/L (ref 3.5–5.1)
Potassium: 3.8 mmol/L (ref 3.5–5.1)
SODIUM: 136 mmol/L (ref 135–145)
Sodium: 138 mmol/L (ref 135–145)

## 2014-11-25 LAB — CBC
HEMATOCRIT: 26.1 % — AB (ref 36.0–46.0)
HEMOGLOBIN: 8.8 g/dL — AB (ref 12.0–15.0)
MCH: 33.5 pg (ref 26.0–34.0)
MCHC: 33.7 g/dL (ref 30.0–36.0)
MCV: 99.2 fL (ref 78.0–100.0)
Platelets: 125 10*3/uL — ABNORMAL LOW (ref 150–400)
RBC: 2.63 MIL/uL — AB (ref 3.87–5.11)
RDW: 12.2 % (ref 11.5–15.5)
WBC: 5.8 10*3/uL (ref 4.0–10.5)

## 2014-11-25 IMAGING — CT CT ABD-PELV W/ CM
2 of 5 series · 16 of 46 positions shown, 18 images · IV contrast (Omnipaque 300)
Comparison: None.

CLINICAL DATA: Recent vaginal hysterectomy.  Worsening pelvic pain

EXAM:
CT ABDOMEN AND PELVIS WITH CONTRAST
TECHNIQUE: Multidetector CT imaging of the abdomen and pelvis was performed
using the standard protocol following bolus administration of
intravenous contrast.
CONTRAST:  50mL OMNIPAQUE IOHEXOL 300 MG/ML SOLN, 100mL OMNIPAQUE
IOHEXOL 300 MG/ML SOLN

[Series 2: abd_pel_with 5.0 b40f · axial · 0.63mm/px · z∈[-186,+194]mm · 13 of 90 slices shown, 15 images]
[im 7/90  soft-tissue]
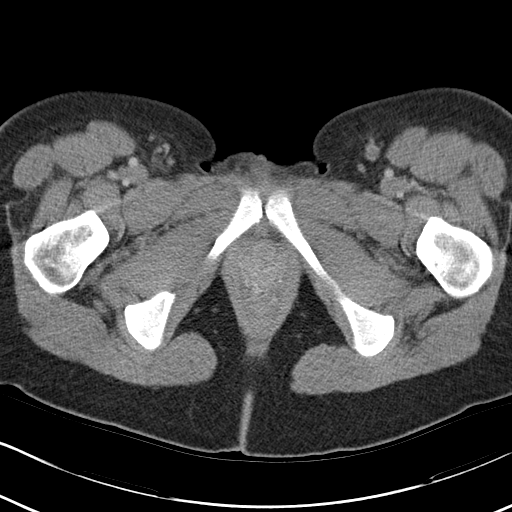
[im 7/90  bone]
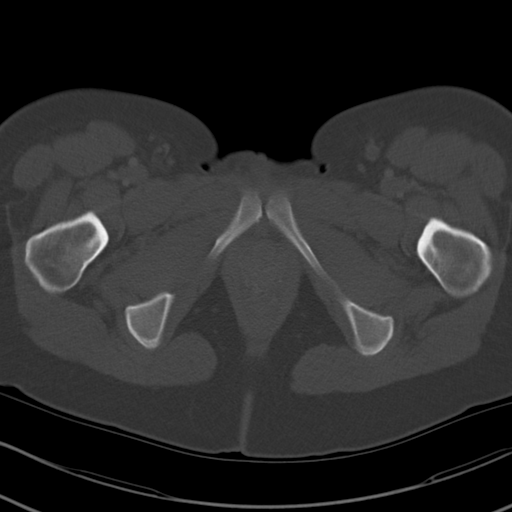
[im 13/90  soft-tissue]
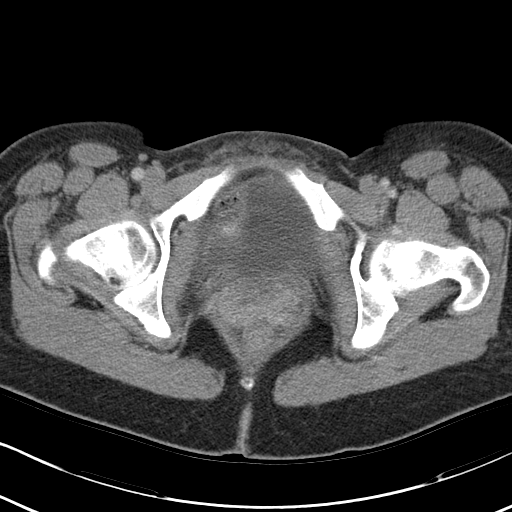
[im 20/90  soft-tissue]
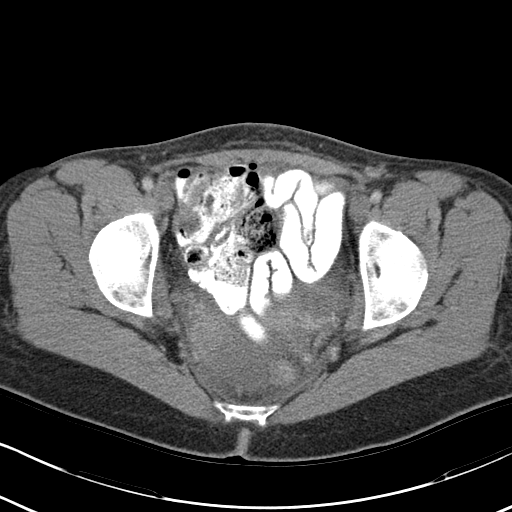
[im 26/90  soft-tissue]
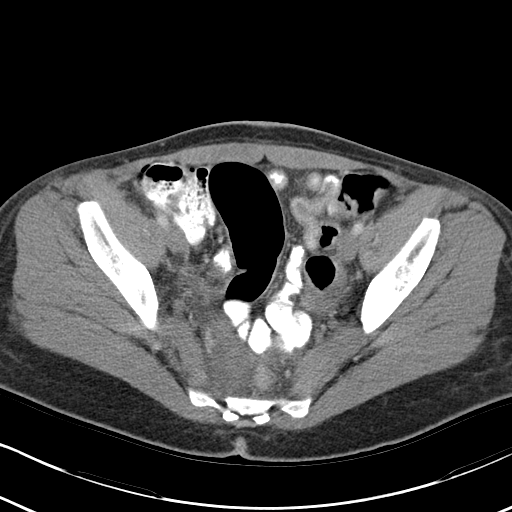
[im 32/90  soft-tissue]
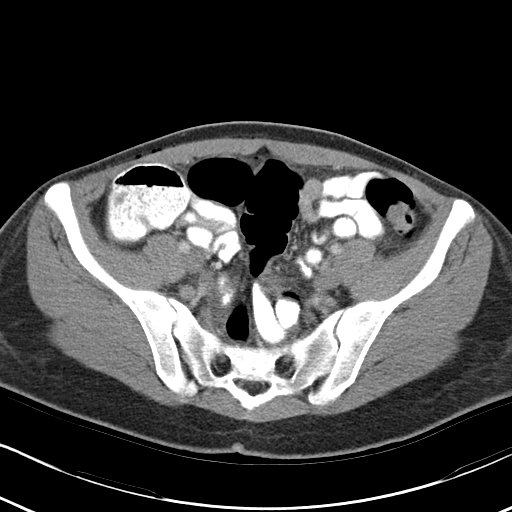
[im 39/90  soft-tissue]
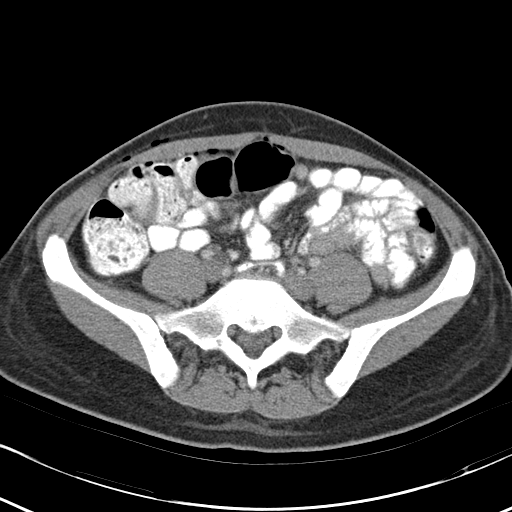
[im 45/90  soft-tissue]
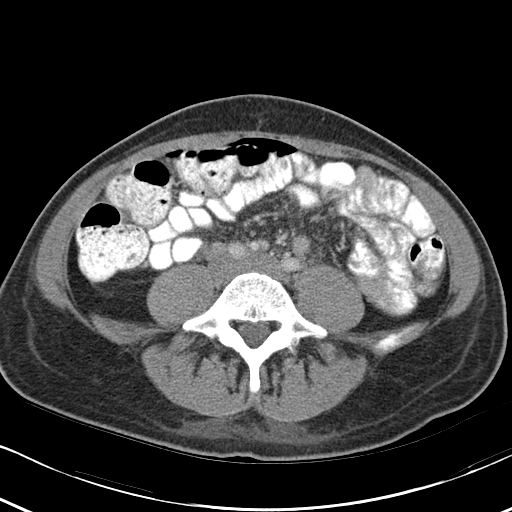
[im 51/90  soft-tissue]
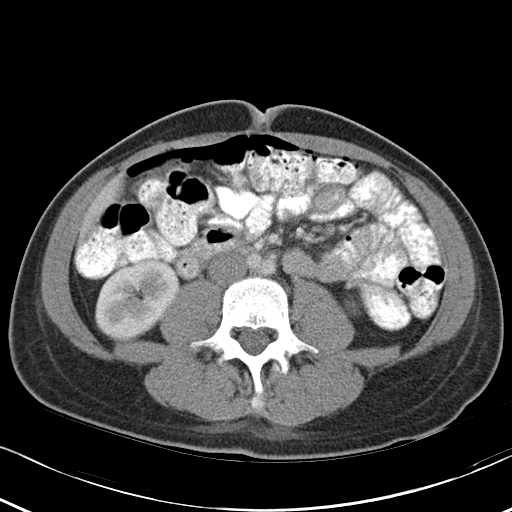
[im 58/90  soft-tissue]
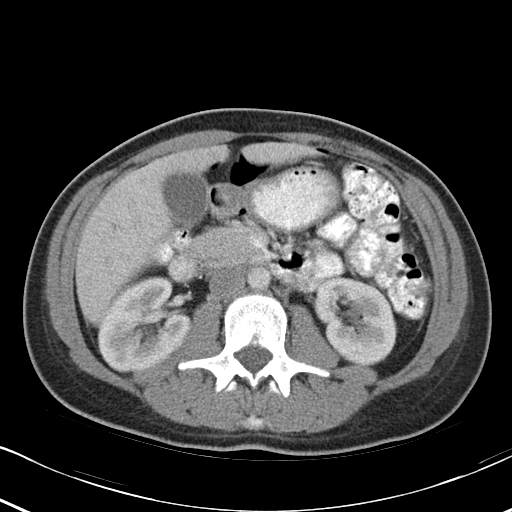
[im 58/90  bone]
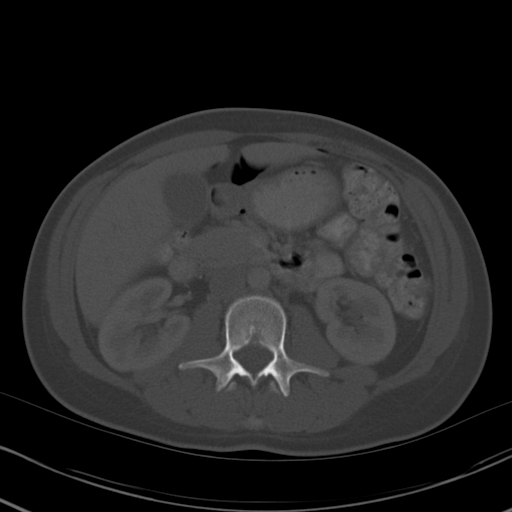
[im 64/90  soft-tissue]
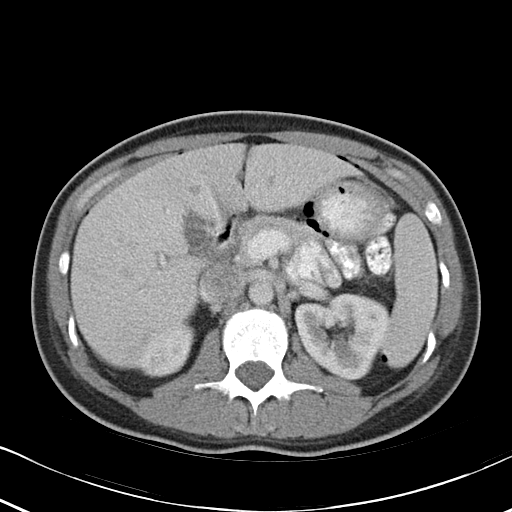
[im 70/90  soft-tissue]
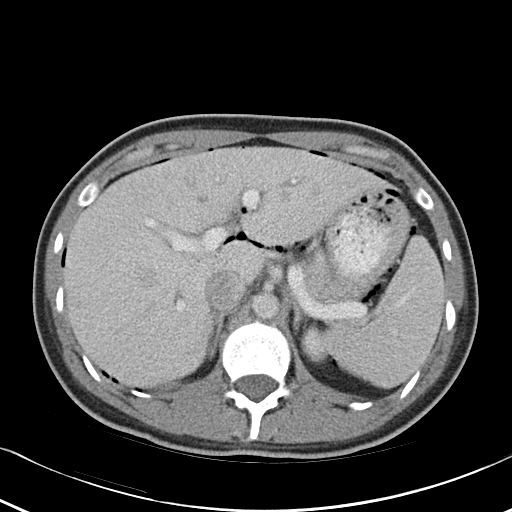
[im 77/90  soft-tissue]
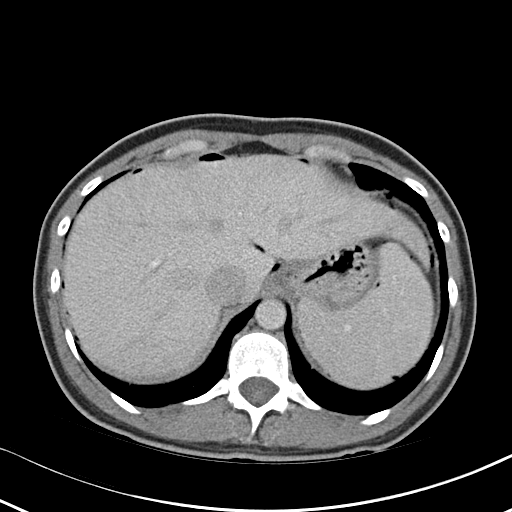
[im 83/90  soft-tissue]
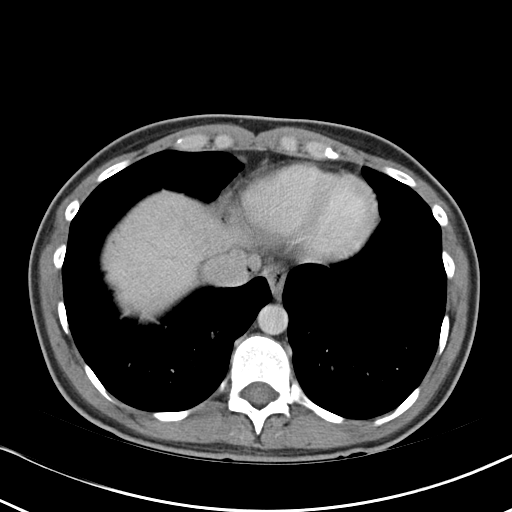

[Series 4: abd_pel_with 3.0 spo cor · coronal · 0.66mm/px · 3 of 71 slices shown]
[im 24/71  soft-tissue]
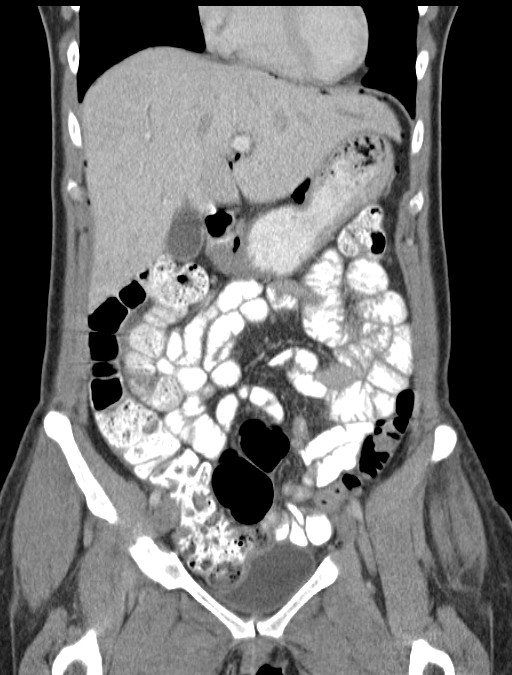
[im 32/71  soft-tissue]
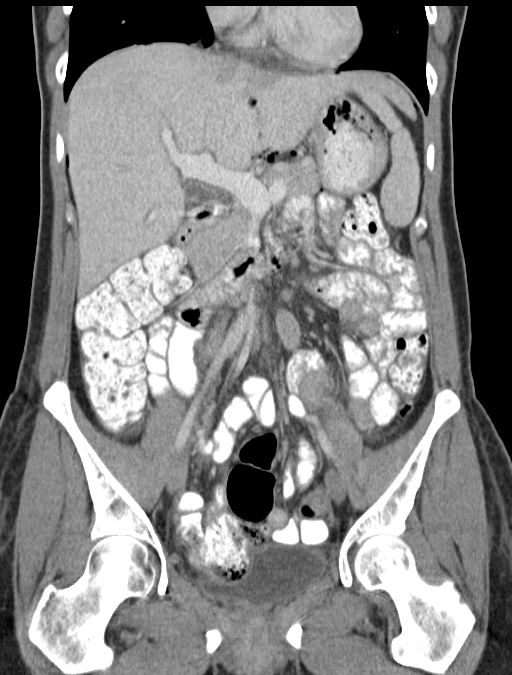
[im 39/71  soft-tissue]
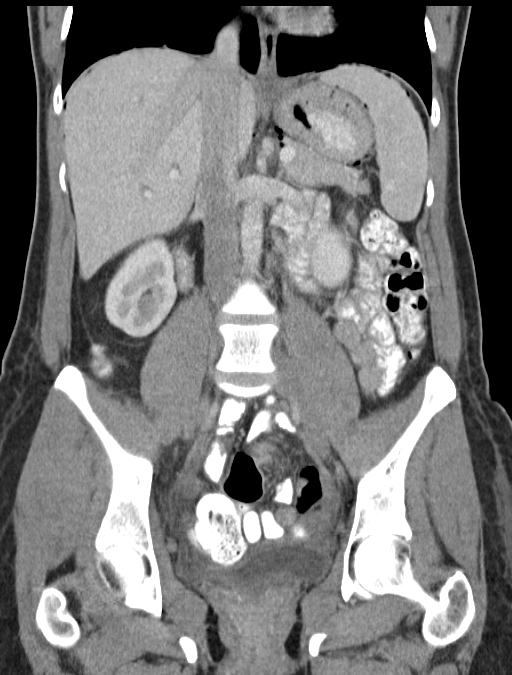

[16 of 46 positions shown; findings below may reference images not displayed]

FINDINGS: Lower chest: No pleural or pericardial effusion. The lung bases are
clear.

Hepatobiliary: There is no focal liver abnormality. The gallbladder
is normal. No biliary dilatation. Mild periportal edema noted.

Pancreas: Normal appearance of the pancreas.

Spleen: Negative

Adrenals/Urinary Tract: The adrenal glands are normal. Normal
appearance of both kidneys. Urinary bladder appears normal for
degree of distention.

Stomach/Bowel: The stomach is within normal limits. The small bowel
loops have a normal course and caliber. No obstruction. Normal
appearance of the colon.

Vascular/Lymphatic: Normal appearance of the abdominal aorta. No
enlarged retroperitoneal or mesenteric adenopathy. No enlarged
pelvic or inguinal lymph nodes.

Reproductive: Postoperative changes compatible with hysterectomy.
There is moderate complex fluid within the dependent portion of the
pelvis which measures 4.6 x 4.5 x 5.3 cm and measures 26 Hounsfield
units.

Other: Free intraperitoneal air is identified. Nonspecific in the
early postoperative time frame.

Musculoskeletal: No worrisome bone abnormalities. No aggressive
lytic or sclerotic bone lesions.
IMPRESSION: 1. Postoperative changes compatible with recent hysterectomy.
2. There is nonspecific, complex fluid within the dependent portion
of the pelvis. This could represent postoperative hematoma, seroma,
or phlegmon formation. At this time there is no well-formed
drainable abscess. If patient's symptoms persist or worsen then
consider repeat imaging with pelvic CT.

## 2014-11-25 MED ORDER — IOHEXOL 300 MG/ML  SOLN
50.0000 mL | Freq: Once | INTRAMUSCULAR | Status: AC | PRN
  Administered 2014-11-25: 50 mL via ORAL

## 2014-11-25 MED ORDER — ACETAMINOPHEN 325 MG PO TABS
650.0000 mg | ORAL_TABLET | Freq: Once | ORAL | Status: AC
  Administered 2014-11-25: 650 mg via ORAL
  Filled 2014-11-25: qty 2

## 2014-11-25 MED ORDER — SODIUM CHLORIDE 0.9 % IV SOLN
Freq: Once | INTRAVENOUS | Status: AC
  Administered 2014-11-25: via INTRAVENOUS

## 2014-11-25 MED ORDER — DIPHENHYDRAMINE HCL 25 MG PO CAPS
25.0000 mg | ORAL_CAPSULE | Freq: Once | ORAL | Status: AC
  Administered 2014-11-25: 25 mg via ORAL
  Filled 2014-11-25: qty 1

## 2014-11-25 MED ORDER — IOHEXOL 300 MG/ML  SOLN
100.0000 mL | Freq: Once | INTRAMUSCULAR | Status: AC | PRN
  Administered 2014-11-25: 100 mL via INTRAVENOUS

## 2014-11-25 MED ORDER — SODIUM CHLORIDE 0.9 % IV SOLN
INTRAVENOUS | Status: DC
  Administered 2014-11-25: via INTRAVENOUS

## 2014-11-25 NOTE — Care Management Note (Signed)
Case Management Note  Patient Details  Name: Rachel Griffith MRN: [id number] Date of Birth: December 18, 1985  Subjective/Objective:                  Pt admitted from home with post up pain uncontrolled. Pt lives with family and will return home at discharge. Pt is independent with ADl's.  Action/Plan: No CM needs noted.   Expected Discharge Date:  11/24/14               Expected Discharge Plan:  Home/Self Care  In-House Referral:  NA  Discharge planning Services  CM Consult  Post Acute Care Choice:  NA Choice offered to:  NA  DME Arranged:    DME Agency:     HH Arranged:    HH Agency:     Status of Service:  Completed, signed off  Medicare Important Message Given:    Date Medicare IM Given:    Medicare IM give by:    Date Additional Medicare IM Given:    Additional Medicare Important Message give by:     If discussed at Long Length of Stay Meetings, dates discussed:    Additional Comments:  Cheryl Flash, RN 11/25/2014, 11:04 AM

## 2014-11-26 ENCOUNTER — Observation Stay (HOSPITAL_COMMUNITY): Payer: Managed Care, Other (non HMO) | Admitting: Anesthesiology

## 2014-11-26 ENCOUNTER — Encounter (HOSPITAL_COMMUNITY)
Admission: EM | Disposition: A | Discharge status: Home / Self Care | Payer: Self-pay | Source: Home / Self Care | Attending: Emergency Medicine

## 2014-11-26 ENCOUNTER — Encounter (HOSPITAL_COMMUNITY): Payer: Self-pay | Admitting: *Deleted

## 2014-11-26 HISTORY — PX: LAPAROSCOPY: SHX197

## 2014-11-26 LAB — CBC
HCT: 30.7 % — ABNORMAL LOW (ref 36.0–46.0)
HEMOGLOBIN: 10.5 g/dL — AB (ref 12.0–15.0)
MCH: 32.5 pg (ref 26.0–34.0)
MCHC: 34.2 g/dL (ref 30.0–36.0)
MCV: 95 fL (ref 78.0–100.0)
Platelets: 134 10*3/uL — ABNORMAL LOW (ref 150–400)
RBC: 3.23 MIL/uL — AB (ref 3.87–5.11)
RDW: 14.2 % (ref 11.5–15.5)
WBC: 6.6 10*3/uL (ref 4.0–10.5)

## 2014-11-26 SURGERY — LAPAROSCOPY, DIAGNOSTIC
Anesthesia: General

## 2014-11-26 MED ORDER — LACTATED RINGERS IV SOLN
INTRAVENOUS | Status: DC
  Administered 2014-11-26: 10:00:00 via INTRAVENOUS

## 2014-11-26 MED ORDER — POLYETHYLENE GLYCOL 3350 17 G PO PACK
17.0000 g | PACK | Freq: Every day | ORAL | Status: DC
  Administered 2014-11-26: 17 g via ORAL
  Filled 2014-11-26: qty 1

## 2014-11-26 MED ORDER — 0.9 % SODIUM CHLORIDE (POUR BTL) OPTIME
TOPICAL | Status: DC | PRN
  Administered 2014-11-26: 1000 mL

## 2014-11-26 MED ORDER — MIDAZOLAM HCL 2 MG/2ML IJ SOLN
INTRAMUSCULAR | Status: AC
  Filled 2014-11-26: qty 2

## 2014-11-26 MED ORDER — FENTANYL CITRATE (PF) 100 MCG/2ML IJ SOLN: 25.0000 ug | INTRAMUSCULAR | Status: DC | PRN

## 2014-11-26 MED ORDER — KETOROLAC TROMETHAMINE 30 MG/ML IJ SOLN
30.0000 mg | Freq: Once | INTRAMUSCULAR | Status: DC
  Filled 2014-11-26: qty 1

## 2014-11-26 MED ORDER — NEOSTIGMINE METHYLSULFATE 10 MG/10ML IV SOLN
INTRAVENOUS | Status: DC | PRN
  Administered 2014-11-26: 1.5 mg via INTRAVENOUS

## 2014-11-26 MED ORDER — PANTOPRAZOLE SODIUM 40 MG PO TBEC: 40.0000 mg | DELAYED_RELEASE_TABLET | Freq: Every day | ORAL | Status: DC

## 2014-11-26 MED ORDER — ONDANSETRON HCL 4 MG/2ML IJ SOLN
4.0000 mg | Freq: Four times a day (QID) | INTRAMUSCULAR | Status: DC | PRN
Start: 2014-11-26 — End: 2014-11-27

## 2014-11-26 MED ORDER — KETOROLAC TROMETHAMINE 30 MG/ML IJ SOLN: 30.0000 mg | Freq: Four times a day (QID) | INTRAMUSCULAR | Status: DC

## 2014-11-26 MED ORDER — CEFAZOLIN SODIUM-DEXTROSE 2-3 GM-% IV SOLR
INTRAVENOUS | Status: DC | PRN
  Administered 2014-11-26: 2 g via INTRAVENOUS

## 2014-11-26 MED ORDER — FENTANYL CITRATE (PF) 250 MCG/5ML IJ SOLN
INTRAMUSCULAR | Status: DC | PRN
  Administered 2014-11-26: 25 ug via INTRAVENOUS
  Administered 2014-11-26: 50 ug via INTRAVENOUS
  Administered 2014-11-26: 25 ug via INTRAVENOUS
  Administered 2014-11-26: 50 ug via INTRAVENOUS
  Administered 2014-11-26: 75 ug via INTRAVENOUS

## 2014-11-26 MED ORDER — ONDANSETRON HCL 4 MG PO TABS: 4.0000 mg | ORAL_TABLET | Freq: Four times a day (QID) | ORAL | Status: DC | PRN

## 2014-11-26 MED ORDER — PROPOFOL 10 MG/ML IV BOLUS
INTRAVENOUS | Status: DC | PRN
  Administered 2014-11-26: 120 mg via INTRAVENOUS

## 2014-11-26 MED ORDER — BISACODYL 10 MG RE SUPP: 10.0000 mg | Freq: Every day | RECTAL | Status: DC | PRN

## 2014-11-26 MED ORDER — IBUPROFEN 600 MG PO TABS: 600.0000 mg | ORAL_TABLET | Freq: Four times a day (QID) | ORAL | Status: DC | PRN

## 2014-11-26 MED ORDER — SCOPOLAMINE 1 MG/3DAYS TD PT72
1.0000 | MEDICATED_PATCH | Freq: Once | TRANSDERMAL | Status: DC
  Administered 2014-11-26: 1.5 mg via TRANSDERMAL

## 2014-11-26 MED ORDER — ONDANSETRON HCL 4 MG/2ML IJ SOLN
4.0000 mg | Freq: Once | INTRAMUSCULAR | Status: DC | PRN
Start: 2014-11-26 — End: 2014-11-26

## 2014-11-26 MED ORDER — CEFAZOLIN SODIUM-DEXTROSE 2-3 GM-% IV SOLR
INTRAVENOUS | Status: AC
  Filled 2014-11-26: qty 50

## 2014-11-26 MED ORDER — SCOPOLAMINE 1 MG/3DAYS TD PT72: 1.0000 | MEDICATED_PATCH | Freq: Once | TRANSDERMAL | Status: DC

## 2014-11-26 MED ORDER — LIDOCAINE HCL 1 % IJ SOLN
INTRAMUSCULAR | Status: DC | PRN
  Administered 2014-11-26: 30 mg via INTRADERMAL

## 2014-11-26 MED ORDER — HYDROMORPHONE HCL 1 MG/ML IJ SOLN
1.0000 mg | INTRAMUSCULAR | Status: DC | PRN
  Administered 2014-11-26 – 2014-11-27 (×6): 2 mg via INTRAVENOUS
  Filled 2014-11-26 (×6): qty 2

## 2014-11-26 MED ORDER — SODIUM CHLORIDE 0.9 % IR SOLN
Status: DC | PRN
  Administered 2014-11-26: 3000 mL

## 2014-11-26 MED ORDER — ROCURONIUM BROMIDE 100 MG/10ML IV SOLN
INTRAVENOUS | Status: DC | PRN
  Administered 2014-11-26: 5 mg via INTRAVENOUS
  Administered 2014-11-26: 15 mg via INTRAVENOUS

## 2014-11-26 MED ORDER — BUPIVACAINE HCL (PF) 0.5 % IJ SOLN
INTRAMUSCULAR | Status: DC | PRN
  Administered 2014-11-26: 10 mL

## 2014-11-26 MED ORDER — SUCCINYLCHOLINE CHLORIDE 20 MG/ML IJ SOLN
INTRAMUSCULAR | Status: DC | PRN
  Administered 2014-11-26: 80 mg via INTRAVENOUS

## 2014-11-26 MED ORDER — KETOROLAC TROMETHAMINE 30 MG/ML IJ SOLN
30.0000 mg | Freq: Four times a day (QID) | INTRAMUSCULAR | Status: DC
  Administered 2014-11-26 (×3): 30 mg via INTRAVENOUS
  Filled 2014-11-26 (×3): qty 1

## 2014-11-26 MED ORDER — SODIUM CHLORIDE 0.9 % IV SOLN
INTRAVENOUS | Status: DC
  Administered 2014-11-26: 18:00:00 via INTRAVENOUS

## 2014-11-26 MED ORDER — BUPIVACAINE HCL (PF) 0.5 % IJ SOLN
INTRAMUSCULAR | Status: AC
  Filled 2014-11-26: qty 30

## 2014-11-26 MED ORDER — SCOPOLAMINE 1 MG/3DAYS TD PT72
MEDICATED_PATCH | TRANSDERMAL | Status: AC
  Filled 2014-11-26: qty 1

## 2014-11-26 MED ORDER — GLYCOPYRROLATE 0.2 MG/ML IJ SOLN
INTRAMUSCULAR | Status: DC | PRN
  Administered 2014-11-26: 0.1 mg via INTRAVENOUS
  Administered 2014-11-26: .2 mg via INTRAVENOUS

## 2014-11-26 MED ORDER — OXYCODONE-ACETAMINOPHEN 5-325 MG PO TABS
1.0000 | ORAL_TABLET | ORAL | Status: DC | PRN
  Administered 2014-11-26: 1 via ORAL
  Administered 2014-11-26: 2 via ORAL
  Administered 2014-11-27: 1 via ORAL

## 2014-11-26 MED ORDER — MIDAZOLAM HCL 2 MG/2ML IJ SOLN
1.0000 mg | INTRAMUSCULAR | Status: DC | PRN
  Administered 2014-11-26: 2 mg via INTRAVENOUS

## 2014-11-26 SURGICAL SUPPLY — 49 items
BAG HAMPER (MISCELLANEOUS) ×3 IMPLANT
BANDAGE STRIP 1X3 FLEXIBLE (GAUZE/BANDAGES/DRESSINGS) IMPLANT
BANDAID FLEXIBLE 1X3 (GAUZE/BANDAGES/DRESSINGS) ×9 IMPLANT
BLADE SURG SZ11 CARB STEEL (BLADE) ×3 IMPLANT
CLOSURE WOUND 1/2 X4 (GAUZE/BANDAGES/DRESSINGS) ×1
CLOSURE WOUND 1/4 X3 (GAUZE/BANDAGES/DRESSINGS)
CLOTH BEACON ORANGE TIMEOUT ST (SAFETY) ×3 IMPLANT
COVER LIGHT HANDLE STERIS (MISCELLANEOUS) ×6 IMPLANT
DURAPREP 26ML APPLICATOR (WOUND CARE) ×3 IMPLANT
ELECT REM PT RETURN 9FT ADLT (ELECTROSURGICAL) ×3
ELECTRODE REM PT RTRN 9FT ADLT (ELECTROSURGICAL) ×1 IMPLANT
GLOVE BIO SURGEON STRL SZ 6.5 (GLOVE) ×2 IMPLANT
GLOVE BIO SURGEONS STRL SZ 6.5 (GLOVE) ×1
GLOVE BIOGEL PI IND STRL 7.0 (GLOVE) ×2 IMPLANT
GLOVE BIOGEL PI IND STRL 7.5 (GLOVE) ×1 IMPLANT
GLOVE BIOGEL PI IND STRL 9 (GLOVE) ×1 IMPLANT
GLOVE BIOGEL PI INDICATOR 7.0 (GLOVE) ×4
GLOVE BIOGEL PI INDICATOR 7.5 (GLOVE) ×2
GLOVE BIOGEL PI INDICATOR 9 (GLOVE) ×2
GLOVE ECLIPSE 9.0 STRL (GLOVE) ×6 IMPLANT
GOWN SPEC L3 XXLG W/TWL (GOWN DISPOSABLE) ×6 IMPLANT
GOWN STRL REUS W/TWL LRG LVL3 (GOWN DISPOSABLE) ×3 IMPLANT
INST SET LAPROSCOPIC GYN AP (KITS) ×3 IMPLANT
KIT ROOM TURNOVER AP CYSTO (KITS) ×3 IMPLANT
MANIFOLD NEPTUNE II (INSTRUMENTS) ×3 IMPLANT
NEEDLE HYPO 18GX1.5 BLUNT FILL (NEEDLE) IMPLANT
NEEDLE HYPO 25X1 1.5 SAFETY (NEEDLE) ×3 IMPLANT
NEEDLE INSUFFLATION 14GA 120MM (NEEDLE) ×3 IMPLANT
PACK PERI GYN (CUSTOM PROCEDURE TRAY) ×3 IMPLANT
PAD ARMBOARD 7.5X6 YLW CONV (MISCELLANEOUS) ×3 IMPLANT
SCALPEL HARMONIC ACE (MISCELLANEOUS) IMPLANT
SET BASIN LINEN APH (SET/KITS/TRAYS/PACK) ×3 IMPLANT
SET TUBE IRRIG SUCTION NO TIP (IRRIGATION / IRRIGATOR) ×3 IMPLANT
SLEEVE ENDOPATH XCEL 5M (ENDOMECHANICALS) ×3 IMPLANT
SOLUTION ANTI FOG 6CC (MISCELLANEOUS) ×3 IMPLANT
STRIP CLOSURE SKIN 1/2X4 (GAUZE/BANDAGES/DRESSINGS) ×2 IMPLANT
STRIP CLOSURE SKIN 1/4X3 (GAUZE/BANDAGES/DRESSINGS) IMPLANT
SUT VIC AB 4-0 PS2 27 (SUTURE) ×3 IMPLANT
SUT VICRYL 0 UR6 27IN ABS (SUTURE) ×3 IMPLANT
SUT VICRYL AB 3-0 FS1 BRD 27IN (SUTURE) IMPLANT
SYR BULB IRRIGATION 50ML (SYRINGE) ×3 IMPLANT
SYR CONTROL 10ML LL (SYRINGE) ×3 IMPLANT
SYRINGE 10CC LL (SYRINGE) ×6 IMPLANT
TRAY FOLEY CATH SILVER 16FR (SET/KITS/TRAYS/PACK) ×3 IMPLANT
TROCAR ENDO BLADELESS 11MM (ENDOMECHANICALS) ×3 IMPLANT
TROCAR XCEL NON-BLD 5MMX100MML (ENDOMECHANICALS) ×3 IMPLANT
TUBING DYE INJECTION 900-617 (MISCELLANEOUS) IMPLANT
TUBING INSUFFLATION (TUBING) ×3 IMPLANT
WARMER LAPAROSCOPE (MISCELLANEOUS) ×3 IMPLANT

## 2014-11-26 NOTE — Progress Notes (Signed)
*   No surgery found *hospital day 3 status post readmission for postoperative pain after laparoscopically assisted vaginal hysterectomy  Subjective: Patient reports that she still uncomfortable she rates the pain as a 7 but she sitting up on the side of the bed she is passing gas and able to void but has the area of hyperesthesia to the right of the umbilicus. The abdomen is soft. She is having no bleeding.. She feels like the swelling and discomfort in her low back has improved slightly she's received 1 unit packed cells overnight    Objective: I have reviewed patient's vital signs, medications and radiology results. We've gone over the CT scan which shows both ureters intact and normal diameter with good ureteral jets seen on CT. There is a small 4 cm hematoma just to the right of the colon  General: alert, cooperative and mild distress Resp: clear to auscultation bilaterally GI: soft, non-tender; bowel sounds normal; no masses,  no organomegaly and incision: clean, dry and intact Vaginal Bleeding: none Bimanual exam is performed. The vaginal cuff is of normal intact position there is no distortion by the hematoma exam is moderately tender and is felt to cause anterior pain. The rectal vault is full Assessment: s/p * No surgery found *: Hospital day 3 status post readmission for postoperative pain Pelvic hematoma, small  Plan: Discussed treatment options extensively with the patient. we've discussed proceeding with laparoscopy to try to evacuate the hematoma which I suspect is retroperitoneal. Given that there is no evidence of an acute abdomen, ureters are intact, gases being passed, voiding is happening, I recommended that we not proceed with surgery and manage her with analgesics. I'm going to give her Dulcolax suppository this a.m. and reassess to decide how long inpatient status should continue this time the patient is leaning toward avoiding surgery     FERGUSON,JOHN V 11/26/2014, 7:29  AM

## 2014-11-26 NOTE — Addendum Note (Signed)
Addendum  created 11/26/14 1307 by Moshe Salisbury, CRNA   Modules edited: Charges VN

## 2014-11-26 NOTE — Addendum Note (Signed)
Addendum  created 11/26/14 1224 by Moshe Salisbury, CRNA   Modules edited: Anesthesia Attestations, Anesthesia Flowsheet, Anesthesia Responsible Staff

## 2014-11-26 NOTE — Brief Op Note (Addendum)
11/24/2014 - 11/26/2014  11:50 AM  PATIENT:  Rachel Griffith  29 y.o. female  PRE-OPERATIVE DIAGNOSIS:  intolerable postop pelvic pain due to pelvic hematoma  POST-OPERATIVE DIAGNOSIS:  normal post op status post LAVH  PROCEDURE:  Procedure(s): DIAGNOSTIC LAPAROSCOPY (N/A)  SURGEON:  Surgeon(s) and Role:    * [name], MD - Primary  PHYSICIAN ASSISTANT:   ASSISTANTS: Caroline, PA-s   ANESTHESIA:   local and general  EBL:  Total I/O In: 900 [I.V.:900] Out: 200 [Urine:200]  BLOOD ADMINISTERED:none  DRAINS: none   LOCAL MEDICATIONS USED:  MARCAINE     SPECIMEN:  No Specimen  DISPOSITION OF SPECIMEN:  N/A  COUNTS:  YES  TOURNIQUET:  * No tourniquets in log *  DICTATION: .Dragon Dictation  PLAN OF CARE: Patient has an patient order already  PATIENT DISPOSITION:  PACU - hemodynamically stable.   Delay start of Pharmacological VTE agent (>24hrs) due to surgical blood loss or risk of bleeding: not applicable Details of procedure: Patient was taken operating room prepped and draped for abdominal procedure eggs in the low lithotomy support. Timeout was conducted. Ancef was administered. The infraumbilical previous one centimeters skin incision was reopened and direct insertion technique used with the laparoscope and a blunt-tipped trocar and the 10 mm camera was inserted through the umbilicus and pneumoperitoneum easily achieved photo documentation shows essentially normal bowel and minimal fluid in the and lower abdomen. Trendelenburg position allowed inspection the pelvis. There was a small fluid pocket to the right sigmoid colon this was free fluid in the pelvis. This was suctioned out and photos documented essentially normal pelvis for postoperative state. The right ovary could be elevated and inspected as normal, the retroperitoneum on the right did not show any evidence of hematoma. Vaginal cuff could be manipulated with a sponge stick gently and showed a intact  cuff no retroperitoneal hematoma. The left adnexa was inspected and couple of thin filmy adhesions from the left epiploic fat of the sigmoid colon to the left adnexa were broken free easily, the left ovary appeared normal. The procedure was considered successfully completed and then there was no need for operative interventions other than diagnosis of reassuring pelvic findings Pelvis was irrigated, with 200 cc of saline left in the abdomen to assist with evacuation of the pneumoperitoneum, the laparoscopic instruments removed, the suprapubic and umbilical sites only with the once a were used. Attention was then directed to the closure of the incisions. The fascia was closed at the umbilicus with 0 Vicryl and Marcaine used to inject around the incision. The suprapubic and umbilical sites were closed with subcuticular 4-0 Vicryl and patient allowed to go recovery in stable condition sponge and needle counts were correct

## 2014-11-26 NOTE — Progress Notes (Addendum)
this note summarizes patient evaluation performed last evening, 9/29  patient is status post readmission for pelvic pain after laparoscopically assisted vaginal hysterectomy today she's rated her pain at as high as a 7. No vomiting she is able to void no bowel movement  Subjective: Patient reports incisional pain.  She notes some increased pain on the right side just below the umbilicus is described as hyperesthetic  Objective: I have reviewed patient's vital signs, intake and output, medications, labs and radiology results. CT of the abdomen was completed late this afternoon and shows a small fluid collection consistent with a 4.5 cm x 4 cm hematoma this is been discussed with the patient phoned. She doesn't feel that she can tolerate the pain. We will place her nothing by mouth, transfuse 1 unit and consider laparoscopy. Full exam in a.m. General: moderate distress GI: normal findings: bowel sounds normal, abnormal findings:  Lower abdominal tenderness slight bloating and incision: clean, dry and intact  Assessment: s/p * No surgery found *: Postoperative pain status post laparoscopic assisted vaginal hysterectomy, small pelvic hematoma, mild postsurgical anemia plan  Plan: NPO Transfuse one unit packed cells to a hemoglobin above 10, exam and final decision in a.m. regarding laparoscopy     FERGUSON,JOHN V 11/26/2014, 7:24 AM

## 2014-11-26 NOTE — Progress Notes (Signed)
S/p LAVH with postop pelvic hematoma on rt, with pt unable to tolerate pain. Some peritoneal stretch considered likely   Subjective: Patient reports incisional pain.    Objective: I have reviewed patient's vital signs, medications and labs.  After extensive counselling of risks, benefits, pt does not feel she can tolerate the pain for continued observation, :will proceed with laparoscopy and evacuation of hematoma. Limitations of surgery, possible outcomes reviewed. To OR this am for Diagnostic Laparoscopy, with evacuation of pelvic hematoma.  Assessment: s/p LAVH with postop pelvic hematoma.   Plan: NPO to OR this a.m.   Cbc pending  FERGUSON,JOHN V 11/26/2014, 8:41 AM

## 2014-11-26 NOTE — Anesthesia Procedure Notes (Signed)
Procedure Name: Intubation Date/Time: 11/26/2014 10:51 AM Performed by: Glynn Octave E Pre-anesthesia Checklist: Patient identified, Patient being monitored, Timeout performed, Emergency Drugs available and Suction available Patient Re-evaluated:Patient Re-evaluated prior to inductionOxygen Delivery Method: Circle System Utilized Preoxygenation: Pre-oxygenation with 100% oxygen Intubation Type: IV induction Ventilation: Mask ventilation without difficulty Laryngoscope Size: Mac and 3 Grade View: Grade I Tube type: Oral Tube size: 7.0 mm Number of attempts: 1 Airway Equipment and Method: Stylet Placement Confirmation: ETT inserted through vocal cords under direct vision,  positive ETCO2 and breath sounds checked- equal and bilateral Secured at: 21 cm Tube secured with: Tape Dental Injury: Teeth and Oropharynx as per pre-operative assessment

## 2014-11-26 NOTE — Care Management Note (Signed)
Case Management Note  Patient Details  Name: ASSATA JUNCAJ MRN: [id number] Date of Birth: Jul 24, 1985  Subjective/Objective:                    Action/Plan:   Expected Discharge Date:  11/24/14               Expected Discharge Plan:  Home/Self Care  In-House Referral:  NA  Discharge planning Services  CM Consult  Post Acute Care Choice:  NA Choice offered to:  NA  DME Arranged:    DME Agency:     HH Arranged:    HH Agency:     Status of Service:  Completed, signed off  Medicare Important Message Given:    Date Medicare IM Given:    Medicare IM give by:    Date Additional Medicare IM Given:    Additional Medicare Important Message give by:     If discussed at Long Length of Stay Meetings, dates discussed:    Additional Comments: Pt taken back to surgery today. Anticipate discharge within 24-48 hours if pain and bleeding controlled. No CM needs noted. Arlyss Queen [place], RN 11/26/2014, 1:27 PM

## 2014-11-26 NOTE — Transfer of Care (Signed)
Immediate Anesthesia Transfer of Care Note  Patient: Rachel Griffith  Procedure(s) Performed: Procedure(s): DIAGNOSTIC LAPAROSCOPY (N/A)  Patient Location: PACU  Anesthesia Type:General  Level of Consciousness: awake, alert  and oriented  Airway & Oxygen Therapy: Patient Spontanous Breathing and Patient connected to face mask oxygen  Post-op Assessment: Report given to RN and Post -op Vital signs reviewed and stable  Post vital signs: Reviewed and stable  Last Vitals:  Filed Vitals:   11/26/14 1145  BP: 102/62  Pulse: 67  Temp: 36.6 C  Resp: 14    Complications: No apparent anesthesia complications

## 2014-11-26 NOTE — Anesthesia Postprocedure Evaluation (Signed)
  Anesthesia Post-op Note  Patient: Rachel Griffith  Procedure(s) Performed: Procedure(s): DIAGNOSTIC LAPAROSCOPY (N/A)  Patient Location: PACU  Anesthesia Type:General  Level of Consciousness: awake, alert  and oriented  Airway and Oxygen Therapy: Patient Spontanous Breathing  Post-op Pain: none  Post-op Assessment: Post-op Vital signs reviewed, Patient's Cardiovascular Status Stable, Respiratory Function Stable, Patent Airway and No signs of Nausea or vomiting              Post-op Vital Signs: Reviewed and stable  Last Vitals:  Filed Vitals:   11/26/14 1215  BP:   Pulse: 63  Temp:   Resp: 11    Complications: No apparent anesthesia complications

## 2014-11-26 NOTE — Op Note (Signed)
Please look at the brief operative note for surgical details

## 2014-11-26 NOTE — Progress Notes (Signed)
Pt tolerating water, Mt Dew (brought in by family) and graham crackers.  Will place regular diet order and continue to monitor.

## 2014-11-26 NOTE — Anesthesia Preprocedure Evaluation (Addendum)
Anesthesia Evaluation  Patient identified by MRN, date of birth, ID band Patient awake    Reviewed: Allergy & Precautions, NPO status , Patient's Chart, lab work & pertinent test results  History of Anesthesia Complications (+) PONV  Airway Mallampati: I  TM Distance: >3 FB Neck ROM: Full    Dental  (+) Teeth Intact, Dental Advisory Given   Pulmonary Current Smoker,    breath sounds clear to auscultation       Cardiovascular negative cardio ROS Normal cardiovascular exam     Neuro/Psych PSYCHIATRIC DISORDERS Anxiety negative neurological ROS     GI/Hepatic negative GI ROS, Neg liver ROS,   Endo/Other  negative endocrine ROS  Renal/GU negative Renal ROS  negative genitourinary   Musculoskeletal negative musculoskeletal ROS (+)   Abdominal Normal abdominal exam  (+)   Peds  Hematology negative hematology ROS (+)   Anesthesia Other Findings   Reproductive/Obstetrics                           Anesthesia Physical Anesthesia Plan  ASA: II  Anesthesia Plan: General   Post-op Pain Management:    Induction: Intravenous  Airway Management Planned: Oral ETT  Additional Equipment:   Intra-op Plan:   Post-operative Plan: Extubation in OR  Informed Consent: I have reviewed the patients History and Physical, chart, labs and discussed the procedure including the risks, benefits and alternatives for the proposed anesthesia with the patient or authorized representative who has indicated his/her understanding and acceptance.   Dental advisory given  Plan Discussed with: CRNA  Anesthesia Plan Comments:        Anesthesia Quick Evaluation

## 2014-11-27 ENCOUNTER — Emergency Department (HOSPITAL_COMMUNITY)
Admission: EM | Admit: 2014-11-27 | Discharge: 2014-11-27 | Disposition: A | Discharge status: Home / Self Care | Payer: Managed Care, Other (non HMO) | Attending: Emergency Medicine | Admitting: Emergency Medicine

## 2014-11-27 ENCOUNTER — Encounter (HOSPITAL_COMMUNITY): Payer: Self-pay | Admitting: *Deleted

## 2014-11-27 DIAGNOSIS — M7989 Other specified soft tissue disorders: Secondary | ICD-10-CM | POA: Diagnosis present

## 2014-11-27 DIAGNOSIS — Z79899 Other long term (current) drug therapy: Secondary | ICD-10-CM | POA: Insufficient documentation

## 2014-11-27 DIAGNOSIS — G8929 Other chronic pain: Secondary | ICD-10-CM | POA: Insufficient documentation

## 2014-11-27 DIAGNOSIS — Z720 Tobacco use: Secondary | ICD-10-CM | POA: Insufficient documentation

## 2014-11-27 DIAGNOSIS — R600 Localized edema: Secondary | ICD-10-CM | POA: Diagnosis not present

## 2014-11-27 DIAGNOSIS — F419 Anxiety disorder, unspecified: Secondary | ICD-10-CM | POA: Insufficient documentation

## 2014-11-27 DIAGNOSIS — Z8742 Personal history of other diseases of the female genital tract: Secondary | ICD-10-CM | POA: Diagnosis not present

## 2014-11-27 DIAGNOSIS — Z87828 Personal history of other (healed) physical injury and trauma: Secondary | ICD-10-CM | POA: Diagnosis not present

## 2014-11-27 MED ORDER — METHOCARBAMOL 500 MG PO TABS: 500.0000 mg | ORAL_TABLET | Freq: Four times a day (QID) | ORAL | Status: DC

## 2014-11-27 MED ORDER — KETOROLAC TROMETHAMINE 10 MG PO TABS: 10.0000 mg | ORAL_TABLET | Freq: Four times a day (QID) | ORAL | Status: DC | PRN

## 2014-11-27 NOTE — Progress Notes (Signed)
Pt IV removed, tolerated well.  Reviewed discharge instructions with pt and answered questions at this time.   

## 2014-11-27 NOTE — Discharge Instructions (Signed)

## 2014-11-27 NOTE — Discharge Instructions (Signed)
Call dr. Emelda Fear if problems.  Keep legs elevated.

## 2014-11-27 NOTE — ED Notes (Signed)
Pt had hysterectomy on Tuesday had a hematoma that was removed. Pt was d/c from AP 300 this morning. When pt got home she noticed her leg swelling and was told by after hours that she should come back in for evaluation. NAD noted. No breathing difficulties.

## 2014-11-27 NOTE — ED Provider Notes (Addendum)
CSN: [id number]     Arrival date & time 11/27/14  9562 History  By signing my name below, I, Rachel Griffith, attest that this documentation has been prepared under the direction and in the presence of Bethann Berkshire, MD. Electronically Signed: Lyndel Griffith, ED Scribe. 11/27/2014. 11:05 AM.    Chief Complaint  Patient presents with  . Leg Swelling    Patient is a 29 y.o. female presenting with leg pain. The history is provided by the patient (Patient recently had a hysterectomy this week no complaint of swelling to her legs.). No language interpreter was used.  Leg Pain Lower extremity pain location: The swelling is distal kidneys bilaterally. Pain details:    Quality:  Aching   Radiates to:  Does not radiate   Severity:  Mild   Onset quality:  Gradual   Timing:  Constant Chronicity:  New Associated symptoms: no back pain and no fatigue    HPI Comments: Rachel Griffith is a 29 y.o. female, with a PMhx of endometriosis, ovarian cyst, and chronic pelvic pain, who presents to the Emergency Department complaining of sudden onset, constant BLE edema. She also describes the feeling of tightness in her BLE that started in her toes this morning immediately after discharge and has since radiated up bilateral legs. She was prompted to the ED today after speaking with a nurse from Dr. Rayna Sexton office who advised the pt to be evaluated at the ED.  The pt had a hysterectomy 4 days ago by Dr. Emelda Fear at [facility] and was discharged 3 days ago. She presented to the ED 3 days ago after discharge c/o an acute worsening of pain secondary to the surgery. Dr. Emelda Fear then performed a diagnostic laparoscopic procedure. The pt was discharged this morning from [facility] after being evaluated by Dr. Emelda Fear.   Past Medical History  Diagnosis Date  . Endometriosis   . Ovarian cyst   . Chronic pelvic pain in female   . Back injury   . PTSD (post-traumatic stress disorder)   . Pelvic pain in female 10/06/2014  .  Endometriosis determined by laparoscopy 10/06/2014  . PONV (postoperative nausea and vomiting)     pt states that with 1st arthroscopy she "passed out and couldnt left her head" but they tell me it was a combination of the pain meds and anesthesia" No problems with next surgery. Not sure of meds given in anesthesia.  Marland Kitchen Anxiety    Past Surgical History  Procedure Laterality Date  . Knee surgery Bilateral     x2  . Endometrosis  2014    fulgeration-[facility]  . Abdominal hysterectomy    . Laparoscopy N/A 11/26/2014    Procedure: DIAGNOSTIC LAPAROSCOPY;  Surgeon: [name], MD;  Location: [facility];  Service: Gynecology;  Laterality: N/A;   Family History  Problem Relation Age of Onset  . Cancer Father     prostate  . Other Sister     IBS  . Other Brother     Lyme Disease  . Dementia Maternal Grandmother   . Parkinson's disease Maternal Grandfather   . Thrombocytopenia Sister    Social History  Substance Use Topics  . Smoking status: Current Some Day Smoker -- 0.25 packs/day for 5 years    Types: Cigarettes  . Smokeless tobacco: Never Used     Comment: smokes 1-2 cig daily  . Alcohol Use: Yes     Comment: occ   OB History    Gravida Para Term Preterm AB TAB  SAB Ectopic Multiple Living   Review of Systems  Constitutional: Negative for appetite change and fatigue.  HENT: Negative for congestion, ear discharge and sinus pressure.   Eyes: Negative for discharge.  Respiratory: Negative for cough.   Cardiovascular: Positive for leg swelling. Negative for chest pain.  Gastrointestinal: Negative for abdominal pain and diarrhea.  Genitourinary: Negative for frequency and hematuria.  Musculoskeletal: Negative for back pain.  Skin: Negative for rash.  Neurological: Negative for seizures and headaches.  Psychiatric/Behavioral: Negative for hallucinations.   Allergies  Augmentin and Sulfa antibiotics  Home Medications   Prior to Admission medications    Medication Sig Start Date End Date Taking? Authorizing Provider  ALPRAZolam Prudy Feeler) 0.25 MG tablet Take 0.25 mg by mouth daily as needed for anxiety.     Historical Provider, MD  gabapentin (NEURONTIN) 300 MG capsule Take 300 mg by mouth 3 (three) times daily.    Historical Provider, MD  ketorolac (TORADOL) 10 MG tablet Take 1 tablet (10 mg total) by mouth every 6 (six) hours as needed (five day limit postop). 11/27/14   [name], MD  methocarbamol (ROBAXIN) 500 MG tablet Take 1 tablet (500 mg total) by mouth 4 (four) times daily. 11/27/14   [name], MD  oxyCODONE-acetaminophen (PERCOCET/ROXICET) 5-325 MG tablet Take 1-2 tablets by mouth every 4 (four) hours as needed for severe pain (moderate to severe pain (when tolerating fluids)). 11/24/14   [name], MD  polyethylene glycol powder (MIRALAX) powder Take 17 g by mouth daily. To prevent constipation 11/24/14   [name], MD   BP 120/60 mmHg  Pulse 74  Temp(Src) 98.5 F (36.9 C) (Oral)  Resp 16  Ht  (1.753 m)  Wt 152 lb (68.947 kg)  BMI 22.44 kg/m2  SpO2 99%  LMP 09/19/2014 Physical Exam  Constitutional: She is oriented to person, place, and time. She appears well-developed.  HENT:  Head: Normocephalic.  Eyes: Conjunctivae and EOM are normal. No scleral icterus.  Neck: Neck supple. No tracheal deviation present. No thyromegaly present.  Cardiovascular: Normal rate and regular rhythm.  Exam reveals no gallop and no friction rub.   No murmur heard. Pulmonary/Chest: No stridor. She has no wheezes. She has no rales. She exhibits no tenderness.  Abdominal: She exhibits no distension. There is no tenderness. There is no rebound.  Musculoskeletal: Normal range of motion. She exhibits edema.  1+ edema BLE.   Lymphadenopathy:    She has no cervical adenopathy.  Neurological: She is oriented to person, place, and time. She exhibits normal muscle tone. Coordination normal.  Skin: Skin is warm. No rash noted. No  erythema.  Psychiatric: She has a normal mood and affect. Her behavior is normal.    ED Course  Procedures  DIAGNOSTIC STUDIES: Oxygen Saturation is 99% on RA, normal by my interpretation.    COORDINATION OF CARE: 11:12 AM Discussed treatment plan with pt at bedside and pt agreed to plan.    Imaging Review Ct Abdomen Pelvis W Contrast  11/25/2014   CLINICAL DATA:  Recent vaginal hysterectomy.  Worsening pelvic pain  EXAM: CT ABDOMEN AND PELVIS WITH CONTRAST  TECHNIQUE: Multidetector CT imaging of the abdomen and pelvis was performed using the standard protocol following bolus administration of intravenous contrast.  CONTRAST:  50mL OMNIPAQUE IOHEXOL 300 MG/ML SOLN, OMNIPAQUE IOHEXOL 300 MG/ML SOLN  COMPARISON:  None.  FINDINGS: Lower chest: No  pleural or pericardial effusion. The lung bases are clear.  Hepatobiliary: There is no focal liver abnormality. The gallbladder is normal. No biliary dilatation. Mild periportal edema noted.  Pancreas: Normal appearance of the pancreas.  Spleen: Negative  Adrenals/Urinary Tract: The adrenal glands are normal. Normal appearance of both kidneys. Urinary bladder appears normal for degree of distention.  Stomach/Bowel: The stomach is within normal limits. The small bowel loops have a normal course and caliber. No obstruction. Normal appearance of the colon.  Vascular/Lymphatic: Normal appearance of the abdominal aorta. No enlarged retroperitoneal or mesenteric adenopathy. No enlarged pelvic or inguinal lymph nodes.  Reproductive: Postoperative changes compatible with hysterectomy. There is moderate complex fluid within the dependent portion of the pelvis which measures 4.6 x 4.5 x 5.3 cm and measures 26 Hounsfield units.  Other: Free intraperitoneal air is identified. Nonspecific in the early postoperative time frame.  Musculoskeletal: No worrisome bone abnormalities. No aggressive lytic or sclerotic bone lesions.  IMPRESSION: 1. Postoperative changes  compatible with recent hysterectomy. 2. There is nonspecific, complex fluid within the dependent portion of the pelvis. This could represent postoperative hematoma, seroma, or phlegmon formation. At this time there is no well-formed drainable abscess. If patient's symptoms persist or worsen then consider repeat imaging with pelvic CT.   Electronically Signed   By: Signa Kell M.D.   On: 11/25/2014 15:26   I have personally reviewed and evaluated these images as part of my medical decision-making.   MDM   Final diagnoses:  None  The chart was scribed for me under my direct supervision.  I personally performed the history, physical, and medical decision making and all procedures in the evaluation of this patient..   Edema. I spoke with Dr. Emelda Fear. He does not want to put patient on diuretic she is to keep her legs elevated and follow-up with him on October 4  Bethann Berkshire, MD 11/27/14 1130  Bethann Berkshire, MD 11/27/14 1130

## 2014-11-27 NOTE — Discharge Summary (Signed)
Physician Discharge Summary  Patient ID: Rachel Griffith MRN: [id number] DOB/AGE: June 12, 1985 29 y.o.  Admit date: 11/24/2014 Discharge date: 11/27/2014  Admission Diagnoses:  Discharge Diagnoses:  Active Problems:   Pelvic pain in female   Status post laparoscopic assisted vaginal hysterectomy (LAVH)   Postoperative abdominal pain   Discharged Condition: good  Hospital Course: pt eventually had laparoscopy on 9/30 to rule out retroperitoneal hematoma. Pelvic findings were very normal.  Expand All Collapse All   Rachel Griffith is an 29 y.o. female. She is placed in overnight observation for pain control. She is now status post laparoscopically assisted vaginal hysterectomy that was notable for slightly greater than average blood loss, but was considered straightforward beyond that. She had a great deal of pain immediately in recovery room but this subsided shortly after arrival on the floor. When seen this morning for routine postop care she was quite comfortable with her pain control which is being managed with the Dilaudid PCA, as well as Toradol. She had converted to Percocet orally and was felt stable for going home. This evening she awoke from a nap, and a lounge chair and found her pain is adequately controlled with the Percocet so she will return to the emergency room for further assessment. The laboratories suggest hemoglobin remained stable, and the patient has responded well to IV Dilaudid. The patient will be admitted overnight for pain management and reassessment of outpatient medications.    The patient had no confidence that she was stable ,and CT abdomen showed a large fluid collection that was thought to be a hematoma. Th patietn insisted that she was uncomfortable with simply observing as outp.  Consults: None  Significant Diagnostic Studies: labs:  CBC Latest Ref Rng 11/26/2014 11/25/2014 11/25/2014  WBC 4.0 - 10.5 K/uL 6.6 5.5 5.8  Hemoglobin 12.0 - 15.0 g/dL 10.5(L)  9.2(L) 8.8(L)  Hematocrit 36.0 - 46.0 % 30.7(L) 27.5(L) 26.1(L)  Platelets 150 - 400 K/uL 134(L) 135(L) 125(L)      Treatments: IV hydration and analgesia: Vicodin and Dilaudid then laparoscopy due to pain,and patient concern over how she was doing. Subjective: Patient reports that she still uncomfortable she rates the pain as a 7 but she sitting up on the side of the bed she is passing gas and able to void but has the area of hyperesthesia to the right of the umbilicus. The abdomen is soft. She is having no bleeding.. She feels like the swelling and discomfort in her low back has improved slightly she's received 1 unit packed cells overnight  Ct done to rule out hematoma  Discharge Exam: Blood pressure 103/66, pulse 76, temperature 98.9 F (37.2 C), temperature source Oral, resp. rate 18, height  (1.753 m), weight 152 lb (68.947 kg), last menstrual period 09/19/2014, SpO2 94 %. General appearance: alert, cooperative and mild distress Head: Normocephalic, without obvious abnormality, atraumatic Back: symmetric, no curvature. ROM normal. No CVA tenderness. GI: abnormal findings:  deep pelvic pain tolerated poorly Pelvic: cervix normal in appearance, external genitalia normal, no adnexal masses or tenderness, no cervical motion tenderness, rectovaginal septum normal, uterus normal size, shape, and consistency and vagina normal without discharge Extremities: extremities normal, atraumatic, no cyanosis or edema  Laparoscopy showed normal pelvis, and some watery pelvic fluid but  normal ureters. And bladder Disposition: 01-Home or Self Care  Discharge Instructions    Call MD for:  persistant nausea and vomiting    Complete by:  As directed      Call MD  for:  temperature >100.4    Complete by:  As directed      Diet - low sodium heart healthy    Complete by:  As directed      Increase activity slowly    Complete by:  As directed             Medication List    STOP taking these  medications        ibuprofen 600 MG tablet  Commonly known as:  ADVIL,MOTRIN      TAKE these medications        ALPRAZolam 0.25 MG tablet  Commonly known as:  XANAX  Take 0.25 mg by mouth daily as needed for anxiety.     gabapentin 300 MG capsule  Commonly known as:  NEURONTIN  Take 300 mg by mouth 3 (three) times daily.     ketorolac 10 MG tablet  Commonly known as:  TORADOL  Take 1 tablet (10 mg total) by mouth every 6 (six) hours as needed (five day limit postop).     methocarbamol 500 MG tablet  Commonly known as:  ROBAXIN  Take 1 tablet (500 mg total) by mouth 4 (four) times daily.     oxyCODONE-acetaminophen 5-325 MG tablet  Commonly known as:  PERCOCET/ROXICET  Take 1-2 tablets by mouth every 4 (four) hours as needed for severe pain (moderate to severe pain (when tolerating fluids)).     polyethylene glycol powder powder  Commonly known as:  MIRALAX  Take 17 g by mouth daily. To prevent constipation           Follow-up Information    Follow up with [name], MD In 4 days.   Specialties:  Obstetrics and Gynecology, Radiology   Why:  Postoperative visit   Contact information:   [address] [place] [address] [phone]       Signed: [name] 11/27/2014, 7:29 AM

## 2014-11-27 NOTE — ED Notes (Signed)
Pt had hysterectomy by Dr. Emelda Fear at [facility] on Tuesday 11/23/14 and discharged Wed 11/24/14. Pt was readmitted for pain control on Wed and was thought to have hematoma from surgery and pt was opened up again to evaluate. Pt was discharged this morning 11/27/2014 and after pt went home she realized her feet and legs began to swell and increasingly got worse. Pt called the after hours nurse for Dr. Emelda Fear and they told her to come to the ED for evaluation. Pt denies pain. Pt has non-pitting edema in BLE. NAD noted. Pt has 3 incision ports on lower abdomen with steri strips and band aids covering them, dressings clean, dry and intact. Pt does report trouble with urination over the last several days including pain with urination and trouble starting her flow. Denies any bowel problem, no BM since surgery but pt has been passing gas.

## 2014-11-28 LAB — TYPE AND SCREEN
ABO/RH(D): A NEG
ANTIBODY SCREEN: NEGATIVE
UNIT DIVISION: 0
Unit division: 0

## 2014-11-30 ENCOUNTER — Encounter: Payer: Self-pay | Admitting: Obstetrics and Gynecology

## 2014-11-30 ENCOUNTER — Ambulatory Visit (INDEPENDENT_AMBULATORY_CARE_PROVIDER_SITE_OTHER): Payer: Managed Care, Other (non HMO) | Admitting: Obstetrics and Gynecology

## 2014-11-30 VITALS — BP 100/68 | Ht 69.0 in | Wt 148.0 lb

## 2014-11-30 DIAGNOSIS — D6489 Other specified anemias: Secondary | ICD-10-CM

## 2014-11-30 DIAGNOSIS — Z9889 Other specified postprocedural states: Secondary | ICD-10-CM

## 2014-11-30 NOTE — Progress Notes (Signed)
Patient ID: Rachel Griffith, female   DOB: 08-19-1985, 29 y.o.   MRN: [id number] Pt here today for post op visit. Pt denies any problems or concerns at this time.

## 2014-11-30 NOTE — Progress Notes (Signed)
Patient ID: MAKENLEIGH CROWNOVER, female   DOB: Jan 03, 1986, 29 y.o.   MRN: [id number]  D&E (Dilation and Evacuation) Dilation and evacuation (D&E) is a minor operation. It involves stretching (dilation) the cervix and evacuation of the uterus. During the procedure, the cervix is dilated and tissue is gently suctioned from the inside of the uterus.  REASONS FOR DOING D&E  Removal of retained placenta after giving birth.   Abortion.  Miscarriage.  RISKS AND COMPLICATIONS  Putting a hole (perforation) in the uterus.  Excessive bleeding after the D&E.   Infection of the uterus.   Damage to the cervix.   Developing scar tissue (adhesions) inside the uterus, later causing abnormal bleeding or no monthly bleeding (amenorrhea) or problems with fertility.  Complications from general or local anesthetic.     PROCEDURE  You may be given a drug to make you sleep (general anesthetic) or a drug that numbs the area (local anesthetic) in and around the cervix.   You will lie on your back with your legs in stirrups.   A curved tool (suction curette) will be used to evacuate the uterus and will then be removed.  This usually takes around 15 to 30 minutes.  AFTER THE PROCEDURE  You will rest in the recovery room until you are stable and feel ready to go home.   You may feel sick to your stomach (nauseous) or throw up (vomit) if you had general anesthesia.   You may have light cramping and bleeding for a couple days to 2 weeks after the procedure.   Your uterus needs to make new lining after a D&E. This may make your next period late.   HOME CARE INSTRUCTIONS  Do not drive for 24 hours.   Wait 1 week before returning to strenuous activities.   You may resume your usual diet.   Drink enough water and fluids to keep your urine clear or pale yellow.   You should return to your usual bowel function. If constipation occurs, you may:   Take a mild laxative with permission from your  caregiver.   Add fruit and bran to your diet.   Take showers instead of baths for two weeks  Do not go swimming or use a hot tub until your caregiver gives you permission.   Have someone with you or available for you the first 24 to 48 hours, especially if you had a general anesthetic.   Do not douche, use tampons, or have intercourse until after your follow-up appointment, or when your caregiver approves.   Only take over-the-counter or prescription medicines for pain, discomfort, or fever as directed by your caregiver. Do not take aspirin. It can cause bleeding.   If a prescription has been given to you, follow your caregiver's directions. You may be given a medicine that kills germs (antibiotic) to prevent an infection.   Keep all your follow-up appointments recommended by your caregiver.   SEEK MEDICAL CARE IF:  You have increasing cramps or pain not relieved with medicine.   You develop belly (abdominal) pain, which does not seem to be related to the same area as your earlier cramping and pain.   You feel dizzy or feel like fainting.   You have a bad smelling vaginal discharge.   You develop a rash.   You develop a reaction or allergy to your medicine.   SEEK IMMEDIATE MEDICAL CARE IF:  Bleeding is heavier than a normal menstrual period.   You have an oral  temperature above 101F, not controlled by medicine.   You develop chest pain.   You develop shortness of breath.   You pass out.   You develop heavy vaginal bleeding with or without blood clots.   MAKE SURE YOU:  Understand these instructions.   Will watch your condition.   Will get help right away if you are not doing well or get worse.   UPDATED HEALTH PRACTICES  A Pap smear is done to screen for cervical cancer.   The first Pap smear should be done at age 50.   Between ages 61 and 76, Pap smears are repeated every 2 years.   Beginning at age 89, you are advised to have a Pap smear every 3 years  as long as your past 3 Pap smears have been normal.   Some women have medical problems that increase the chance of getting cervical cancer. Talk to your caregiver about these problems. It is especially important to talk to your caregiver if a new problem develops soon after your last Pap smear. In these cases, your caregiver may recommend more frequent screening and Pap smears.   The above recommendations are the same for women who have or have not gotten the vaccine for HPV (human papillomavirus).   If you had a uterus removal (hysterectomy) for a problem that was not a cancer or a condition that could lead to cancer, then you no longer need Pap smears.   If you are between ages 54 and 7, and you have had normal Pap smears going back 10 years, you no longer need Pap smears.   If you have had past treatment for cervical cancer or a condition that could lead to cancer, you need Pap smears and screening for cancer for at least 20 years after your treatment.   Continue monthly breast self-examinations. Your caregiver can provide information and instructions for breast self-examination.  ExitCare Patient Information 2011 [place], [place].    Subjective:  GLENIS MUSOLF is a 29 y.o. female now 1 weeks status post laparoscopic assisted vaginal hysterectomy. That was notable for pt perceiving exaggerated pain and readmitted for pain control POD 1, and CT showed a fluid collection in the pelvis, so combined with her pain complaints, repeat laparoscopy done on POD 3 revealing normal postop findings. Pt was d/c'd home on POD 4, and within 4 hrs was returning to the ED due to concerns over ankle swelling. The pt weighed 138 at the time of preop eval, and weighed 152 clothed on ED visit on 10/1 , pod 4.  She was not satisfied with her overall wellbeing, and went to ED in [facility], where she had another CT , and was observed overnite, and given diuretics. She reports weight loss of several pounds. A  review of records identify that she now has a weight clothed as 148, compared to 152 on the day pod 4 , . She denies significant pain, "every day doing significantly better."  She desires to RTW on 14 October, so she will need to be seen again in a week.    Review of Systems Negative except weight still up a bit.   Diet:   reg   Bowel movements : normal.  Pain is controlled with current analgesics. Medications being used: narcotic analgesics including oxycodone/acetaminophen (Percocet, Tylox).  Objective:  BP 100/68 mmHg  Ht  (1.753 m)  Wt 148 lb (67.132 kg)  BMI 21.85 kg/m2  LMP 09/19/2014 General:Well developed, well nourished.  No  acute distress. Abdomen: Bowel sounds normal, soft, non-tender.  Incision(s):   Healing well   , no erythema, no hernia, no swelling, no dehiscence, abdomen soft. Legs minimal swelling.     Assessment:  Post-Op 1 weeks s/p laparoscopic assisted vaginal hysterectomy    Doing well postoperatively.   Plan:  1.Wound care discussed  And will see pt 1 wk for gyn exam to see if she can RTW on 14 oct as requested. 2. . current medications.miralax, colace, 3. Activity restrictions: no lifting more than 20 pounds 4. return to work: 1-2 weeks. 5. Follow up in 1 week.

## 2014-12-04 NOTE — Discharge Summary (Signed)
Physician Discharge Summary  Patient ID: Rachel Griffith MRN: [id number] DOB/AGE: [birth year] 30 y.o.  Admit date: 11/23/2014 Discharge date: 11/24/2014 Admission Diagnoses: Pelvic pain and endometriosis uterine retroversion  Discharge Diagnoses: Pelvic pain uterine retroversion endometriosis Active Problems:   Status post laparoscopic assisted vaginal hysterectomy (LAVH)   Discharged Condition: good  Hospital Course: This 29 year old female gravida 2 para 2 was admitted for laparoscopically assisted vaginal hysterectomy for chronic pelvic pain. She was originally worked up in [facility] [facility],, came to my office with requests to proceed with surgery locally. She has laparoscopically proven grade 1 endometriosis and chronic pelvic pain she underwent laparoscopically assisted vaginal hysterectomy and bilateral salpingectomy with ovarian preservation the vaginal hysterectomy portion the case was slightly bloody of the suspected but the outcome was good with good urinary output and no concern for adjacent organ involvement. Postoperatively the patient's pain levels were dramatically significant and required significantly greater than usual pain medications during first few hours after surgery. She was initially quite agitated postop, compound down by the following morning and the following morning appeared to be more normal, with vaginal packing removed a ability to void was discharged home that time.  Consults: None  Significant Diagnostic Studies: labs: Hemoglobin 11.9 preprocedure, BUN 13 creatinine 0.7 Postoperatively hemoglobin was 9.8 white count 11,300. Patient appeared to be stable enough for discharge home for routine postsurgical care  Treatments: surgery: Left scopic assisted vaginal hysterectomy  Discharge Exam: Blood pressure 93/54, pulse 79, temperature 98.3 F (36.8 C), temperature source Oral, resp. rate 18, height  (1.753 m), weight 146 lb (66.225 kg), SpO2 100 %. General  appearance: alert, cooperative and mild distress Head: Normocephalic, without obvious abnormality, atraumatic GI: soft, non-tender; bowel sounds normal; no masses,  no organomegaly Pelvic: Vaginal packing removed and patient able to void. Normal external genitalia  Disposition: 01-Home or Self Care  Discharge Instructions    Call MD for:  persistant nausea and vomiting    Complete by:  As directed      Call MD for:  severe uncontrolled pain    Complete by:  As directed      Call MD for:  temperature >100.4    Complete by:  As directed      Diet - low sodium heart healthy    Complete by:  As directed      Increase activity slowly    Complete by:  As directed             Medication List    STOP taking these medications        HYDROcodone-acetaminophen 5-325 MG tablet  Commonly known as:  NORCO/VICODIN     ibuprofen 200 MG tablet  Commonly known as:  ADVIL,MOTRIN     levonorgestrel 20 MCG/24HR IUD  Commonly known as:  MIRENA     megestrol 40 MG tablet  Commonly known as:  MEGACE      TAKE these medications        ALPRAZolam 0.25 MG tablet  Commonly known as:  XANAX  Take 0.25 mg by mouth daily as needed for anxiety.     gabapentin 300 MG capsule  Commonly known as:  NEURONTIN  Take 300 mg by mouth 3 (three) times daily.     polyethylene glycol powder powder  Commonly known as:  MIRALAX  Take 17 g by mouth daily. To prevent constipation           Follow-up Information    Follow up with [name], MD  On 11/30/2014.   Specialties:  Obstetrics and Gynecology, Radiology   Why:  at 1:45 pm   Contact information:   [address] [place] [address] [phone]       Signed: [name] 12/16/2014, 8:46 AM

## 2014-12-08 ENCOUNTER — Ambulatory Visit (INDEPENDENT_AMBULATORY_CARE_PROVIDER_SITE_OTHER): Payer: Managed Care, Other (non HMO) | Admitting: Obstetrics and Gynecology

## 2014-12-08 ENCOUNTER — Encounter: Payer: Self-pay | Admitting: Obstetrics and Gynecology

## 2014-12-08 VITALS — BP 90/60 | Ht 69.0 in | Wt 144.5 lb

## 2014-12-08 DIAGNOSIS — Z09 Encounter for follow-up examination after completed treatment for conditions other than malignant neoplasm: Secondary | ICD-10-CM

## 2014-12-08 MED ORDER — METRONIDAZOLE 0.75 % VA GEL: 1.0000 | Freq: Every day | VAGINAL | Status: DC

## 2014-12-08 NOTE — Progress Notes (Signed)
Patient ID: Rachel PinaCourtney H Griffith, female   DOB: 10-29-1985, 29 y.o.   MRN: [id number]  Subjective:  Rachel Griffith is a 29 y.o. female now 3 weeks status post laparoscopic assisted vaginal hysterectomy with bilateral salpingectomy. She has no complaints or concerns. She states she is mostly pain free at this time and has relief from chronic her pelvic pain. She notes she feels better than before surgery  Review of Systems Negative    Diet:   normal   Bowel movements : normal.  Pain is controlled with current analgesics. Medications being used: ibuprofen (OTC).  Objective:  BP 90/60 mmHg  Ht 5\' 9"  (1.753 m)  Wt 144 lb 8 oz (65.545 kg)  BMI 21.33 kg/m2  LMP 09/19/2014 General:Well developed, well nourished.  No acute distress. Abdomen: Bowel sounds normal, soft, non-tender. Pelvic Exam:     External Genitalia:  Normal.    Vagina: Normal with good support. Cuff healing normally. Light discharge.    Cervix: surgically removed    Uterus: surgically removed    Adnexa/Bimanual: Normal  Incision(s):   Healing well, no drainage, no erythema, no hernia, no swelling, no dehiscence,     Assessment:  Post-Op 3 weeks s/p laparoscopic assisted vaginal hysterectomy and bilateral salpingectomy   Doing well postoperatively.   Plan:  1 . current medications- continue taking OTC Ibuprofen for pain mgmt 2. Activity restrictions: no strenuous activity for 6-8 weeks. 3. return to work: now. 4. Follow up in 6 weeks.  By signing my name below, I, Jarvis Morganaylor Shenea Giacobbe, attest that this documentation has been prepared under the direction and in the presence of Tilda BurrowJohn Aydian Dimmick V, MD. Electronically Signed: Jarvis Morganaylor Froilan Mclean, ED Scribe. 12/08/2014. 2:31 PM.  I personally performed the services described in this documentation, which was SCRIBED in my presence. The recorded information has been reviewed and considered accurate. It has been edited as necessary during review. Tilda BurrowFERGUSON,Endre Coutts V, MD

## 2014-12-08 NOTE — Progress Notes (Signed)
Patient ID: Rachel PinaCourtney H Kolar, female   DOB: 06/11/1985, 29 y.o.   MRN: [id number] Pt here today for post op visit. Pt denies any problems or concerns at this time. Pt still wants to go back to work on the 14th.

## 2014-12-29 ENCOUNTER — Ambulatory Visit: Payer: Managed Care, Other (non HMO) | Admitting: Obstetrics and Gynecology

## 2015-01-03 ENCOUNTER — Encounter: Payer: Self-pay | Admitting: Obstetrics and Gynecology

## 2015-01-03 ENCOUNTER — Ambulatory Visit: Payer: Managed Care, Other (non HMO) | Admitting: Obstetrics and Gynecology

## 2015-02-02 ENCOUNTER — Encounter: Payer: Self-pay | Admitting: Obstetrics and Gynecology

## 2015-02-02 ENCOUNTER — Ambulatory Visit (INDEPENDENT_AMBULATORY_CARE_PROVIDER_SITE_OTHER): Payer: Managed Care, Other (non HMO) | Admitting: Obstetrics and Gynecology

## 2015-02-02 VITALS — BP 100/60 | Ht 69.0 in | Wt 151.0 lb

## 2015-02-02 DIAGNOSIS — Z9889 Other specified postprocedural states: Secondary | ICD-10-CM

## 2015-02-02 NOTE — Progress Notes (Signed)
Patient ID: Rachel Griffith, female   DOB: Jul 25, 1985, 29 y.o.   MRN: [id number]  Subjective:  Rachel PinaCourtney H Joa is a 29 y.o. female now 9 weeks status post laparoscopic assisted vaginal hysterectomy. Patient reports she started to break out in acne, particularly on her back, several weeks after the surgery, which has gradually improved. This is a new problem.   She states she has been experiencing increased moodiness that feels similar to the moodiness she experienced with past menstrual cycles. She reports this is more challenging to deal with because she suffers from PTSD as well. She reports she has tried Lexapro in the past with unwanted side effects, including feeling "so lightheaded I couldn't move from one room to the other." However, she notes she will go to her PCP for discussion of SSRI's.   Patient states she is not currently sexually active.   Review of Systems Negative except as noted above   Diet:   normal   Bowel movements : normal.  The patient is not having any pain.  Objective:  BP 100/60 mmHg  Ht 5\' 9"  (1.753 m)  Wt 151 lb (68.493 kg)  BMI 22.29 kg/m2  LMP 09/19/2014 General:Well developed, well nourished.  No acute distress. Abdomen: Bowel sounds normal, soft, non-tender. Pelvic Exam:    External Genitalia:  Normal.     Vagina: Cuff of vagina has two small areas of granulation tissue, 3-5 mm in diameter    Cervix: Surgically removed    Uterus: Surgically removed    Adnexa/Bimanual: Ovaries appear normal.    Cuff: slight tenderness to palpation, may be some pelvic adhesions o u Two small areas of granulation tissue, 3-5 mm in diameter, removed from vaginal cuff. Stalk treated with silver nitrate.   Assessment:  Final Post-Op Visit. 9 weeks s/p laparoscopic assisted vaginal hysterectomy.   Granulation tissue, removed.  Subjective reports of increased moodiness in recent weeks. Doing well postoperatively.   Plan:  1.  Follow up prn - at least every 3 years  to check on vaginal support. 2. To PCP for discussion of SSRI's   By signing my name below, I, Ronney LionSuzanne Le, attest that this documentation has been prepared under the direction and in the presence of Tilda BurrowJohn V Emrik Erhard, MD. Electronically Signed: Ronney LionSuzanne Le, ED Scribe. 02/02/2015. 9:36 AM.   I personally performed the services described in this documentation, which was SCRIBED in my presence. The recorded information has been reviewed and considered accurate. It has been edited as necessary during review. Tilda BurrowFERGUSON,Zairah Arista V, MD

## 2015-02-02 NOTE — Progress Notes (Signed)
Patient ID: Rachel Griffith, female   DOB: 1985-12-26, 29 y.o.   MRN: [id number] Pt here today for follow up visit. Pt states that she missed her 6 week follow up. Pt states that she feels like she has some changes in her hormones. Pt states that she has had some break outs and she feels moody.

## 2016-02-08 ENCOUNTER — Encounter (INDEPENDENT_AMBULATORY_CARE_PROVIDER_SITE_OTHER): Payer: Self-pay

## 2016-02-08 ENCOUNTER — Ambulatory Visit (INDEPENDENT_AMBULATORY_CARE_PROVIDER_SITE_OTHER): Payer: Managed Care, Other (non HMO) | Admitting: Obstetrics and Gynecology

## 2016-02-08 ENCOUNTER — Encounter: Payer: Self-pay | Admitting: Obstetrics and Gynecology

## 2016-02-08 DIAGNOSIS — Z90710 Acquired absence of both cervix and uterus: Secondary | ICD-10-CM | POA: Diagnosis not present

## 2016-02-08 DIAGNOSIS — R102 Pelvic and perineal pain: Secondary | ICD-10-CM

## 2016-02-08 DIAGNOSIS — N83201 Unspecified ovarian cyst, right side: Secondary | ICD-10-CM | POA: Diagnosis not present

## 2016-02-08 DIAGNOSIS — N83202 Unspecified ovarian cyst, left side: Secondary | ICD-10-CM

## 2016-02-08 MED ORDER — ETONOGESTREL-ETHINYL ESTRADIOL 0.12-0.015 MG/24HR VA RING: VAGINAL_RING | VAGINAL | 12 refills | Status: DC

## 2016-02-08 NOTE — Progress Notes (Signed)
[facility] Visit  02/08/16            Patient name: Rachel Griffith Longest MRN [id number]  Date of birth: Oct 02, 1985  CC & HPI:  Rachel Griffith Casa is a 30 y.o. female presenting today for intermittent pelvic/lower abdominal pain. She has a history of endometriosis and is s/p hysterectomy. She retains both ovaries and this sometimes causes monthly hormonal symptoms. She reports history of ovarian cysts and believes this to be related. She states it alternates between left and right but sometimes is bilateral. She reports lower back pain that has been persistent since last night.Patient used NuvaRing in the past with good results she prefers to avoid having to take a pill, as she works multiple shifts  ROS:  ROS +pelvic pain +ovarian pain +current back pain Otherwise negative  Pertinent History Reviewed:   Reviewed: Significant for endometriosis, abdominal hysterectomy   Medical         Past Medical History:  Diagnosis Date  . Anxiety   . Back injury   . Chronic pelvic pain in female   . Endometriosis   . Endometriosis determined by laparoscopy 10/06/2014  . GERD (gastroesophageal reflux disease)   . Ovarian cyst   . Pelvic pain in female 10/06/2014  . PONV (postoperative nausea and vomiting)    pt states that with 1st arthroscopy she "passed out and couldnt left her head" but they tell me it was a combination of the pain meds and anesthesia" No problems with next surgery. Not sure of meds given in anesthesia.  Marland Kitchen. PTSD (post-traumatic stress disorder)                               Surgical Hx:    Past Surgical History:  Procedure Laterality Date  . ABDOMINAL HYSTERECTOMY    . endometrosis  2014   fulgeration-[facility]  . KNEE SURGERY Bilateral    x2  . LAPAROSCOPY N/A 11/26/2014   Procedure: DIAGNOSTIC LAPAROSCOPY;  Surgeon: Tilda BurrowJohn Paislie Tessler V, MD;  Location: [facility];  Service: Gynecology;  Laterality: N/A;   Medications: Reviewed & Updated - see associated section               Current Outpatient Prescriptions:  .  ALPRAZolam (XANAX) 0.25 MG tablet, Take 0.25 mg by mouth at bedtime as needed for anxiety., Disp: , Rfl:  .  buPROPion (WELLBUTRIN XL) 150 MG 24 hr tablet, Take 150 mg by mouth daily., Disp: , Rfl:  .  gabapentin (NEURONTIN) 300 MG capsule, Take 300 mg by mouth 3 (three) times daily., Disp: , Rfl:  .  ibuprofen (ADVIL,MOTRIN) 600 MG tablet, Take 600 mg by mouth every 6 (six) hours as needed., Disp: , Rfl:  .  pantoprazole (PROTONIX) 40 MG tablet, Take 40 mg by mouth daily., Disp: , Rfl:  .  ranitidine (ZANTAC) 300 MG tablet, Take 300 mg by mouth at bedtime., Disp: , Rfl:  .  polyethylene glycol powder (MIRALAX) powder, Take 17 g by mouth daily. To prevent constipation (Patient not taking: Reported on 02/08/2016), Disp: 255 g, Rfl: prn   Social History: Reviewed -  reports that she has quit smoking. Her smoking use included Cigarettes. She has a 1.25 pack-year smoking history. She has never used smokeless tobacco.  Objective Findings:  Vitals: Blood pressure (!) 84/60, pulse 61, weight 147 lb 9.6 oz (67 kg), last menstrual period 09/19/2014.  Physical Examination: Pelvic -  VULVA:  normal appearing vulva with no masses, tenderness or lesions,  VAGINA: normal appearing vagina with normal color and discharge, no lesions,  CERVIX: surgically absent,  UTERUS: surgically absent, vaginal cuff well healed,  ADNEXA: support better left than right. Left ovary palpable and reproduces pelvic pain. Right ovary not fully palpated but vague tenderness on right less than left.    Assessment & Plan:   A:  1. Hemorraghic ovarian cyst 2. History of generalized pelvic pain, and endometriosis 3 status post hysterectomy  P:  1. Suppress ovaries and cysts with Nuva-ring    By signing my name below, I, Sonum Patel, attest that this documentation has been prepared under the direction and in the presence of Tilda BurrowJohn V Ritta Hammes, MD. Electronically Signed: Sonum  Patel, Neurosurgeoncribe. 02/08/16. 10:52 AM.  I personally performed the services described in this documentation, which was SCRIBED in my presence. The recorded information has been reviewed and considered accurate. It has been edited as necessary during review. Tilda BurrowFERGUSON,Keyonta Madrid V, MD

## 2016-03-19 ENCOUNTER — Other Ambulatory Visit: Payer: Self-pay | Admitting: Obstetrics and Gynecology

## 2016-03-19 ENCOUNTER — Telehealth: Payer: Self-pay | Admitting: Obstetrics and Gynecology

## 2016-03-19 MED ORDER — NORGESTIMATE-ETH ESTRADIOL 0.25-35 MG-MCG PO TABS: 1.0000 | ORAL_TABLET | Freq: Every day | ORAL | 11 refills | Status: DC

## 2016-03-19 NOTE — Telephone Encounter (Signed)
Patient states she wants to switch to birth pills. The Nuva rings will not stay in when she runs. States you would switch her to pills without an appointment. Please advise.

## 2016-03-19 NOTE — Progress Notes (Signed)
Rx for Sprintec escribed

## 2016-03-21 MED ORDER — NORGESTIMATE-ETH ESTRADIOL 0.25-35 MG-MCG PO TABS: 1.0000 | ORAL_TABLET | Freq: Every day | ORAL | 11 refills | Status: DC

## 2017-08-30 ENCOUNTER — Other Ambulatory Visit: Payer: Self-pay

## 2017-08-30 ENCOUNTER — Emergency Department (HOSPITAL_COMMUNITY)
Admission: EM | Admit: 2017-08-30 | Discharge: 2017-08-30 | Disposition: A | Discharge status: Home / Self Care | Payer: Managed Care, Other (non HMO) | Attending: Emergency Medicine | Admitting: Emergency Medicine

## 2017-08-30 ENCOUNTER — Encounter (HOSPITAL_COMMUNITY): Payer: Self-pay

## 2017-08-30 DIAGNOSIS — F410 Panic disorder [episodic paroxysmal anxiety] without agoraphobia: Secondary | ICD-10-CM | POA: Insufficient documentation

## 2017-08-30 DIAGNOSIS — F17210 Nicotine dependence, cigarettes, uncomplicated: Secondary | ICD-10-CM | POA: Insufficient documentation

## 2017-08-30 DIAGNOSIS — Z79899 Other long term (current) drug therapy: Secondary | ICD-10-CM | POA: Insufficient documentation

## 2017-08-30 HISTORY — DX: Postural orthostatic tachycardia syndrome (POTS): G90.A

## 2017-08-30 HISTORY — DX: Orthostatic hypotension: I95.1

## 2017-08-30 HISTORY — DX: Other specified cardiac arrhythmias: I49.8

## 2017-08-30 HISTORY — DX: Tachycardia, unspecified: R00.0

## 2017-08-30 MED ORDER — ALPRAZOLAM 0.5 MG PO TABS
0.2500 mg | ORAL_TABLET | Freq: Once | ORAL | Status: AC
  Administered 2017-08-30: 0.25 mg via ORAL
  Filled 2017-08-30: qty 1

## 2017-08-30 MED ORDER — LORAZEPAM 1 MG PO TABS
1.0000 mg | ORAL_TABLET | Freq: Once | ORAL | Status: AC
  Administered 2017-08-30: 1 mg via ORAL
  Filled 2017-08-30: qty 1

## 2017-08-30 NOTE — ED Notes (Signed)
Upon entering room, Pt presents with tachypnea and crying. Pt states numbness in extremities. MD, Wickline notified.

## 2017-08-30 NOTE — ED Provider Notes (Signed)
[facility] EMERGENCY DEPARTMENT Provider Note   CSN: [id number] Arrival date & time: 08/30/17  4098     History   Chief Complaint Chief Complaint  Patient presents with  . Panic Attack    Pt seen with NP student, I performed history/physical/documentation HPI Rachel Griffith is a 32 y.o. female.  The history is provided by the patient.  Anxiety  This is a recurrent problem. The current episode started less than 1 hour ago. The problem occurs constantly. The problem has been rapidly worsening. Associated symptoms include shortness of breath. Associated symptoms comments: Chest heaviness. The symptoms are aggravated by stress. Nothing relieves the symptoms.   Patient presents for panic attack.  She reports just prior to arrival she began having numbness in her extremities, she felt short of breath, chest heaviness.  This is similar to prior panic attacks.  It usually resolves with Xanax, but she did not have any home at this time.  She usually gets panic attacks once a month. Past Medical History:  Diagnosis Date  . Anxiety   . Back injury   . Chronic pelvic pain in female   . Endometriosis   . Endometriosis determined by laparoscopy 10/06/2014  . GERD (gastroesophageal reflux disease)   . Ovarian cyst   . Pelvic pain in female 10/06/2014  . PONV (postoperative nausea and vomiting)    pt states that with 1st arthroscopy she "passed out and couldnt left her head" but they tell me it was a combination of the pain meds and anesthesia" No problems with next surgery. Not sure of meds given in anesthesia.  Marland Kitchen POTS (postural orthostatic tachycardia syndrome)    pt reports this was diagnosed a couple of months ago by PCP (Dr Annia Friendly)  . PTSD (post-traumatic stress disorder)     Patient Active Problem List   Diagnosis Date Noted  . Ovarian cyst, bilateral 02/08/2016  . Postoperative abdominal pain 11/24/2014  . Status post laparoscopic assisted vaginal hysterectomy (LAVH) 11/23/2014    . Pelvic pain in female 10/06/2014  . Endometriosis determined by laparoscopy 10/06/2014    Past Surgical History:  Procedure Laterality Date  . ABDOMINAL HYSTERECTOMY    . endometrosis  2014   fulgeration-[facility]  . KNEE SURGERY Bilateral    x2  . LAPAROSCOPY N/A 11/26/2014   Procedure: DIAGNOSTIC LAPAROSCOPY;  Surgeon: [name], MD;  Location: [facility];  Service: Gynecology;  Laterality: N/A;     OB History    Gravida  2   Para  2   Term  2   Preterm      AB      Living        SAB      TAB      Ectopic      Multiple      Live Births               Home Medications    Prior to Admission medications   Medication Sig Start Date End Date Taking? Authorizing Provider  amphetamine-dextroamphetamine (ADDERALL) 15 MG tablet Take by mouth.   Yes [provider]  buPROPion (WELLBUTRIN XL) 150 MG 24 hr tablet Take 150 mg by mouth daily.   Yes [provider]  gabapentin (NEURONTIN) 300 MG capsule Take 300 mg by mouth 3 (three) times daily.   Yes [provider]  levocetirizine (XYZAL) 5 MG tablet Take by mouth.   Yes [provider]  meloxicam (MOBIC) 15 MG tablet TK 1  T PO QD 06/26/17  Yes [provider]  norgestimate-ethinyl estradiol (ORTHO-CYCLEN,SPRINTEC,PREVIFEM) 0.25-35 MG-MCG tablet Take 1 tablet by mouth daily. 03/21/16  Yes [name], MD  norgestimate-ethinyl estradiol (ORTHO-CYCLEN,SPRINTEC,PREVIFEM) 0.25-35 MG-MCG tablet Take 1 tablet by mouth daily. 03/19/16  Yes [name], MD  orphenadrine (NORFLEX) 100 MG tablet Take by mouth. 05/20/17 05/20/18 Yes [provider]  ALPRAZolam Prudy Feeler) 0.25 MG tablet Take 0.25 mg by mouth at bedtime as needed for anxiety.    [provider]  ibuprofen (ADVIL,MOTRIN) 600 MG tablet Take 600 mg by mouth every 6 (six) hours as needed.    [provider]  pantoprazole (PROTONIX) 40 MG tablet Take 40 mg by mouth daily.    [provider]  polyethylene glycol powder (MIRALAX) powder Take 17 g by mouth daily. To prevent constipation Patient not taking: Reported on 02/08/2016 11/24/14   [name], MD  ranitidine (ZANTAC) 300 MG tablet Take 300 mg by mouth at bedtime.    [provider]    Family History Family History  Problem Relation Age of Onset  . Cancer Father        prostate  . Other Sister        IBS  . Other Brother        Lyme Disease  . Dementia Maternal Grandmother   . Parkinson's disease Maternal Grandfather   . Thrombocytopenia Sister     Social History Social History   Tobacco Use  . Smoking status: Current Every Day Smoker    Packs/day: 0.50    Types: Cigarettes  . Smokeless tobacco: Never Used  Substance Use Topics  . Alcohol use: Yes    Comment: occ  . Drug use: No     Allergies   Sulfa antibiotics; Trazodone and nefazodone; and Sudafed [pseudoephedrine hcl]   Review of Systems Review of Systems  Constitutional: Negative for fever.  Respiratory: Positive for shortness of breath.   Psychiatric/Behavioral: Negative for suicidal ideas. The patient is nervous/anxious.   All other systems reviewed and are negative.    Physical Exam Updated Vital Signs BP 127/87   Pulse (!) 56   Temp 98 F (36.7 C) (Oral)   Resp 16   Ht 1.753 m (5\' 9" )   Wt 68 kg (150 lb)   LMP 09/19/2014 Comment: post LAVH  SpO2 100%   BMI 22.15 kg/m   Physical Exam  CONSTITUTIONAL: Well developed/well nourished HEAD: Normocephalic/atraumatic EYES: EOMI/PERRL ENMT: Mucous membranes moist NECK: supple no meningeal signs SPINE/BACK:entire spine nontender CV: S1/S2 noted, no murmurs/rubs/gallops noted LUNGS: Lungs are clear to auscultation bilaterally, no apparent distress ABDOMEN: soft, nontender NEURO: Pt is awake/alert/appropriate, moves all extremitiesx4.  No facial droop.   EXTREMITIES: pulses normal/equal, full ROM SKIN: warm, color normal PSYCH: Anxious  ED  Treatments / Results  Labs (all labs ordered are listed, but only abnormal results are displayed) Labs Reviewed - No data to display  EKG EKG Interpretation  Date/Time:  Friday August 30 2017 03:34:40 EDT Ventricular Rate:  56 PR Interval:    QRS Duration: 95 QT Interval:  413 QTC Calculation: 399 R Axis:   62 Text Interpretation:  Sinus rhythm Ventricular premature complex Anteroseptal infarct, age indeterminate No significant change since last tracing Confirmed by Zadie Rhine ([id number]) on 08/30/2017 3:59:50 AM   Radiology No results found.  Procedures Procedures   Medications Ordered in ED Medications  LORazepam (ATIVAN) tablet 1 mg (1 mg Oral Given 08/30/17 0341)  ALPRAZolam Prudy Feeler) tablet  0.25 mg (0.25 mg Oral Given 08/30/17 0421)     Initial Impression / Assessment and Plan / ED Course  I have reviewed the triage vital signs and the nursing notes.     Patient presented for panic attack.  She has a long history of this, and this is similar to prior but she had no Xanax at home.  She is now improved and feels appropriate for discharge.  Final Clinical Impressions(s) / ED Diagnoses   Final diagnoses:  Panic attack    ED Discharge Orders    None       Zadie RhineWickline, Omarri Eich, MD 08/30/17 [phone]

## 2017-08-30 NOTE — ED Triage Notes (Signed)
Pt reports having "panic attack" tonight while sitting on couch. Pt reports having hx of panic attack and usually xanax will help resolve, but pt reports to be out of them, b/c she doesn't take them often and took the last one a couple of weeks ago. Pt also reports having palpations tonight. Denies CP.

## 2017-11-20 IMAGING — US US PELVIS COMPLETE TRANSABD/TRANSVAG W DUPLEX
1 series · 13 of 25 positions shown · non-contrast
Comparison: Abdominal CT [DATE].  Pelvic ultrasound [DATE]

CLINICAL DATA: Pelvic pain

EXAM:
TRANSABDOMINAL AND TRANSVAGINAL ULTRASOUND OF PELVIS
DOPPLER ULTRASOUND OF OVARIES
TECHNIQUE: Both transabdominal and transvaginal ultrasound examinations of the
pelvis were performed. Transabdominal technique was performed for
global imaging of the pelvis including uterus, ovaries, adnexal
regions, and pelvic cul-de-sac.
It was necessary to proceed with endovaginal exam following the
transabdominal exam to visualize the ovaries. Color and duplex
Doppler ultrasound was utilized to evaluate blood flow to the
ovaries.

[Series 1: us pelvis complete transabd/transvag w duplex · 0.20mm/px · 13 of 125 slices shown]
[im 1/125]
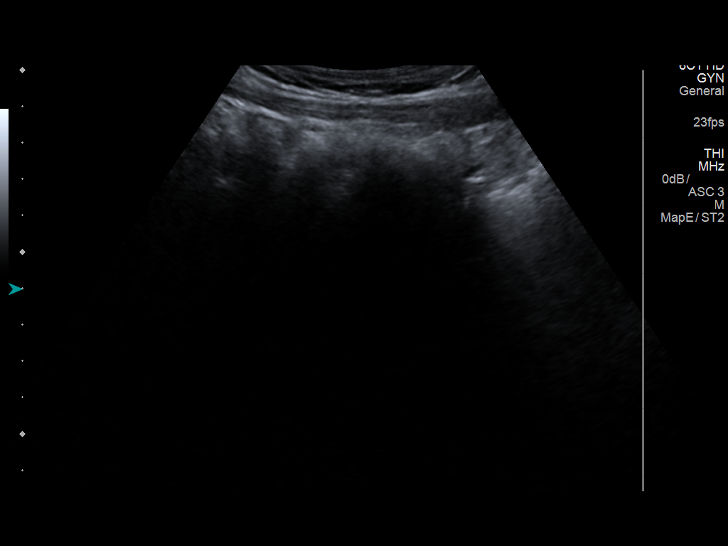
[im 11/125]
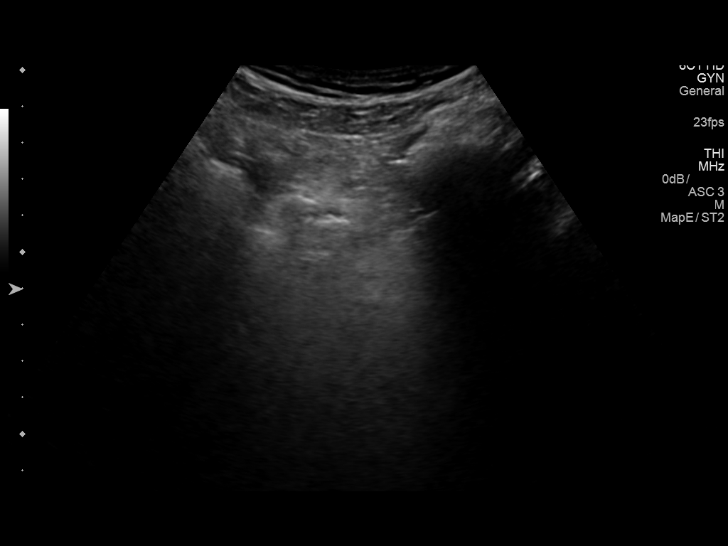
[im 21/125]
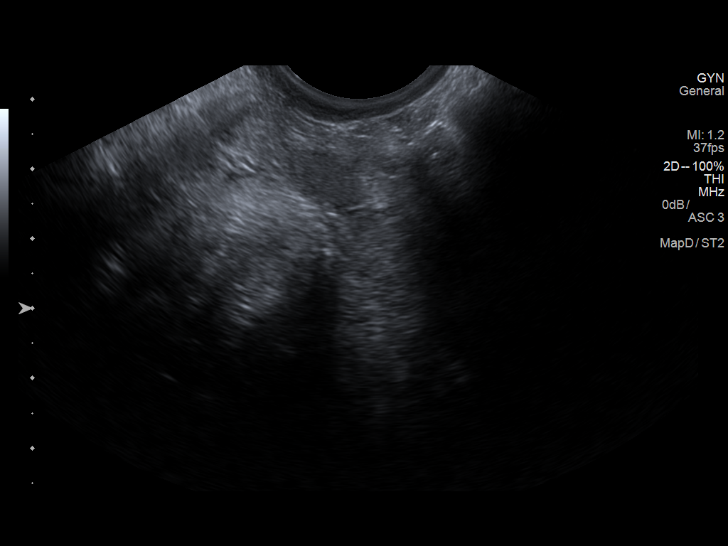
[im 32/125]
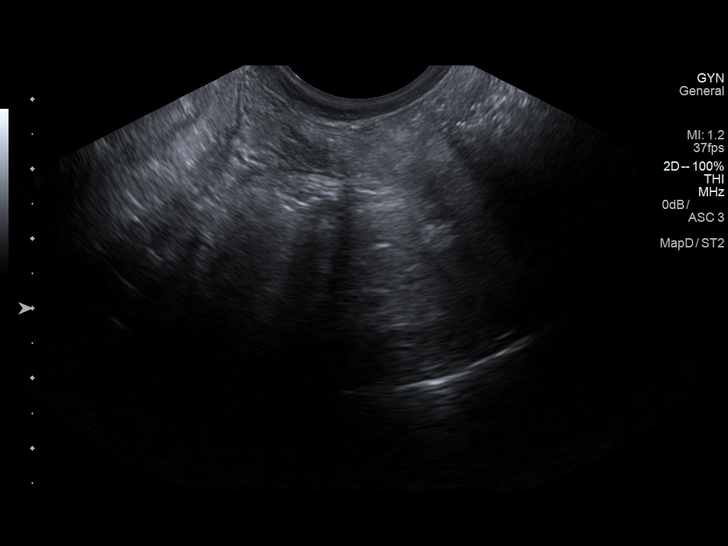
[im 42/125]
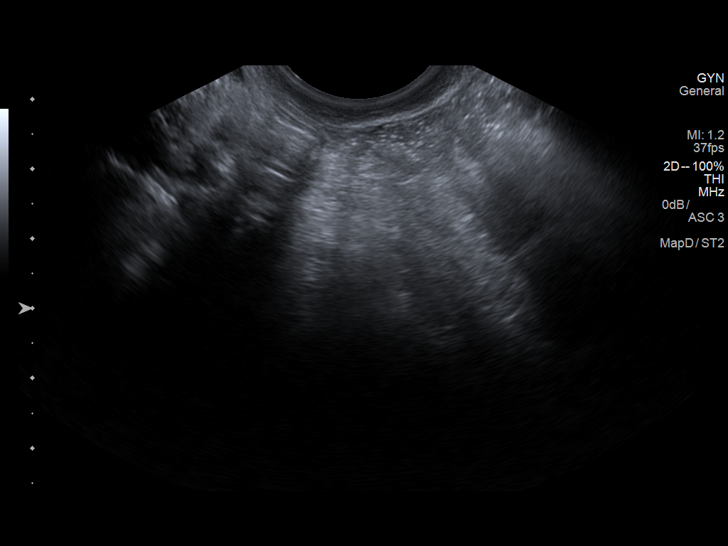
[im 52/125]
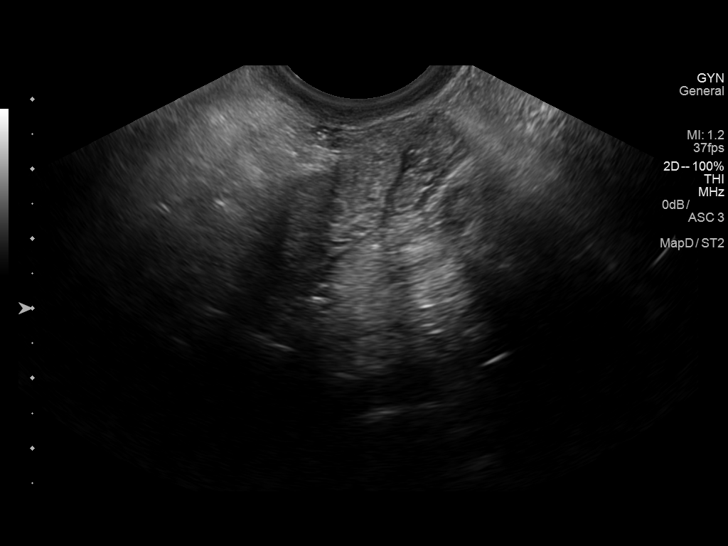
[im 63/125]
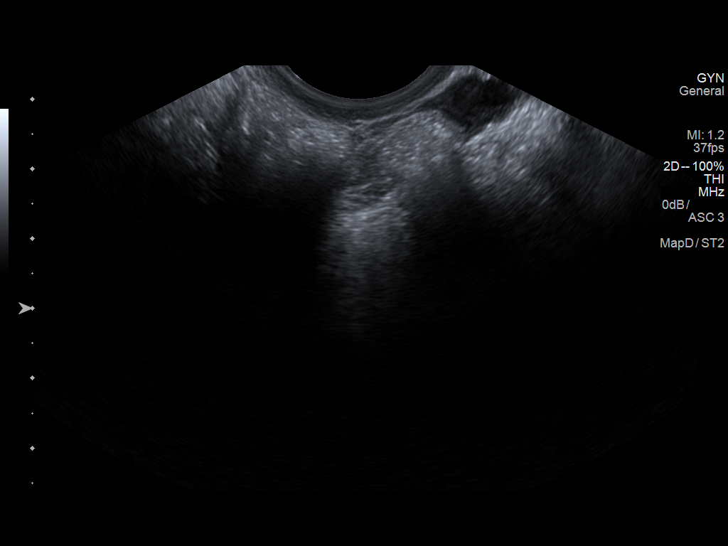
[im 73/125]
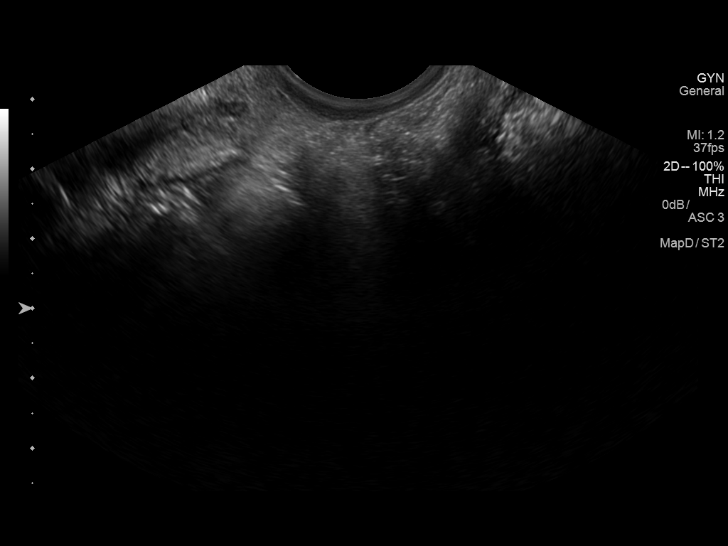
[im 83/125]
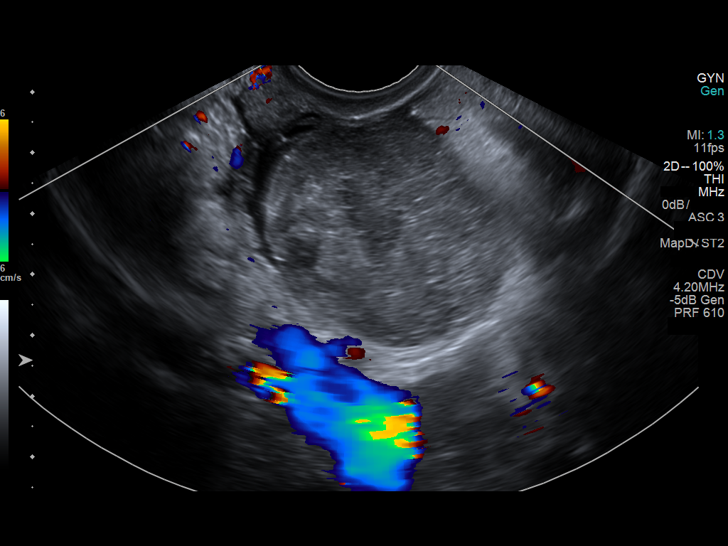
[im 94/125]
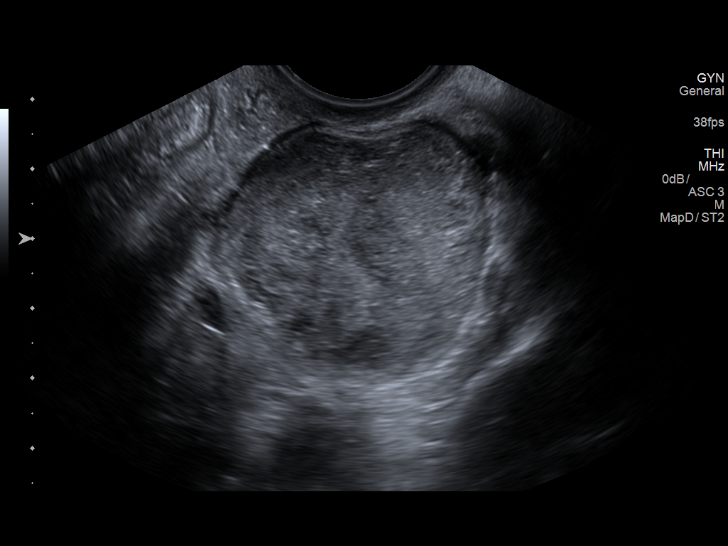
[im 104/125]
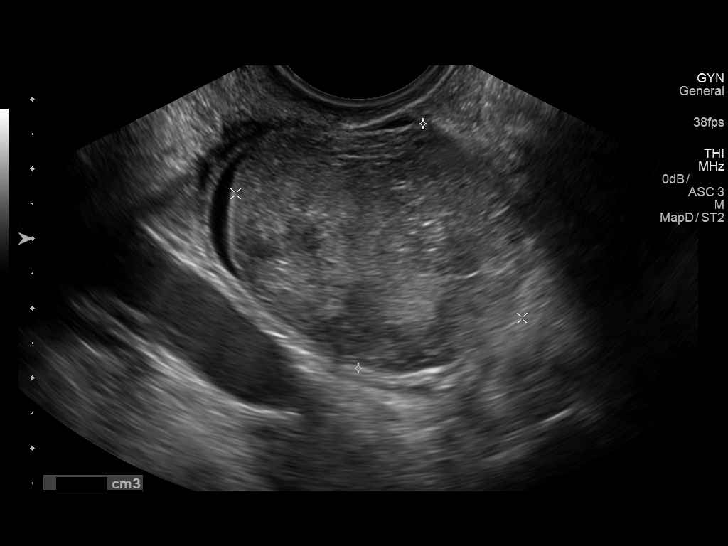
[im 114/125]
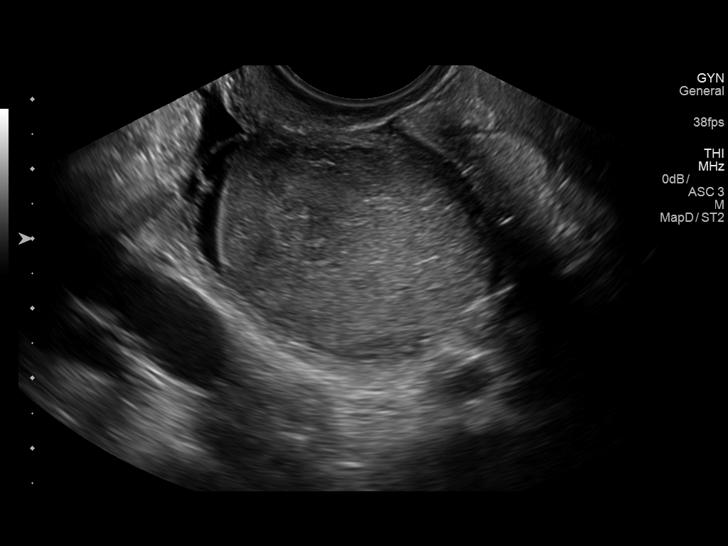
[im 125/125]
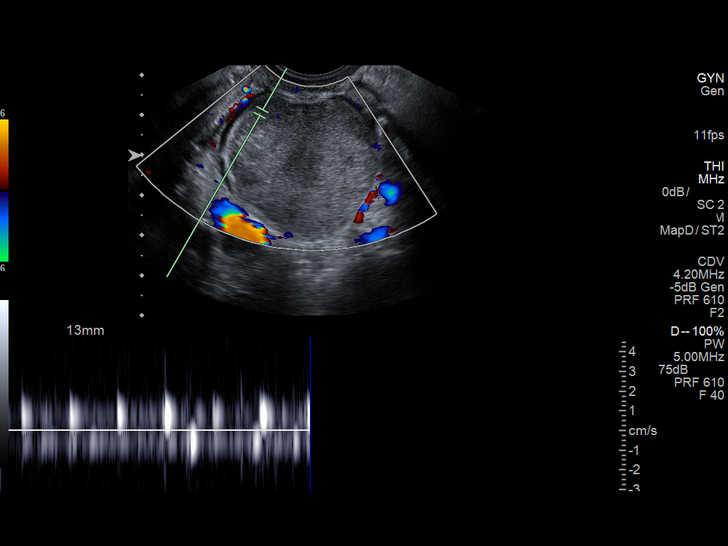

[13 of 25 positions shown; findings below may reference images not displayed]

FINDINGS: Uterus

Surgically absent

Right ovary

History of right oophorectomy.  No adnexal mass.

Left ovary

Measurements: 4.2 x 4.5 x 3.6 cm = volume: 36 mL. Most of the
ovarian volume is from an avascular mass with mid level echoes and a
few superimposed septations. The cyst was previously much more
hypoechoic. Complex cyst has enlarged from prior when it measured up
to 2.8 cm.

Arterial and venous waveforms are seen in the compressed left
ovarian parenchyma.

Other findings

No abnormal free fluid.
IMPRESSION: 1. 4.5 cm complex left ovarian cyst, most likely hemorrhagic cyst,
which has enlarged and become more complex since [DATE]
ultrasound.
2. Present left ovarian blood flow.

## 2017-11-30 ENCOUNTER — Encounter (HOSPITAL_COMMUNITY): Payer: Self-pay | Admitting: Emergency Medicine

## 2017-11-30 ENCOUNTER — Other Ambulatory Visit: Payer: Self-pay

## 2017-11-30 ENCOUNTER — Emergency Department (HOSPITAL_COMMUNITY)
Admission: EM | Admit: 2017-11-30 | Discharge: 2017-11-30 | Disposition: A | Discharge status: Home / Self Care | Payer: Self-pay | Attending: Emergency Medicine | Admitting: Emergency Medicine

## 2017-11-30 DIAGNOSIS — Z79899 Other long term (current) drug therapy: Secondary | ICD-10-CM | POA: Insufficient documentation

## 2017-11-30 DIAGNOSIS — E876 Hypokalemia: Secondary | ICD-10-CM | POA: Insufficient documentation

## 2017-11-30 DIAGNOSIS — R002 Palpitations: Secondary | ICD-10-CM | POA: Insufficient documentation

## 2017-11-30 DIAGNOSIS — F17210 Nicotine dependence, cigarettes, uncomplicated: Secondary | ICD-10-CM | POA: Insufficient documentation

## 2017-11-30 DIAGNOSIS — R102 Pelvic and perineal pain: Secondary | ICD-10-CM | POA: Insufficient documentation

## 2017-11-30 LAB — CBC
HEMATOCRIT: 36 % (ref 36.0–46.0)
HEMOGLOBIN: 12.7 g/dL (ref 12.0–15.0)
MCH: 32.2 pg (ref 26.0–34.0)
MCHC: 35.3 g/dL (ref 30.0–36.0)
MCV: 91.4 fL (ref 78.0–100.0)
Platelets: 217 10*3/uL (ref 150–400)
RBC: 3.94 MIL/uL (ref 3.87–5.11)
RDW: 11.5 % (ref 11.5–15.5)
WBC: 6.6 10*3/uL (ref 4.0–10.5)

## 2017-11-30 LAB — COMPREHENSIVE METABOLIC PANEL
ALK PHOS: 35 U/L — AB (ref 38–126)
ALT: 16 U/L (ref 0–44)
AST: 18 U/L (ref 15–41)
Albumin: 3.8 g/dL (ref 3.5–5.0)
Anion gap: 8 (ref 5–15)
BILIRUBIN TOTAL: 0.6 mg/dL (ref 0.3–1.2)
BUN: 9 mg/dL (ref 6–20)
CALCIUM: 8.7 mg/dL — AB (ref 8.9–10.3)
CO2: 25 mmol/L (ref 22–32)
CREATININE: 0.71 mg/dL (ref 0.44–1.00)
Chloride: 106 mmol/L (ref 98–111)
Glucose, Bld: 96 mg/dL (ref 70–99)
Potassium: 2.7 mmol/L — CL (ref 3.5–5.1)
Sodium: 139 mmol/L (ref 135–145)
Total Protein: 7 g/dL (ref 6.5–8.1)

## 2017-11-30 LAB — MAGNESIUM: MAGNESIUM: 2.1 mg/dL (ref 1.7–2.4)

## 2017-11-30 MED ORDER — POTASSIUM CHLORIDE CRYS ER 20 MEQ PO TBCR
40.0000 meq | EXTENDED_RELEASE_TABLET | Freq: Once | ORAL | Status: AC
  Administered 2017-11-30: 40 meq via ORAL
  Filled 2017-11-30: qty 2

## 2017-11-30 MED ORDER — POTASSIUM CHLORIDE CRYS ER 20 MEQ PO TBCR: EXTENDED_RELEASE_TABLET | ORAL | 0 refills | Status: DC

## 2017-11-30 NOTE — ED Triage Notes (Signed)
Patient c/o tachycardia. Per patient hx of POTS . Patient states heart rate was elevated to 145 on Wednesday and has been 105-115 over past 2 days. Per patient shortness of breath with elevation of heart rate. Patient also states that she has also had hypertension which is highly unusual for her but states she "drank a lot of gatorade over past couple of days just in case her heart rate was related to dehydration."

## 2017-11-30 NOTE — ED Triage Notes (Signed)
Pt arrives from home c/o hypokalemia. [facility] arrived to find Pt on floor in doorway. Pt experiencing near syncope and tachycardia. Pt hx includes POTS. Pt here earlier today for same complaint and did not get prescription filled.

## 2017-11-30 NOTE — ED Provider Notes (Signed)
[facility] EMERGENCY DEPARTMENT Provider Note   CSN: [id number] Arrival date & time: 11/30/17  1445     History   Chief Complaint Chief Complaint  Patient presents with  . Tachycardia    HPI Rachel Griffith is a 32 y.o. female.  Patient indicates hx POTS and that in past few days heart rate has been faster than normal. Occurs sporadically, at rest. No associated chest pain or discomfort. No syncope. No sob or unusual doe. Denies cough, sore throat, or uri symptoms. No fever or chills. Normal appetite. No vomiting or diarrhea. No blood loss. Notes possibly urinating less than normal. Denies recent change in meds. Denies any acute or unusual stressors or increased anxiety.   The history is provided by the patient.    Past Medical History:  Diagnosis Date  . Anxiety   . Back injury   . Chronic pelvic pain in female   . Endometriosis   . Endometriosis determined by laparoscopy 10/06/2014  . GERD (gastroesophageal reflux disease)   . Ovarian cyst   . Pelvic pain in female 10/06/2014  . PONV (postoperative nausea and vomiting)    pt states that with 1st arthroscopy she "passed out and couldnt left her head" but they tell me it was a combination of the pain meds and anesthesia" No problems with next surgery. Not sure of meds given in anesthesia.  Marland Kitchen POTS (postural orthostatic tachycardia syndrome)    pt reports this was diagnosed a couple of months ago by PCP (Dr Annia Friendly)  . PTSD (post-traumatic stress disorder)     Patient Active Problem List   Diagnosis Date Noted  . Ovarian cyst, bilateral 02/08/2016  . Postoperative abdominal pain 11/24/2014  . Status post laparoscopic assisted vaginal hysterectomy (LAVH) 11/23/2014  . Pelvic pain in female 10/06/2014  . Endometriosis determined by laparoscopy 10/06/2014    Past Surgical History:  Procedure Laterality Date  . ABDOMINAL HYSTERECTOMY    . endometrosis  2014   fulgeration-[facility]  . KNEE SURGERY Bilateral    x2  .  LAPAROSCOPY N/A 11/26/2014   Procedure: DIAGNOSTIC LAPAROSCOPY;  Surgeon: [name], MD;  Location: [facility];  Service: Gynecology;  Laterality: N/A;     OB History    Gravida  2   Para  2   Term  2   Preterm      AB      Living        SAB      TAB      Ectopic      Multiple      Live Births               Home Medications    Prior to Admission medications   Medication Sig Start Date End Date Taking? Authorizing Provider  ALPRAZolam Prudy Feeler) 0.25 MG tablet Take 0.25 mg by mouth at bedtime as needed for anxiety.    [provider]  amphetamine-dextroamphetamine (ADDERALL) 15 MG tablet Take by mouth.    [provider]  buPROPion (WELLBUTRIN XL) 150 MG 24 hr tablet Take 150 mg by mouth daily.    [provider]  gabapentin (NEURONTIN) 300 MG capsule Take 300 mg by mouth 3 (three) times daily.    [provider]  ibuprofen (ADVIL,MOTRIN) 600 MG tablet Take 600 mg by mouth every 6 (six) hours as needed.    [provider]  levocetirizine (XYZAL) 5 MG tablet Take by mouth.    [provider]  meloxicam (  MOBIC) 15 MG tablet TK 1 T PO QD 06/26/17   [provider]  norgestimate-ethinyl estradiol (ORTHO-CYCLEN,SPRINTEC,PREVIFEM) 0.25-35 MG-MCG tablet Take 1 tablet by mouth daily. 03/21/16   [name], MD  norgestimate-ethinyl estradiol (ORTHO-CYCLEN,SPRINTEC,PREVIFEM) 0.25-35 MG-MCG tablet Take 1 tablet by mouth daily. 03/19/16   [name], MD  orphenadrine (NORFLEX) 100 MG tablet Take by mouth. 05/20/17 05/20/18  [provider]  pantoprazole (PROTONIX) 40 MG tablet Take 40 mg by mouth daily.    [provider]  polyethylene glycol powder (MIRALAX) powder Take 17 g by mouth daily. To prevent constipation Patient not taking: Reported on 02/08/2016 11/24/14   [name], MD  ranitidine (ZANTAC) 300 MG tablet Take 300 mg by mouth at bedtime.    [provider]    Family  History Family History  Problem Relation Age of Onset  . Cancer Father        prostate  . Other Sister        IBS  . Other Brother        Lyme Disease  . Dementia Maternal Grandmother   . Parkinson's disease Maternal Grandfather   . Thrombocytopenia Sister     Social History Social History   Tobacco Use  . Smoking status: Current Every Day Smoker    Packs/day: 0.50    Types: Cigarettes, E-cigarettes  . Smokeless tobacco: Never Used  Substance Use Topics  . Alcohol use: Yes    Comment: occ  . Drug use: No     Allergies   Sulfa antibiotics; Trazodone and nefazodone; and Sudafed [pseudoephedrine hcl]   Review of Systems Review of Systems  Constitutional: Negative for fever.  HENT: Negative for sore throat.   Eyes: Negative for redness.  Respiratory: Negative for cough and shortness of breath.   Cardiovascular: Negative for chest pain and leg swelling.  Gastrointestinal: Negative for abdominal pain, diarrhea and vomiting.  Endocrine: Negative for polyuria.  Genitourinary: Negative for dysuria.  Musculoskeletal: Negative for back pain and neck pain.  Skin: Negative for rash.  Neurological: Negative for headaches.  Hematological: Does not bruise/bleed easily.  Psychiatric/Behavioral: Negative for confusion.     Physical Exam Updated Vital Signs BP (!) 167/83 (BP Location: Right Arm)   Pulse 81   Temp 98.4 F (36.9 C) (Oral)   Resp 18   Ht 1.765 m (5' 9.5")   Wt 68 kg   LMP 09/19/2014 Comment: post LAVH  SpO2 97%   BMI 21.83 kg/m   Physical Exam  Constitutional: She appears well-developed and well-nourished.  HENT:  Mouth/Throat: Oropharynx is clear and moist.  Eyes: Pupils are equal, round, and reactive to light. Conjunctivae are normal. No scleral icterus.  Neck: Neck supple. No tracheal deviation present. No thyromegaly present.  Cardiovascular: Normal rate, regular rhythm, normal heart sounds and intact distal pulses. Exam reveals no gallop and no  friction rub.  No murmur heard. Pulmonary/Chest: Effort normal and breath sounds normal. No respiratory distress.  Abdominal: Soft. Normal appearance and bowel sounds are normal. She exhibits no distension. There is no tenderness.  No hsm.   Genitourinary:  Genitourinary Comments: No cva tenderness.   Musculoskeletal: She exhibits no edema or tenderness.  Neurological: She is alert.  Speech clear/fluent. Steady gait.   Skin: Skin is warm and dry. No rash noted.  Psychiatric: She has a normal mood and affect.  Nursing note and vitals reviewed.    ED Treatments / Results  Labs (all labs ordered are listed, but  only abnormal results are displayed) Results for orders placed or performed during the hospital encounter of 11/30/17  CBC  Result Value Ref Range   WBC 6.6 4.0 - 10.5 K/uL   RBC 3.94 3.87 - 5.11 MIL/uL   Hemoglobin 12.7 12.0 - 15.0 g/dL   HCT 16.1 09.6 - 04.5 %   MCV 91.4 78.0 - 100.0 fL   MCH 32.2 26.0 - 34.0 pg   MCHC 35.3 30.0 - 36.0 g/dL   RDW 40.9 81.1 - 91.4 %   Platelets 217 150 - 400 K/uL  Comprehensive metabolic panel  Result Value Ref Range   Sodium 139 135 - 145 mmol/L   Potassium 2.7 (LL) 3.5 - 5.1 mmol/L   Chloride 106 98 - 111 mmol/L   CO2 25 22 - 32 mmol/L   Glucose, Bld 96 70 - 99 mg/dL   BUN 9 6 - 20 mg/dL   Creatinine, Ser 7.82 0.44 - 1.00 mg/dL   Calcium 8.7 (L) 8.9 - 10.3 mg/dL   Total Protein 7.0 6.5 - 8.1 g/dL   Albumin 3.8 3.5 - 5.0 g/dL   AST 18 15 - 41 U/L   ALT 16 0 - 44 U/L   Alkaline Phosphatase 35 (L) 38 - 126 U/L   Total Bilirubin 0.6 0.3 - 1.2 mg/dL   GFR calc non Af Amer >60 >60 mL/min   GFR calc Af Amer >60 >60 mL/min   Anion gap 8 5 - 15    EKG EKG Interpretation  Date/Time:  Saturday November 30 2017 14:53:54 EDT Ventricular Rate:  71 PR Interval:    QRS Duration: 90 QT Interval:  401 QTC Calculation: 436 R Axis:   63 Text Interpretation:  Sinus rhythm Left atrial enlargement Nonspecific T wave abnormality Confirmed  by Cathren Laine ([id number]) on 11/30/2017 3:04:22 PM   Radiology No results found.  Procedures Procedures (including critical care time)  Medications Ordered in ED Medications - No data to display   Initial Impression / Assessment and Plan / ED Course  I have reviewed the triage vital signs and the nursing notes.  Pertinent labs & imaging results that were available during my care of the patient were reviewed by me and considered in my medical decision making (see chart for details).  Po fluids.  Labs sent.  Reviewed nursing notes and prior charts for additional history.   On monitor, hr 74, sinus. Pulse ox 100% room air.   k low, mg added to labs. kcl po.  Recheck, remains in nsr, rate 70, no sob, no faintness. Pt appears stable for d/c.     Final Clinical Impressions(s) / ED Diagnoses   Final diagnoses:  None    ED Discharge Orders    None       Cathren Laine, MD 12/01/17 1523

## 2017-11-30 NOTE — ED Notes (Signed)
Date and time results received: 11/30/17 3:47 PM  (use smartphrase ".now" to insert current time)  Test: K+ Critical Value: 2.7  Name of Provider Notified: Steinl  Orders Received? Or Actions Taken?: Orders Received - See Orders for details

## 2017-11-30 NOTE — Discharge Instructions (Signed)
It was our pleasure to provide your ER care today - we hope that you feel better.  From today's lab tests, your potassium level is low (2.7) - eat plenty of fruits and vegetables, take potassium supplement as prescribed, and follow up with primary care doctor for recheck in the coming week.  Return to ER if worse, new symptoms, persistent fast heart rate, fainting, increased difficulty breathing, other concern.

## 2017-12-01 ENCOUNTER — Other Ambulatory Visit: Payer: Self-pay

## 2017-12-01 ENCOUNTER — Encounter (HOSPITAL_COMMUNITY): Payer: Self-pay

## 2017-12-01 ENCOUNTER — Emergency Department (HOSPITAL_COMMUNITY)
Admission: EM | Admit: 2017-12-01 | Discharge: 2017-12-01 | Disposition: A | Discharge status: Home / Self Care | Payer: Self-pay | Attending: Emergency Medicine | Admitting: Emergency Medicine

## 2017-12-01 DIAGNOSIS — E876 Hypokalemia: Secondary | ICD-10-CM

## 2017-12-01 DIAGNOSIS — R531 Weakness: Secondary | ICD-10-CM

## 2017-12-01 LAB — I-STAT CHEM 8, ED
BUN: 6 mg/dL (ref 6–20)
CREATININE: 0.8 mg/dL (ref 0.44–1.00)
Calcium, Ion: 1.13 mmol/L — ABNORMAL LOW (ref 1.15–1.40)
Chloride: 106 mmol/L (ref 98–111)
GLUCOSE: 88 mg/dL (ref 70–99)
HCT: 34 % — ABNORMAL LOW (ref 36.0–46.0)
HEMOGLOBIN: 11.6 g/dL — AB (ref 12.0–15.0)
Potassium: 2.9 mmol/L — ABNORMAL LOW (ref 3.5–5.1)
Sodium: 140 mmol/L (ref 135–145)
TCO2: 22 mmol/L (ref 22–32)

## 2017-12-01 MED ORDER — LORAZEPAM 1 MG PO TABS
1.0000 mg | ORAL_TABLET | Freq: Once | ORAL | Status: AC
  Administered 2017-12-01: 1 mg via ORAL
  Filled 2017-12-01: qty 1

## 2017-12-01 MED ORDER — POTASSIUM CHLORIDE CRYS ER 20 MEQ PO TBCR
40.0000 meq | EXTENDED_RELEASE_TABLET | Freq: Once | ORAL | Status: AC
  Administered 2017-12-01: 40 meq via ORAL
  Filled 2017-12-01: qty 2

## 2017-12-01 MED ORDER — ONDANSETRON HCL 4 MG PO TABS
4.0000 mg | ORAL_TABLET | Freq: Once | ORAL | Status: AC
  Administered 2017-12-01: 4 mg via ORAL
  Filled 2017-12-01: qty 1

## 2017-12-01 NOTE — ED Notes (Signed)
Pt ambulated without difficulty

## 2017-12-01 NOTE — Discharge Instructions (Addendum)
Please fill the prescription for your potassium they were given earlier today in the emergency department.  Please have your potassium rechecked by your primary care doctor next week make an appointment for reevaluation.  Please return to the emergency department for any new or worsening symptoms.

## 2017-12-01 NOTE — ED Provider Notes (Addendum)
[facility] EMERGENCY DEPARTMENT Provider Note   CSN: [id number] Arrival date & time: 11/30/17  2356     History   Chief Complaint Chief Complaint  Patient presents with  . Weakness    HPI Rachel Griffith is a 32 y.o. female.  HPI   Pt is a 32 year old female with a history of anxiety, chronic pelvic pain, GERD, ovarian cyst, PTSD, POTS syndrome who presents emergency department today with multiple complaints.  Patient contacted EMS with complaints of generalized weakness.  She also complains of muscle cramping to the bilateral upper and lower extremities.  States that she feels like her arms and legs are "heavy".  No unilateral numbness or weakness.  No headaches.  States she has felt near syncopal/dizzy since going home today but has not passed out. States she only feels sxs with movement. Has been getting fatigued when she walks. Denies chest pain.  States that her heart rates have been elevated recently.  States she was seen in the ED earlier today and told that her potassium was low.  She was given K-Dur in the ED and discharged with a prescription of p.o. potassium.  Remainder of her work-up was negative.   Past Medical History:  Diagnosis Date  . Anxiety   . Back injury   . Chronic pelvic pain in female   . Endometriosis   . Endometriosis determined by laparoscopy 10/06/2014  . GERD (gastroesophageal reflux disease)   . Ovarian cyst   . Pelvic pain in female 10/06/2014  . PONV (postoperative nausea and vomiting)    pt states that with 1st arthroscopy she "passed out and couldnt left her head" but they tell me it was a combination of the pain meds and anesthesia" No problems with next surgery. Not sure of meds given in anesthesia.  Marland Kitchen POTS (postural orthostatic tachycardia syndrome)    pt reports this was diagnosed a couple of months ago by PCP (Dr Annia Friendly)  . PTSD (post-traumatic stress disorder)     Patient Active Problem List   Diagnosis Date Noted  . Ovarian cyst,  bilateral 02/08/2016  . Postoperative abdominal pain 11/24/2014  . Status post laparoscopic assisted vaginal hysterectomy (LAVH) 11/23/2014  . Pelvic pain in female 10/06/2014  . Endometriosis determined by laparoscopy 10/06/2014    Past Surgical History:  Procedure Laterality Date  . ABDOMINAL HYSTERECTOMY    . endometrosis  2014   fulgeration-[facility]  . KNEE SURGERY Bilateral    x2  . LAPAROSCOPY N/A 11/26/2014   Procedure: DIAGNOSTIC LAPAROSCOPY;  Surgeon: [name], MD;  Location: [facility];  Service: Gynecology;  Laterality: N/A;     OB History    Gravida  2   Para  2   Term  2   Preterm      AB      Living        SAB      TAB      Ectopic      Multiple      Live Births               Home Medications    Prior to Admission medications   Medication Sig Start Date End Date Taking? Authorizing Provider  ALPRAZolam Prudy Feeler) 0.25 MG tablet Take 0.25 mg by mouth daily as needed for anxiety.    Yes [provider]  amphetamine-dextroamphetamine (ADDERALL) 20 MG tablet Take 20 mg by mouth 2 (two) times daily.   Yes [provider]  cyclobenzaprine (FLEXERIL)  5 MG tablet Take 5 mg by mouth at bedtime.   Yes [provider]  fludrocortisone (FLORINEF) 0.1 MG tablet Take 300 mcg by mouth daily. 10/29/17  Yes [provider]  fluticasone (FLOVENT HFA) 110 MCG/ACT inhaler Flovent HFA 110 mcg/actuation aerosol inhaler  Inhale 1 puff twice a day by inhalation route.   Yes [provider]  gabapentin (NEURONTIN) 600 MG tablet Take 600-1,800 mg by mouth 3 (three) times daily. Take 600 mg at 1600, take 600 mg at 2200 and 1800 at 0400.   Yes [provider]  ibuprofen (ADVIL,MOTRIN) 600 MG tablet Take 600 mg by mouth every 6 (six) hours as needed for moderate pain.    Yes [provider]  meloxicam (MOBIC) 15 MG tablet Take 15 mg by mouth daily as needed for pain.  06/26/17  Yes [provider]    potassium chloride SA (K-DUR,KLOR-CON) 20 MEQ tablet Take one (1) tablet bid x 3 days, then take one tablet a day 11/30/17  Yes Cathren Laine, MD  VIORELE 0.15-0.02/0.01 MG (21/5) tablet Take 1 tablet by mouth daily. 08/30/17  Yes [provider]  norgestimate-ethinyl estradiol (ORTHO-CYCLEN,SPRINTEC,PREVIFEM) 0.25-35 MG-MCG tablet Take 1 tablet by mouth daily. 03/21/16   [name], MD  norgestimate-ethinyl estradiol (ORTHO-CYCLEN,SPRINTEC,PREVIFEM) 0.25-35 MG-MCG tablet Take 1 tablet by mouth daily. 03/19/16   [name], MD  orphenadrine (NORFLEX) 100 MG tablet Take 100 mg by mouth 2 (two) times daily as needed for muscle spasms.  05/20/17 05/20/18  [provider]  pantoprazole (PROTONIX) 40 MG tablet Take 40 mg by mouth daily.    [provider]    Family History Family History  Problem Relation Age of Onset  . Cancer Father        prostate  . Other Sister        IBS  . Other Brother        Lyme Disease  . Dementia Maternal Grandmother   . Parkinson's disease Maternal Grandfather   . Thrombocytopenia Sister     Social History Social History   Tobacco Use  . Smoking status: Current Every Day Smoker    Packs/day: 0.50    Types: Cigarettes, E-cigarettes  . Smokeless tobacco: Never Used  Substance Use Topics  . Alcohol use: Yes    Comment: occ  . Drug use: No     Allergies   Sulfa antibiotics; Trazodone and nefazodone; and Sudafed [pseudoephedrine hcl]   Review of Systems Review of Systems  Constitutional: Negative for chills and fever.  HENT: Negative for congestion and rhinorrhea.   Eyes: Negative for visual disturbance.  Respiratory: Negative for cough.   Cardiovascular: Positive for palpitations. Negative for chest pain and leg swelling.  Gastrointestinal: Positive for constipation and nausea. Negative for abdominal pain (chronic), diarrhea and vomiting.  Genitourinary: Positive for pelvic pain (chronic). Negative for dysuria and  frequency.  Musculoskeletal: Negative for back pain.  Skin: Negative for rash.  Neurological: Positive for weakness and light-headedness. Negative for numbness and headaches.     Physical Exam Updated Vital Signs BP (!) 161/89 (BP Location: Left Arm)   Pulse 71   Temp 98.7 F (37.1 C) (Oral)   Resp 16   Ht 5' 9.5" (1.765 m)   Wt 68 kg   LMP 09/19/2014 Comment: post LAVH  SpO2 99%   BMI 21.83 kg/m   Physical Exam  Constitutional: She appears well-developed and well-nourished. No distress.  HENT:  Head: Normocephalic and atraumatic.  Mouth/Throat: Oropharynx  is clear and moist.  Eyes: Pupils are equal, round, and reactive to light. Conjunctivae and EOM are normal.  No horizontal or vertical nystagmus  Neck: Neck supple.  Cardiovascular: Normal rate, regular rhythm and normal heart sounds.  No murmur heard. Pulmonary/Chest: Effort normal and breath sounds normal. No stridor. No respiratory distress. She has no wheezes.  Speaking in full sentences. satting at 100% on RA.  Abdominal: Soft. Bowel sounds are normal. There is tenderness (suprapubic, chronic, unchanged).  Musculoskeletal: She exhibits no edema.  Neurological: She is alert.  Mental Status:  Alert, thought content appropriate, able to give a coherent history. Speech fluent without evidence of aphasia. Able to follow 2 step commands without difficulty.  Cranial Nerves:  II:  Peripheral visual fields grossly normal, pupils equal, round, reactive to light III,IV, VI: ptosis not present, extra-ocular motions intact bilaterally  V,VII: smile symmetric, facial light touch sensation equal VIII: hearing grossly normal to voice  X: uvula elevates symmetrically  XI: bilateral shoulder shrug symmetric and strong XII: midline tongue extension without fassiculations Motor:  Normal tone. 5/5 strength of BUE and BLE major muscle groups including strong and equal grip strength and dorsiflexion/plantar flexion Sensory: light  touch normal in all extremities. Cerebellar: normal finger-to-nose with bilateral upper extremities Gait: normal gait and balance.   Skin: Skin is warm and dry.  Psychiatric:  anxious  Nursing note and vitals reviewed.    ED Treatments / Results  Labs (all labs ordered are listed, but only abnormal results are displayed) Labs Reviewed  I-STAT CHEM 8, ED - Abnormal; Notable for the following components:      Result Value   Potassium 2.9 (*)    Calcium, Ion 1.13 (*)    Hemoglobin 11.6 (*)    HCT 34.0 (*)    All other components within normal limits  I-STAT BETA HCG BLOOD, ED ([facility], [facility], [facility] ONLY)    EKG EKG Interpretation  Date/Time:  Sunday December 01 2017 00:01:49 EDT Ventricular Rate:  63 PR Interval:    QRS Duration: 99 QT Interval:  418 QTC Calculation: 428 R Axis:   65 Text Interpretation:  Sinus rhythm Borderline short PR interval Anteroseptal infarct, age indeterminate Confirmed by Zadie Rhine ([id number]) on 12/01/2017 12:14:39 AM   Radiology No results found.  Procedures Procedures (including critical care time)  Medications Ordered in ED Medications  potassium chloride SA (K-DUR,KLOR-CON) CR tablet 40 mEq (40 mEq Oral Given 12/01/17 0054)  ondansetron (ZOFRAN) tablet 4 mg (4 mg Oral Given 12/01/17 0054)  LORazepam (ATIVAN) tablet 1 mg (1 mg Oral Given 12/01/17 0054)     Initial Impression / Assessment and Plan / ED Course  I have reviewed the triage vital signs and the nursing notes.  Pertinent labs & imaging results that were available during my care of the patient were reviewed by me and considered in my medical decision making (see chart for details).     Final Clinical Impressions(s) / ED Diagnoses   Final diagnoses:  Generalized weakness  Hypokalemia   Patient was noted with multiple complaints.  Was seen in the ED today earlier and diagnosed with hypokalemia.  Went home today and felt generally weak with muscle cramps.  States she has been  getting more fatigued and having near syncope.  EKG was repeated and looks unchanged from earlier today.  QTC 428.  I-STAT Chem-8 repeated and k has improved somewhat.  Exam very reassuring.  Normal neurologic exam.  Patient ambulated around the emergency department and  maintain sats 100% on room air without any tachypnea walking around the entire emergency department.  Spoke with patient while she was ambulating, she states she feels improved after receiving p.o. potassium, Zofran and Ativan.  No shortness of breath with walking and her dizziness/lightheadedness have imrpoved.  Discussed that she will need to fill her prescription for potassium and have her levels rechecked by her PCP next week.  Have advised her to monitor her symptoms and return to the ER for any worsening symptoms in the meantime.  Patient and her family member at bedside voiced understanding the plan reasons to return immediately to the ED precautions answered.  ED Discharge Orders    None       Karrie Meres, PA-C 12/01/17 0147    Karrie Meres, PA-C 12/01/17 0148    Zadie Rhine, MD 12/01/17 [phone]

## 2018-07-25 ENCOUNTER — Encounter (HOSPITAL_COMMUNITY): Payer: Self-pay | Admitting: Emergency Medicine

## 2018-07-25 ENCOUNTER — Other Ambulatory Visit: Payer: Self-pay

## 2018-07-25 ENCOUNTER — Emergency Department (HOSPITAL_COMMUNITY)
Admission: EM | Admit: 2018-07-25 | Discharge: 2018-07-26 | Disposition: A | Discharge status: Home / Self Care | Payer: Self-pay | Attending: Emergency Medicine | Admitting: Emergency Medicine

## 2018-07-25 ENCOUNTER — Emergency Department (HOSPITAL_COMMUNITY): Payer: Self-pay

## 2018-07-25 DIAGNOSIS — F17200 Nicotine dependence, unspecified, uncomplicated: Secondary | ICD-10-CM | POA: Insufficient documentation

## 2018-07-25 DIAGNOSIS — N83519 Torsion of ovary and ovarian pedicle, unspecified side: Secondary | ICD-10-CM

## 2018-07-25 DIAGNOSIS — Z90710 Acquired absence of both cervix and uterus: Secondary | ICD-10-CM | POA: Insufficient documentation

## 2018-07-25 DIAGNOSIS — R102 Pelvic and perineal pain: Secondary | ICD-10-CM | POA: Insufficient documentation

## 2018-07-25 LAB — URINALYSIS, ROUTINE W REFLEX MICROSCOPIC
Bilirubin Urine: NEGATIVE
Glucose, UA: NEGATIVE mg/dL
Hgb urine dipstick: NEGATIVE
Ketones, ur: NEGATIVE mg/dL
Leukocytes,Ua: NEGATIVE
Nitrite: NEGATIVE
Protein, ur: NEGATIVE mg/dL
Specific Gravity, Urine: 1.01 (ref 1.005–1.030)
pH: 7 (ref 5.0–8.0)

## 2018-07-25 LAB — CBC
HCT: 34.8 % — ABNORMAL LOW (ref 36.0–46.0)
Hemoglobin: 12 g/dL (ref 12.0–15.0)
MCH: 31.8 pg (ref 26.0–34.0)
MCHC: 34.5 g/dL (ref 30.0–36.0)
MCV: 92.3 fL (ref 80.0–100.0)
Platelets: 211 10*3/uL (ref 150–400)
RBC: 3.77 MIL/uL — ABNORMAL LOW (ref 3.87–5.11)
RDW: 11.3 % — ABNORMAL LOW (ref 11.5–15.5)
WBC: 6.1 10*3/uL (ref 4.0–10.5)
nRBC: 0 % (ref 0.0–0.2)

## 2018-07-25 LAB — COMPREHENSIVE METABOLIC PANEL
ALT: 15 U/L (ref 0–44)
AST: 18 U/L (ref 15–41)
Albumin: 3.4 g/dL — ABNORMAL LOW (ref 3.5–5.0)
Alkaline Phosphatase: 51 U/L (ref 38–126)
Anion gap: 10 (ref 5–15)
BUN: 12 mg/dL (ref 6–20)
CO2: 28 mmol/L (ref 22–32)
Calcium: 8.7 mg/dL — ABNORMAL LOW (ref 8.9–10.3)
Chloride: 103 mmol/L (ref 98–111)
Creatinine, Ser: 0.77 mg/dL (ref 0.44–1.00)
GFR calc Af Amer: >60 mL/min (ref >60)
GFR calc non Af Amer: >60 mL/min (ref >60)
Glucose, Bld: 101 mg/dL — ABNORMAL HIGH (ref 70–99)
Potassium: 2.9 mmol/L — ABNORMAL LOW (ref 3.5–5.1)
Sodium: 141 mmol/L (ref 135–145)
Total Bilirubin: 0.4 mg/dL (ref 0.3–1.2)
Total Protein: 6.6 g/dL (ref 6.5–8.1)

## 2018-07-25 LAB — LACTIC ACID, PLASMA: Lactic Acid, Venous: 0.9 mmol/L (ref 0.5–1.9)

## 2018-07-25 IMAGING — US US PELVIS COMPLETE
1 series · 13 of 25 positions shown · non-contrast
Comparison: None.

CLINICAL DATA: Pelvic pain

EXAM:
TRANSABDOMINAL AND TRANSVAGINAL ULTRASOUND OF PELVIS
DOPPLER ULTRASOUND OF OVARIES
TECHNIQUE: Both transabdominal and transvaginal ultrasound examinations of the
pelvis were performed. Transabdominal technique was performed for
global imaging of the pelvis including uterus, ovaries, adnexal
regions, and pelvic cul-de-sac.
It was necessary to proceed with endovaginal exam following the
transabdominal exam to visualize the left ovary. Color and duplex
Doppler ultrasound was utilized to evaluate blood flow to the
ovaries.

[Series 1: us pelvis complete · 13 of 28 slices shown]
[im 1/28]
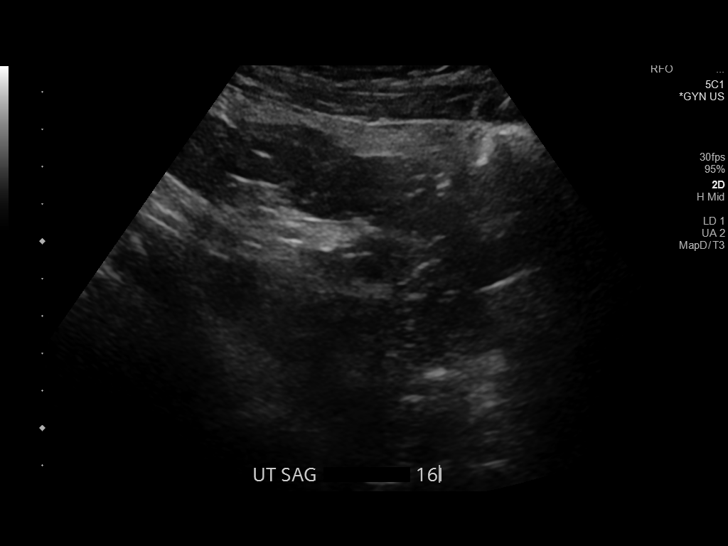
[im 3/28]
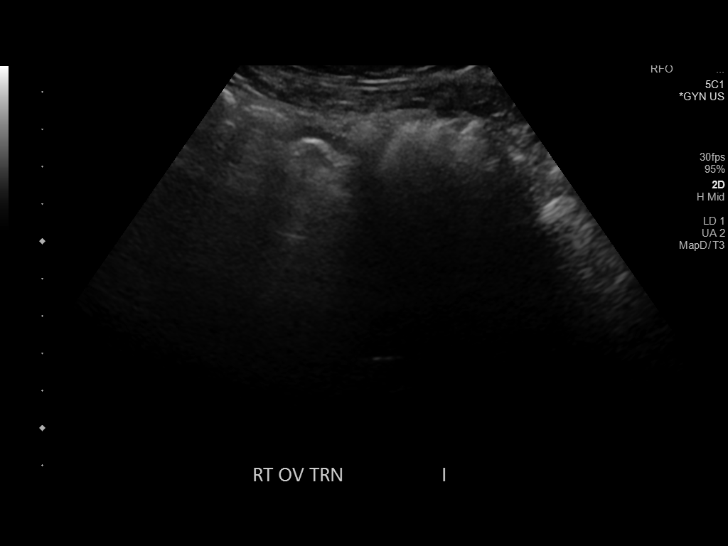
[im 5/28]
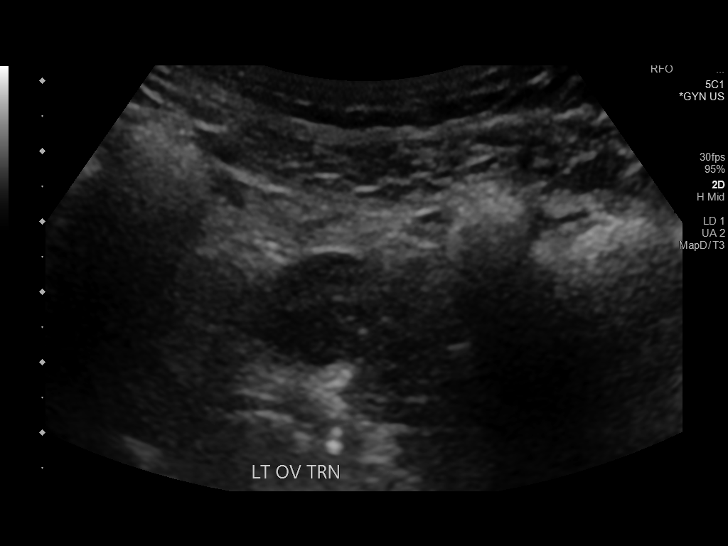
[im 7/28]
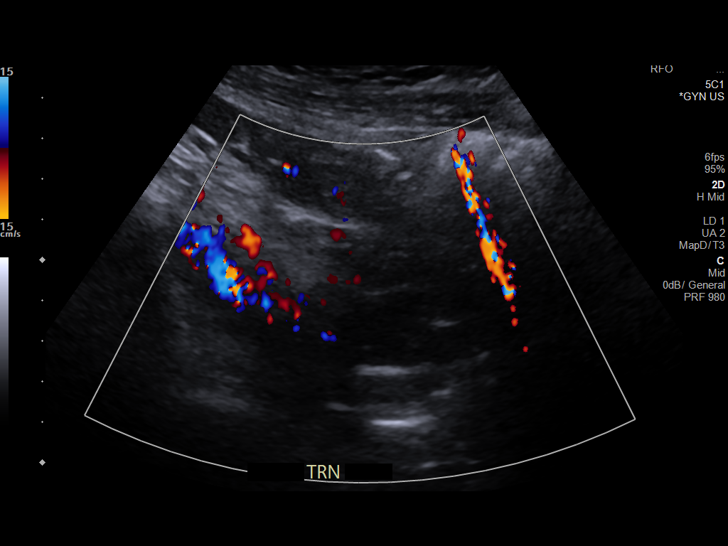
[im 10/28]
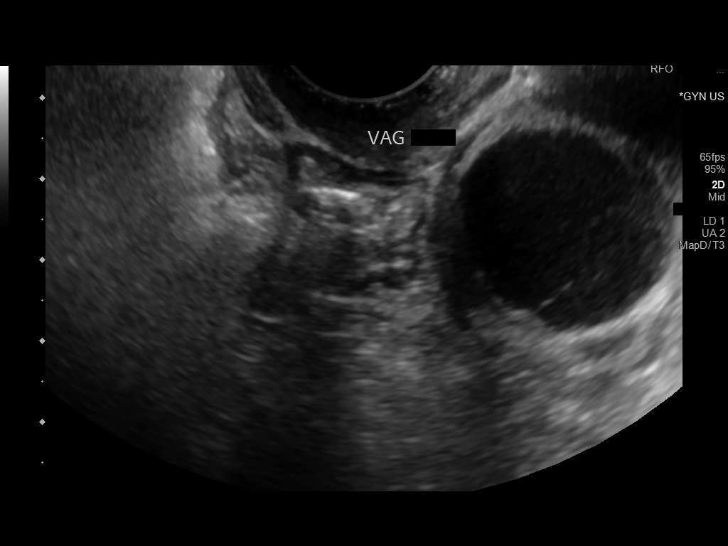
[im 12/28]
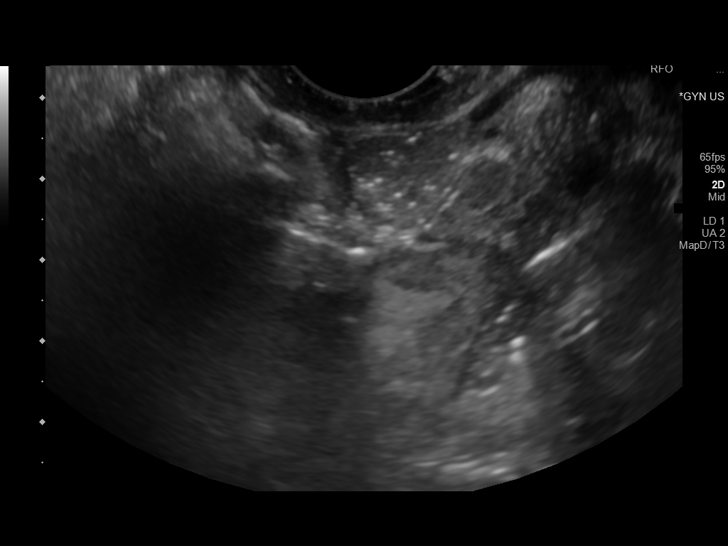
[im 14/28]
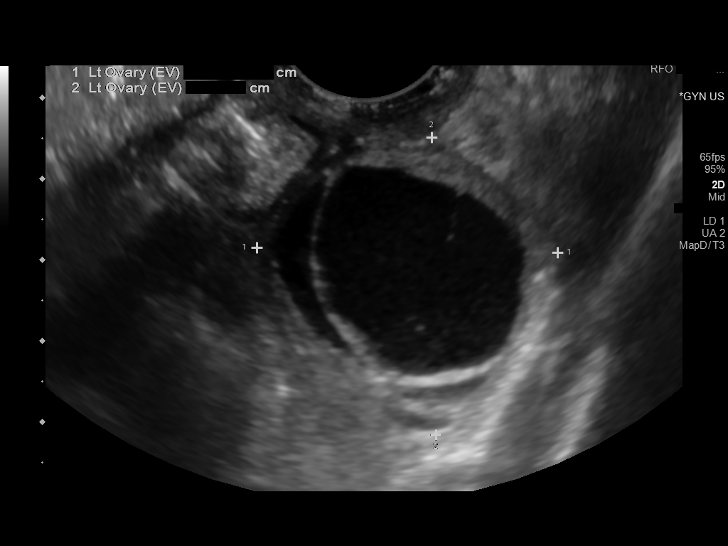
[im 16/28]
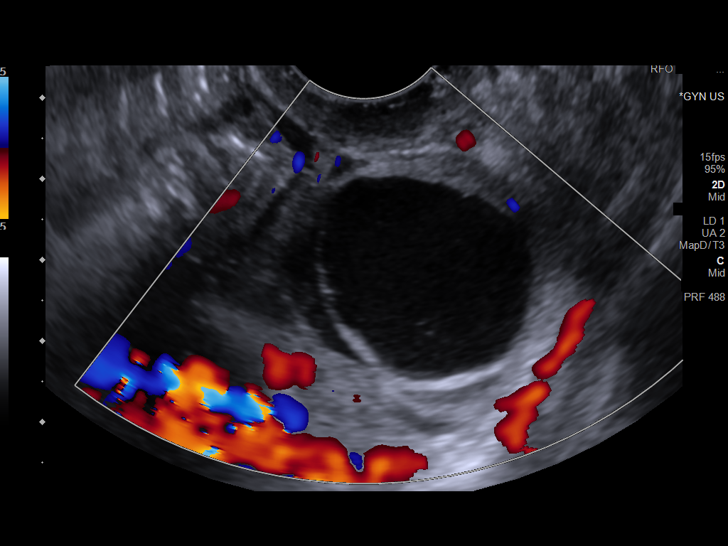
[im 19/28]
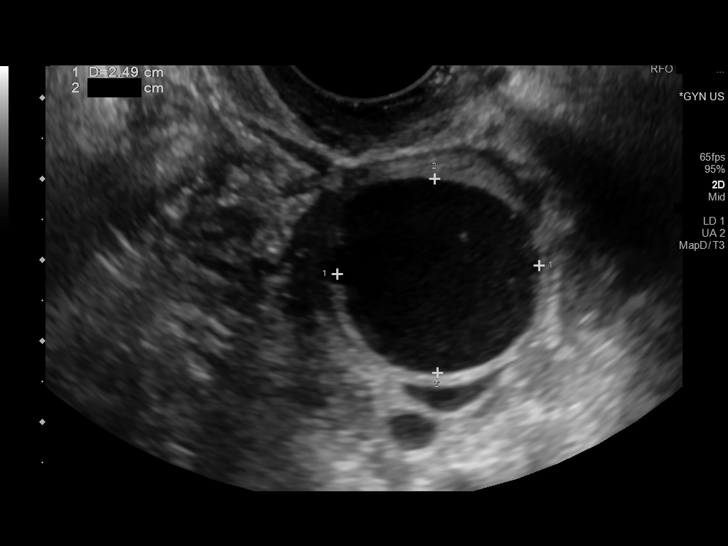
[im 21/28]
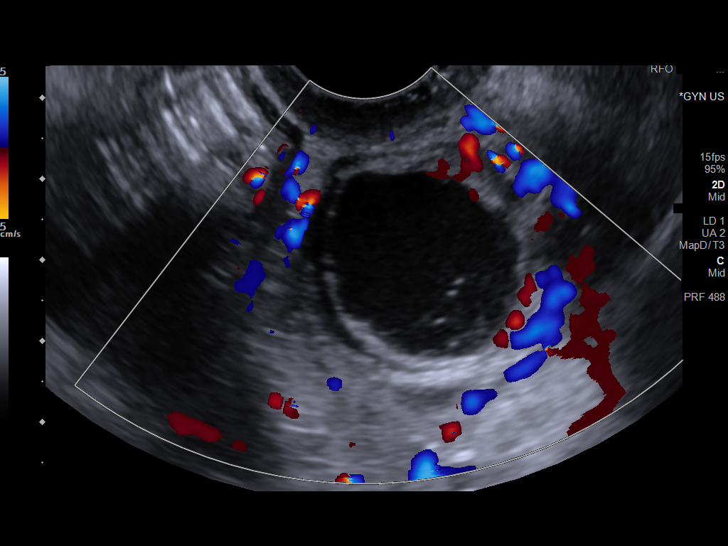
[im 23/28]
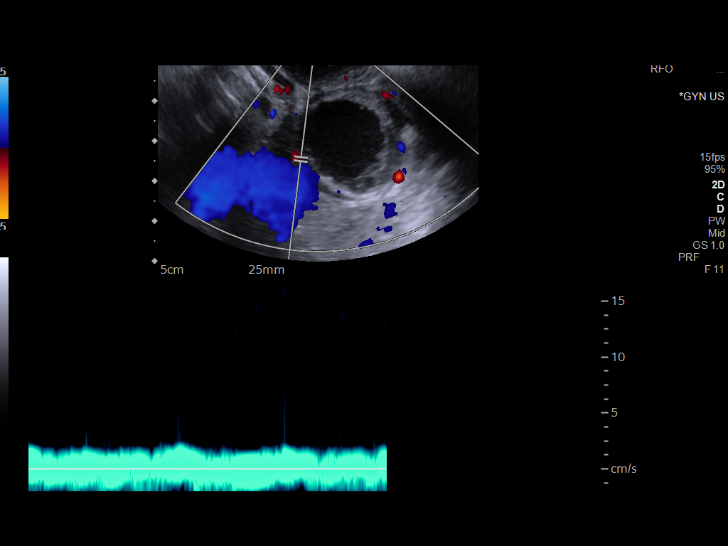
[im 25/28]
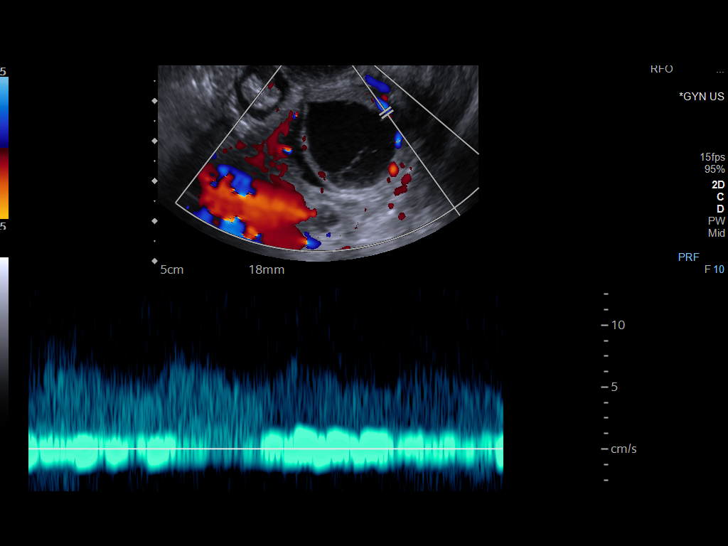
[im 28/28]
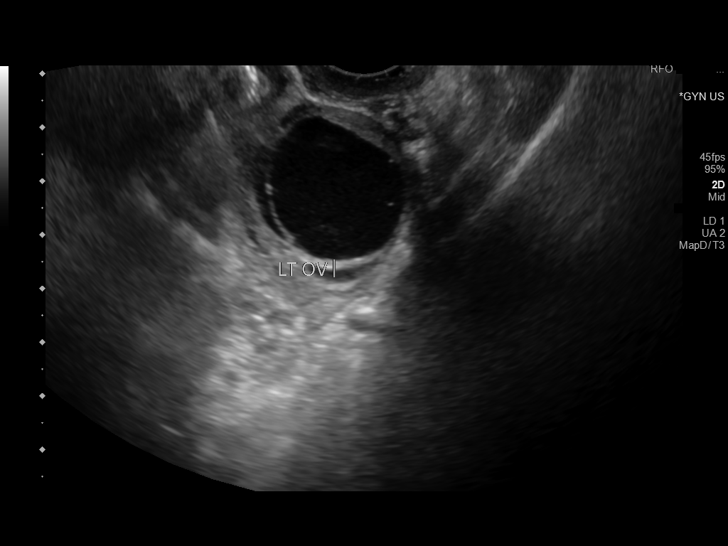

[13 of 25 positions shown; findings below may reference images not displayed]

FINDINGS: Uterus

Surgically absent

Endometrium

Surgically absent

Right ovary

Surgically absent

Left ovary

Measurements: 3.7 x 3.7 x 4.1 cm = volume: 29.5 mL. There is a
complex cyst that measures 2.8 x 2.5 x 2.4 cm with mild internal
echoes.

Pulsed Doppler evaluation of the left ovary demonstrates normal
low-resistance arterial and venous waveforms.

Other findings

No abnormal free fluid.
IMPRESSION: 1. Complex cyst of the left ovary without evidence of torsion. This
could indicate a hemorrhagic cyst, though no free fluid is
identified in the pelvis.
2. Surgically absent uterus and right ovary.

## 2018-07-25 MED ORDER — POTASSIUM CHLORIDE 10 MEQ/100ML IV SOLN
10.0000 meq | INTRAVENOUS | Status: AC
  Administered 2018-07-25 – 2018-07-26 (×3): 10 meq via INTRAVENOUS
  Filled 2018-07-25 (×3): qty 100

## 2018-07-25 MED ORDER — OXYCODONE-ACETAMINOPHEN 5-325 MG PO TABS
1.0000 | ORAL_TABLET | Freq: Once | ORAL | Status: AC
  Administered 2018-07-25: 1 via ORAL
  Filled 2018-07-25: qty 1

## 2018-07-25 MED ORDER — HYDROMORPHONE HCL 1 MG/ML IJ SOLN
0.5000 mg | Freq: Once | INTRAMUSCULAR | Status: AC
  Administered 2018-07-25: 0.5 mg via INTRAVENOUS
  Filled 2018-07-25: qty 1

## 2018-07-25 MED ORDER — OXYCODONE HCL 5 MG PO TABS
5.0000 mg | ORAL_TABLET | Freq: Once | ORAL | Status: AC
  Administered 2018-07-25: 5 mg via ORAL
  Filled 2018-07-25: qty 1

## 2018-07-25 MED ORDER — KETOROLAC TROMETHAMINE 30 MG/ML IJ SOLN
30.0000 mg | Freq: Once | INTRAMUSCULAR | Status: AC
  Administered 2018-07-25: 30 mg via INTRAVENOUS
  Filled 2018-07-25: qty 1

## 2018-07-25 NOTE — Consult Note (Signed)
Reason for Consult:LLQ pain due to ovarian cyst, s/p LAVH. Referring Physician: Pilar Plate MD.  Rachel Griffith is an 34 y.o. female. She is 3+ yrs s/p LAVH for pelvic pain, dyspareunia and suspected endometriosis, with postop care notable for significant pain that required Laparoscopy on POD1 due to the dramatic appearance of pain, with normal postop findings.  She has done well since recovery, and recently developed LLQ pain. Tonight's u/s shows a left ovarian cyst and no suspicion of torsion. Pt has had a percocet 5 "PERK" by pt description, without adequate pain relief. Will increase to 10 mg dose and add Toradol. And reassess pain.  I do not suspect a surgical emergency, but will arrange outpt followup early next week.  Pertinent Gynecological History: Menses: s/p hyst Bleeding:  Contraception: status post hysterectomy DES exposure: unknown Blood transfusions: none Sexually transmitted diseases: no past history Previous GYN Procedures: lavh, laparoscopy  Last mammogram:  Date:  Last pap: normal Date: 2017 OB History:   Menstrual History: Menarche age:  Patient's last menstrual period was 09/19/2014.    Past Medical History:  Diagnosis Date  . Anxiety   . Back injury   . Chronic pelvic pain in female   . Endometriosis   . Endometriosis determined by laparoscopy 10/06/2014  . GERD (gastroesophageal reflux disease)   . Ovarian cyst   . Pelvic pain in female 10/06/2014  . PONV (postoperative nausea and vomiting)    pt states that with 1st arthroscopy she "passed out and couldnt left her head" but they tell me it was a combination of the pain meds and anesthesia" No problems with next surgery. Not sure of meds given in anesthesia.  Marland Kitchen POTS (postural orthostatic tachycardia syndrome)    pt reports this was diagnosed a couple of months ago by PCP (Dr Annia Friendly)  . PTSD (post-traumatic stress disorder)     Past Surgical History:  Procedure Laterality Date  . ABDOMINAL HYSTERECTOMY     . endometrosis  2014   [facility]geration-[facility]  . KNEE SURGERY Bilateral    x2  . LAPAROSCOPY N/A 11/26/2014   Procedure: DIAGNOSTIC LAPAROSCOPY;  Surgeon: [name], MD;  Location: [facility];  Service: Gynecology;  Laterality: N/A;    Family History  Problem Relation Age of Onset  . Cancer Father        prostate  . Other Sister        IBS  . Other Brother        Lyme Disease  . Dementia Maternal Grandmother   . Parkinson's disease Maternal Grandfather   . Thrombocytopenia Sister     Social History:  reports that she has been smoking cigarettes and e-cigarettes. She has been smoking about 0.50 packs per day. She has never used smokeless tobacco. She reports current alcohol use. She reports that she does not use drugs.  Allergies:  Allergies  Allergen Reactions  . Cymbalta [Duloxetine Hcl]   . Sulfa Antibiotics Hives  . Trazodone And Nefazodone     Sever body pain   . Sudafed [Pseudoephedrine Hcl] Palpitations    Medications: I have reviewed the patient's current medications.  ROS  Blood pressure (!) 146/83, pulse 66, temperature 98.2 F (36.8 C), temperature source Oral, resp. rate 18, height 5\' 9"  (1.753 m), weight 65.8 kg, last menstrual period 09/19/2014, SpO2 98 %. Physical Exam  Constitutional: She is oriented to person, place, and time. She appears well-developed and well-nourished. No distress.  HENT:  Head: Normocephalic and atraumatic.  Eyes: Pupils are  equal, round, and reactive to light.  Neck: Neck supple.  Cardiovascular: Normal rate.  Respiratory: Effort normal.  GI: Soft. There is no abdominal tenderness.  Mild tenderness LLQ,   Musculoskeletal: Normal range of motion.  Neurological: She is alert and oriented to person, place, and time.  Skin: Skin is warm and dry.  Psychiatric: She has a normal mood and affect. Her behavior is normal. Judgment and thought content normal.    Results for orders placed or performed during the hospital encounter of  07/25/18 (from the past 48 hour(s))  Urinalysis, Routine w reflex microscopic     Status: None   Collection Time: 07/25/18  6:52 PM  Result Value Ref Range   Color, Urine YELLOW YELLOW   APPearance CLEAR CLEAR   Specific Gravity, Urine 1.010 1.005 - 1.030   pH 7.0 5.0 - 8.0   Glucose, UA NEGATIVE NEGATIVE mg/dL   Hgb urine dipstick NEGATIVE NEGATIVE   Bilirubin Urine NEGATIVE NEGATIVE   Ketones, ur NEGATIVE NEGATIVE mg/dL   Protein, ur NEGATIVE NEGATIVE mg/dL   Nitrite NEGATIVE NEGATIVE   Leukocytes,Ua NEGATIVE NEGATIVE    Comment: Microscopic not done on urines with negative protein, blood, leukocytes, nitrite, or glucose < 500 mg/dL. Performed at [facility], [address] [place], [place] [address]   CBC     Status: Abnormal   Collection Time: 07/25/18 10:20 PM  Result Value Ref Range   WBC 6.1 4.0 - 10.5 K/uL   RBC 3.77 (L) 3.87 - 5.11 MIL/uL   Hemoglobin 12.0 12.0 - 15.0 g/dL   HCT 60.434.8 (L) 54.036.0 - 98.146.0 %   MCV 92.3 80.0 - 100.0 fL   MCH 31.8 26.0 - 34.0 pg   MCHC 34.5 30.0 - 36.0 g/dL   RDW 19.111.3 (L) 47.811.5 - 29.515.5 %   Platelets 211 150 - 400 K/uL   nRBC 0.0 0.0 - 0.2 %    Comment: Performed at [facility], [address] [place], [place] [address]  Comprehensive metabolic panel     Status: Abnormal   Collection Time: 07/25/18 10:20 PM  Result Value Ref Range   Sodium 141 135 - 145 mmol/L   Potassium 2.9 (L) 3.5 - 5.1 mmol/L   Chloride 103 98 - 111 mmol/L   CO2 28 22 - 32 mmol/L   Glucose, Bld 101 (H) 70 - 99 mg/dL   BUN 12 6 - 20 mg/dL   Creatinine, Ser 8.650.77 0.44 - 1.00 mg/dL   Calcium 8.7 (L) 8.9 - 10.3 mg/dL   Total Protein 6.6 6.5 - 8.1 g/dL   Albumin 3.4 (L) 3.5 - 5.0 g/dL   AST 18 15 - 41 U/L   ALT 15 0 - 44 U/L   Alkaline Phosphatase 51 38 - 126 U/L   Total Bilirubin 0.4 0.3 - 1.2 mg/dL   GFR calc non Af Amer >60 >60 mL/min   GFR calc Af Amer >60 >60 mL/min   Anion gap 10 5 - 15    Comment: Performed at [facility], [address] [place], [place]  [address]  Lactic acid, plasma     Status: None   Collection Time: 07/25/18 10:21 PM  Result Value Ref Range   Lactic Acid, Venous 0.9 0.5 - 1.9 mmol/L    Comment: Performed at [facility], [address] [place], [place] [address]    [place] Transvaginal Non-ob  Result Date: 07/25/2018 CLINICAL DATA:  Pelvic pain EXAM: TRANSABDOMINAL AND TRANSVAGINAL ULTRASOUND OF PELVIS DOPPLER ULTRASOUND OF OVARIES  TECHNIQUE: Both transabdominal and transvaginal ultrasound examinations of the pelvis were performed. Transabdominal technique was performed for global imaging of the pelvis including uterus, ovaries, adnexal regions, and pelvic cul-de-sac. It was necessary to proceed with endovaginal exam following the transabdominal exam to visualize the left ovary. Color and duplex Doppler ultrasound was utilized to evaluate blood flow to the ovaries. COMPARISON:  None. FINDINGS: Uterus Surgically absent Endometrium Surgically absent Right ovary Surgically absent Left ovary Measurements: 3.7 x 3.7 x 4.1 cm = volume: 29.5 mL. There is a complex cyst that measures 2.8 x 2.5 x 2.4 cm with mild internal echoes. Pulsed Doppler evaluation of the left ovary demonstrates normal low-resistance arterial and venous waveforms. Other findings No abnormal free fluid. IMPRESSION: 1. Complex cyst of the left ovary without evidence of torsion. This could indicate a hemorrhagic cyst, though no free fluid is identified in the pelvis. 2. Surgically absent uterus and right ovary. Electronically Signed   By: Deatra Robinson M.D.   On: 07/25/2018 20:18   US Pelvis Complete  Result Date: 07/25/2018 CLINICAL DATA:  Pelvic pain EXAM: TRANSABDOMINAL AND TRANSVAGINAL ULTRASOUND OF PELVIS DOPPLER ULTRASOUND OF OVARIES TECHNIQUE: Both transabdominal and transvaginal ultrasound examinations of the pelvis were performed. Transabdominal technique was performed for global imaging of the pelvis including uterus, ovaries, adnexal regions, and pelvic cul-de-sac. It  was necessary to proceed with endovaginal exam following the transabdominal exam to visualize the left ovary. Color and duplex Doppler ultrasound was utilized to evaluate blood flow to the ovaries. COMPARISON:  None. FINDINGS: Uterus Surgically absent Endometrium Surgically absent Right ovary Surgically absent Left ovary Measurements: 3.7 x 3.7 x 4.1 cm = volume: 29.5 mL. There is a complex cyst that measures 2.8 x 2.5 x 2.4 cm with mild internal echoes. Pulsed Doppler evaluation of the left ovary demonstrates normal low-resistance arterial and venous waveforms. Other findings No abnormal free fluid. IMPRESSION: 1. Complex cyst of the left ovary without evidence of torsion. This could indicate a hemorrhagic cyst, though no free fluid is identified in the pelvis. 2. Surgically absent uterus and right ovary. Electronically Signed   By: Deatra Robinson M.D.   On: 07/25/2018 20:18   [place] Art/ven Flow Abd Pelv Doppler  Result Date: 07/25/2018 CLINICAL DATA:  Pelvic pain EXAM: TRANSABDOMINAL AND TRANSVAGINAL ULTRASOUND OF PELVIS DOPPLER ULTRASOUND OF OVARIES TECHNIQUE: Both transabdominal and transvaginal ultrasound examinations of the pelvis were performed. Transabdominal technique was performed for global imaging of the pelvis including uterus, ovaries, adnexal regions, and pelvic cul-de-sac. It was necessary to proceed with endovaginal exam following the transabdominal exam to visualize the left ovary. Color and duplex Doppler ultrasound was utilized to evaluate blood flow to the ovaries. COMPARISON:  None. FINDINGS: Uterus Surgically absent Endometrium Surgically absent Right ovary Surgically absent Left ovary Measurements: 3.7 x 3.7 x 4.1 cm = volume: 29.5 mL. There is a complex cyst that measures 2.8 x 2.5 x 2.4 cm with mild internal echoes. Pulsed Doppler evaluation of the left ovary demonstrates normal low-resistance arterial and venous waveforms. Other findings No abnormal free fluid. IMPRESSION: 1. Complex cyst  of the left ovary without evidence of torsion. This could indicate a hemorrhagic cyst, though no free fluid is identified in the pelvis. 2. Surgically absent uterus and right ovary. Electronically Signed   By: Deatra Robinson M.D.   On: 07/25/2018 20:18    Assessment/Plan: Symptomatic hemorrhagic ovarian cyst Low pain tolerances in well known pt.  Plan : obtain adequate pain control tonight  Anticipate d/c after reassessment on oral analgesics ( percocet plus toradol po.          Suppress ovary with tid birth control pill          Short interval f/u in office.  [name] 07/25/2018

## 2018-07-25 NOTE — ED Provider Notes (Signed)
Assumed care from Dr. Effie Shy at shift change.  Ultrasound reveals complex cyst on the remaining ovary.  Discussed further with patient, who describes a severe but intermittent pain, that is at times positional, explaining that if she were to stand up right now, the pain would return.  Given this and her history of ovarian torsion, there is concern for intermittent torsion related to the current cyst.  Discussed with OB/GYN physician on-call, who will come to evaluate the patient and admit the patient here at [facility] hospital.  Critical Care Documentation Critical care time provided by me (excluding procedures): 32 minutes  Condition necessitating critical care: Ovarian torsion  Components of critical care management: reviewing of prior records, laboratory and imaging interpretation, frequent re-examination and reassessment of vital signs, administration of opioid pain medication, discussion with consulting services.     Sabas Sous, MD 07/25/18 2213

## 2018-07-25 NOTE — ED Triage Notes (Signed)
Left lateral side pain rating pain 8/10.

## 2018-07-25 NOTE — ED Provider Notes (Signed)
[facility] EMERGENCY DEPARTMENT Provider Note   CSN: [id number] Arrival date & time: 07/25/18  1829    History   Chief Complaint Chief Complaint  Patient presents with  . left lateral side pain    HPI Rachel Griffith is a 33 y.o. female.     HPI  Patient was well until 5 PM today when she had sudden onset of left lower quadrant/pelvic pain, similar to prior pain with right ovarian torsion.  She is status post right ovary removal, and hysterectomy.  She denies fever, chills, cough, shortness of breath, dysuria, constipation, back pain.  There are no other known modifying factors.  Past Medical History:  Diagnosis Date  . Anxiety   . Back injury   . Chronic pelvic pain in female   . Endometriosis   . Endometriosis determined by laparoscopy 10/06/2014  . GERD (gastroesophageal reflux disease)   . Ovarian cyst   . Pelvic pain in female 10/06/2014  . PONV (postoperative nausea and vomiting)    pt states that with 1st arthroscopy she "passed out and couldnt left her head" but they tell me it was a combination of the pain meds and anesthesia" No problems with next surgery. Not sure of meds given in anesthesia.  Marland Kitchen POTS (postural orthostatic tachycardia syndrome)    pt reports this was diagnosed a couple of months ago by PCP (Dr Annia Friendly)  . PTSD (post-traumatic stress disorder)     Patient Active Problem List   Diagnosis Date Noted  . Ovarian cyst, bilateral 02/08/2016  . Postoperative abdominal pain 11/24/2014  . Status post laparoscopic assisted vaginal hysterectomy (LAVH) 11/23/2014  . Pelvic pain in female 10/06/2014  . Endometriosis determined by laparoscopy 10/06/2014    Past Surgical History:  Procedure Laterality Date  . ABDOMINAL HYSTERECTOMY    . endometrosis  2014   [facility]geration-[facility]  . KNEE SURGERY Bilateral    x2  . LAPAROSCOPY N/A 11/26/2014   Procedure: DIAGNOSTIC LAPAROSCOPY;  Surgeon: [name], MD;  Location: [facility];  Service: Gynecology;   Laterality: N/A;     OB History    Gravida  2   Para  2   Term  2   Preterm      AB      Living        SAB      TAB      Ectopic      Multiple      Live Births               Home Medications    Prior to Admission medications   Medication Sig Start Date End Date Taking? Authorizing Provider  ALPRAZolam Prudy Feeler) 0.25 MG tablet Take 0.25 mg by mouth daily as needed for anxiety.     [provider]  amphetamine-dextroamphetamine (ADDERALL) 20 MG tablet Take 20 mg by mouth 2 (two) times daily.    [provider]  cyclobenzaprine (FLEXERIL) 5 MG tablet Take 5 mg by mouth at bedtime.    [provider]  fludrocortisone (FLORINEF) 0.1 MG tablet Take 300 mcg by mouth daily. 10/29/17   [provider]  fluticasone (FLOVENT HFA) 110 MCG/ACT inhaler Flovent HFA 110 mcg/actuation aerosol inhaler  Inhale 1 puff twice a day by inhalation route.    [provider]  gabapentin (NEURONTIN) 600 MG tablet Take 600-1,800 mg by mouth 3 (three) times daily. Take 600 mg at 1600, take 600 mg at 2200 and 1800 at 0400.  [provider]  ibuprofen (ADVIL,MOTRIN) 600 MG tablet Take 600 mg by mouth every 6 (six) hours as needed for moderate pain.     [provider]  meloxicam (MOBIC) 15 MG tablet Take 15 mg by mouth daily as needed for pain.  06/26/17   [provider]  norgestimate-ethinyl estradiol (ORTHO-CYCLEN,SPRINTEC,PREVIFEM) 0.25-35 MG-MCG tablet Take 1 tablet by mouth daily. 03/21/16   [name], MD  norgestimate-ethinyl estradiol (ORTHO-CYCLEN,SPRINTEC,PREVIFEM) 0.25-35 MG-MCG tablet Take 1 tablet by mouth daily. 03/19/16   [name], MD  pantoprazole (PROTONIX) 40 MG tablet Take 40 mg by mouth daily.    [provider]  potassium chloride SA (K-DUR,KLOR-CON) 20 MEQ tablet Take one (1) tablet bid x 3 days, then take one tablet a day 11/30/17   Cathren Laine, MD  VIORELE 0.15-0.02/0.01 MG (21/5)  tablet Take 1 tablet by mouth daily. 08/30/17   [provider]    Family History Family History  Problem Relation Age of Onset  . Cancer Father        prostate  . Other Sister        IBS  . Other Brother        Lyme Disease  . Dementia Maternal Grandmother   . Parkinson's disease Maternal Grandfather   . Thrombocytopenia Sister     Social History Social History   Tobacco Use  . Smoking status: Current Every Day Smoker    Packs/day: 0.50    Types: Cigarettes, E-cigarettes  . Smokeless tobacco: Never Used  Substance Use Topics  . Alcohol use: Yes    Comment: occ  . Drug use: No     Allergies   Cymbalta [duloxetine hcl]; Sulfa antibiotics; Trazodone and nefazodone; and Sudafed [pseudoephedrine hcl]   Review of Systems Review of Systems  All other systems reviewed and are negative.    Physical Exam Updated Vital Signs BP (!) 157/96 (BP Location: Right Arm)   Pulse 80   Temp 98.2 F (36.8 C) (Oral)   Resp 14   Ht  (1.753 m)   Wt 65.8 kg   LMP 09/19/2014 Comment: post LAVH  SpO2 98%   BMI 21.41 kg/m   Physical Exam Vitals signs and nursing note reviewed.  Constitutional:      General: She is not in acute distress.    Appearance: She is well-developed. She is not ill-appearing, toxic-appearing or diaphoretic.  HENT:     Head: Normocephalic and atraumatic.     Right Ear: External ear normal.     Left Ear: External ear normal.     Nose: No congestion or rhinorrhea.     Mouth/Throat:     Pharynx: No oropharyngeal exudate or posterior oropharyngeal erythema.  Eyes:     Conjunctiva/sclera: Conjunctivae normal.     Pupils: Pupils are equal, round, and reactive to light.  Neck:     Musculoskeletal: Normal range of motion and neck supple.     Trachea: Phonation normal.  Cardiovascular:     Rate and Rhythm: Normal rate and regular rhythm.     Heart sounds: Normal heart sounds.  Pulmonary:     Effort: Pulmonary effort is normal.     Breath  sounds: Normal breath sounds.  Abdominal:     General: There is no distension.     Palpations: Abdomen is soft. There is no mass.     Tenderness: There is no abdominal tenderness. There is no guarding or rebound.     Hernia: No hernia is  present.     Comments: No palpable tenderness in the abdomen or pelvis.  Musculoskeletal: Normal range of motion.  Skin:    General: Skin is warm and dry.  Neurological:     Mental Status: She is alert and oriented to person, place, and time.     Cranial Nerves: No cranial nerve deficit.     Sensory: No sensory deficit.     Motor: No abnormal muscle tone.     Coordination: Coordination normal.  Psychiatric:        Mood and Affect: Mood normal.        Behavior: Behavior normal.        Thought Content: Thought content normal.        Judgment: Judgment normal.      ED Treatments / Results  Labs (all labs ordered are listed, but only abnormal results are displayed) Labs Reviewed - No data to display  EKG None  Radiology No results found.  Procedures Procedures (including critical care time)  Medications Ordered in ED Medications - No data to display   Initial Impression / Assessment and Plan / ED Course  I have reviewed the triage vital signs and the nursing notes.  Pertinent labs & imaging results that were available during my care of the patient were reviewed by me and considered in my medical decision making (see chart for details).         Patient Vitals for the past 24 hrs:  BP Temp Temp src Pulse Resp SpO2 Height Weight  07/25/18 1842 (!) 157/96 98.2 F (36.8 C) Oral 80 14 98 % - -  07/25/18 1839 - - - - - - 5\' 9"  (1.753 m) 65.8 kg    Evaluation possibly consistent with ovarian torsion, urgent ultrasound ordered.  Medical Decision Making: Pelvic pain.  She is status post hysterectomy and removal of right ovary.  Concern for torsion of her remaining ovary.  Imaging ordered to evaluate in the ED, urgently.  CRITICAL  CARE-no Performed by: Mancel BaleElliott Katriona Schmierer  Nursing Notes Reviewed/ Care Coordinated Applicable Imaging Reviewed Interpretation of Laboratory Data incorporated into ED treatment   Disposition per oncoming provider team following imaging.  Final Clinical Impressions(s) / ED Diagnoses   Final diagnoses:  Pelvic pain    ED Discharge Orders    None       Mancel BaleWentz, Aldonia Keeven, MD 07/25/18 1945

## 2018-07-26 DIAGNOSIS — R102 Pelvic and perineal pain: Secondary | ICD-10-CM

## 2018-07-26 MED ORDER — NORGESTIMATE-ETH ESTRADIOL 0.25-35 MG-MCG PO TABS: 1.0000 | ORAL_TABLET | Freq: Three times a day (TID) | ORAL | 0 refills | Status: DC

## 2018-07-26 MED ORDER — OXYCODONE-ACETAMINOPHEN 5-325 MG PO TABS: 1.0000 | ORAL_TABLET | ORAL | 0 refills | Status: DC | PRN

## 2018-07-26 MED ORDER — KETOROLAC TROMETHAMINE 10 MG PO TABS: 10.0000 mg | ORAL_TABLET | Freq: Four times a day (QID) | ORAL | 0 refills | Status: DC

## 2018-07-26 MED ORDER — KETOROLAC TROMETHAMINE 30 MG/ML IJ SOLN
15.0000 mg | Freq: Once | INTRAMUSCULAR | Status: AC
  Administered 2018-07-26: 15 mg via INTRAVENOUS
  Filled 2018-07-26: qty 1

## 2018-07-26 NOTE — Final Consult Note (Addendum)
Pt now receiving IV K+ after toradol IV, and finds the K+ infusion more tender than the cyst.  PEX:No guarding or rebound at this time.  Pt adequately comfortable for outpt management, so upon completion of potassium infusion, will d/c home, on Percocet po, toradol po, and ocp's.  Followup in 10-14 days [facility] via televisit, or earlier PRN pain exacerbation.

## 2018-07-26 NOTE — Discharge Summary (Signed)
Discharged after K+ infusion for hypoKalemia, and pain control adequate by toradol and percocet. Will followup 12-14 days via phone at [facility], or earlier prn.

## 2018-07-26 NOTE — Discharge Instructions (Signed)
Ovarian Cyst         An ovarian cyst is a fluid-filled sac that forms on an ovary. The ovaries are small organs that produce eggs in women. Various types of cysts can form on the ovaries. Some may cause symptoms and require treatment. Most ovarian cysts go away on their own, are not cancerous (are benign), and do not cause problems.  Common types of ovarian cysts include:  · Functional (follicle) cysts.  ? Occur during the menstrual cycle, and usually go away with the next menstrual cycle if you do not get pregnant.  ? Usually cause no symptoms.  · Endometriomas.  ? Are cysts that form from the tissue that lines the uterus (endometrium).  ? Are sometimes called “chocolate cysts” because they become filled with blood that turns brown.  ? Can cause pain in the lower abdomen during intercourse and during your period.  · Cystadenoma cysts.  ? Develop from cells on the outside surface of the ovary.  ? Can get very large and cause lower abdomen pain and pain with intercourse.  ? Can cause severe pain if they twist or break open (rupture).  · Dermoid cysts.  ? Are sometimes found in both ovaries.  ? May contain different kinds of body tissue, such as skin, teeth, hair, or cartilage.  ? Usually do not cause symptoms unless they get very big.  · Theca lutein cysts.  ? Occur when too much of a certain hormone (human chorionic gonadotropin) is produced and overstimulates the ovaries to produce an egg.  ? Are most common after having procedures used to assist with the conception of a baby (in vitro fertilization).  What are the causes?  Ovarian cysts may be caused by:  · Ovarian hyperstimulation syndrome. This is a condition that can develop from taking fertility medicines. It causes multiple large ovarian cysts to form.  · Polycystic ovarian syndrome (PCOS). This is a common hormonal disorder that can cause ovarian cysts, as well as problems with your period or fertility.  What increases the risk?  The following factors may  make you more likely to develop ovarian cysts:  · Being overweight or obese.  · Taking fertility medicines.  · Taking certain forms of hormonal birth control.  · Smoking.  What are the signs or symptoms?  Many ovarian cysts do not cause symptoms. If symptoms are present, they may include:  · Pelvic pain or pressure.  · Pain in the lower abdomen.  · Pain during sex.  · Abdominal swelling.  · Abnormal menstrual periods.  · Increasing pain with menstrual periods.  How is this diagnosed?  These cysts are commonly found during a routine pelvic exam. You may have tests to find out more about the cyst, such as:  · Ultrasound.  · X-ray of the pelvis.  · CT scan.  · MRI.  · Blood tests.  How is this treated?  Many ovarian cysts go away on their own without treatment. Your health care provider may want to check your cyst regularly for 2-3 months to see if it changes. If you are in menopause, it is especially important to have your cyst monitored closely because menopausal women have a higher rate of ovarian cancer.  When treatment is needed, it may include:  · Medicines to help relieve pain.  · A procedure to drain the cyst (aspiration).  · Surgery to remove the whole cyst.  · Hormone treatment or birth control pills. These methods are sometimes used   to help dissolve a cyst.  Follow these instructions at home:  · Take over-the-counter and prescription medicines only as told by your health care provider.  · Do not drive or use heavy machinery while taking prescription pain medicine.  · Get regular pelvic exams and Pap tests as often as told by your health care provider.  · Return to your normal activities as told by your health care provider. Ask your health care provider what activities are safe for you.  · Do not use any products that contain nicotine or tobacco, such as cigarettes and e-cigarettes. If you need help quitting, ask your health care provider.  · Keep all follow-up visits as told by your health care provider.  This is important.  Contact a health care provider if:  · Your periods are late, irregular, or painful, or they stop.  · You have pelvic pain that does not go away.  · You have pressure on your bladder or trouble emptying your bladder completely.  · You have pain during sex.  · You have any of the following in your abdomen:  ? A feeling of fullness.  ? Pressure.  ? Discomfort.  ? Pain that does not go away.  ? Swelling.  · You feel generally ill.  · You become constipated.  · You lose your appetite.  · You develop severe acne.  · You start to have more body hair and facial hair.  · You are gaining weight or losing weight without changing your exercise and eating habits.  · You think you may be pregnant.  Get help right away if:  · You have abdominal pain that is severe or gets worse.  · You cannot eat or drink without vomiting.  · You suddenly develop a fever.  · Your menstrual period is much heavier than usual.  This information is not intended to replace advice given to you by your health care provider. Make sure you discuss any questions you have with your health care provider.  Document Released: 02/12/2005 Document Revised: 09/02/2015 Document Reviewed: 07/17/2015  Elsevier Interactive Patient Education © 2019 [employer]

## 2018-07-29 ENCOUNTER — Telehealth: Payer: Self-pay | Admitting: Obstetrics and Gynecology

## 2018-07-29 MED ORDER — ONDANSETRON HCL 8 MG PO TABS: 8.0000 mg | ORAL_TABLET | Freq: Three times a day (TID) | ORAL | 1 refills | Status: DC | PRN

## 2018-07-29 NOTE — Telephone Encounter (Signed)
will rx zofran

## 2018-07-29 NOTE — Telephone Encounter (Signed)
Pt states Dr. Emelda Fear as her taking birth control 3 times daily to shrink a ovarian cyst but taking the birth control so often is making her sick. She would like to see if nausea medication can be sent in for her.

## 2018-07-29 NOTE — Telephone Encounter (Signed)
Spoke with pt. Pt was in ER Friday night with ovarian cyst. Dr. Emelda Fear put her on birth control TID to shrink a cyst. Birth control is causing nausea. Pt is requesting Zofran or something to help with nausea. Please advise. Thanks!! JSY

## 2018-07-30 DIAGNOSIS — F4310 Post-traumatic stress disorder, unspecified: Secondary | ICD-10-CM | POA: Insufficient documentation

## 2018-07-30 DIAGNOSIS — Z79899 Other long term (current) drug therapy: Secondary | ICD-10-CM | POA: Insufficient documentation

## 2018-07-30 DIAGNOSIS — N83202 Unspecified ovarian cyst, left side: Principal | ICD-10-CM | POA: Insufficient documentation

## 2018-07-30 DIAGNOSIS — R102 Pelvic and perineal pain: Secondary | ICD-10-CM | POA: Insufficient documentation

## 2018-07-30 DIAGNOSIS — N8302 Follicular cyst of left ovary: Secondary | ICD-10-CM | POA: Insufficient documentation

## 2018-07-30 DIAGNOSIS — F17210 Nicotine dependence, cigarettes, uncomplicated: Secondary | ICD-10-CM | POA: Insufficient documentation

## 2018-07-30 DIAGNOSIS — Z882 Allergy status to sulfonamides status: Secondary | ICD-10-CM | POA: Insufficient documentation

## 2018-07-30 DIAGNOSIS — Z1159 Encounter for screening for other viral diseases: Secondary | ICD-10-CM | POA: Insufficient documentation

## 2018-07-30 DIAGNOSIS — Z888 Allergy status to other drugs, medicaments and biological substances status: Secondary | ICD-10-CM | POA: Insufficient documentation

## 2018-07-30 DIAGNOSIS — E876 Hypokalemia: Secondary | ICD-10-CM | POA: Insufficient documentation

## 2018-07-30 DIAGNOSIS — K219 Gastro-esophageal reflux disease without esophagitis: Secondary | ICD-10-CM | POA: Insufficient documentation

## 2018-07-31 ENCOUNTER — Observation Stay (HOSPITAL_COMMUNITY): Payer: Self-pay | Admitting: Anesthesiology

## 2018-07-31 ENCOUNTER — Encounter (HOSPITAL_COMMUNITY)
Admission: EM | Disposition: A | Discharge status: Home / Self Care | Payer: Self-pay | Source: Home / Self Care | Attending: Emergency Medicine

## 2018-07-31 ENCOUNTER — Emergency Department (HOSPITAL_COMMUNITY): Payer: Self-pay

## 2018-07-31 ENCOUNTER — Observation Stay (HOSPITAL_COMMUNITY)
Admission: EM | Admit: 2018-07-31 | Discharge: 2018-07-31 | Disposition: A | Discharge status: Home / Self Care | Payer: Self-pay | Attending: Obstetrics & Gynecology | Admitting: Obstetrics & Gynecology

## 2018-07-31 ENCOUNTER — Encounter (HOSPITAL_COMMUNITY): Payer: Self-pay | Admitting: *Deleted

## 2018-07-31 ENCOUNTER — Other Ambulatory Visit: Payer: Self-pay

## 2018-07-31 DIAGNOSIS — N83202 Unspecified ovarian cyst, left side: Secondary | ICD-10-CM | POA: Diagnosis present

## 2018-07-31 DIAGNOSIS — R102 Pelvic and perineal pain: Secondary | ICD-10-CM

## 2018-07-31 DIAGNOSIS — E876 Hypokalemia: Secondary | ICD-10-CM

## 2018-07-31 HISTORY — PX: LAPAROSCOPIC BILATERAL SALPINGO OOPHERECTOMY: SHX5890

## 2018-07-31 LAB — LIPASE, BLOOD: Lipase: 33 U/L (ref 11–51)

## 2018-07-31 LAB — CBC WITH DIFFERENTIAL/PLATELET
Abs Immature Granulocytes: 0.03 10*3/uL (ref 0.00–0.07)
Basophils Absolute: 0 10*3/uL (ref 0.0–0.1)
Basophils Relative: 0 %
Eosinophils Absolute: 0 10*3/uL (ref 0.0–0.5)
Eosinophils Relative: 0 %
HCT: 33.9 % — ABNORMAL LOW (ref 36.0–46.0)
Hemoglobin: 11.8 g/dL — ABNORMAL LOW (ref 12.0–15.0)
Immature Granulocytes: 0 %
Lymphocytes Relative: 16 %
Lymphs Abs: 1.5 10*3/uL (ref 0.7–4.0)
MCH: 32.4 pg (ref 26.0–34.0)
MCHC: 34.8 g/dL (ref 30.0–36.0)
MCV: 93.1 fL (ref 80.0–100.0)
Monocytes Absolute: 0.5 10*3/uL (ref 0.1–1.0)
Monocytes Relative: 5 %
Neutro Abs: 7 10*3/uL (ref 1.7–7.7)
Neutrophils Relative %: 79 %
Platelets: 192 10*3/uL (ref 150–400)
RBC: 3.64 MIL/uL — ABNORMAL LOW (ref 3.87–5.11)
RDW: 11.7 % (ref 11.5–15.5)
WBC: 9 10*3/uL (ref 4.0–10.5)
nRBC: 0 % (ref 0.0–0.2)

## 2018-07-31 LAB — POTASSIUM: Potassium: 2.9 mmol/L — ABNORMAL LOW (ref 3.5–5.1)

## 2018-07-31 LAB — SARS CORONAVIRUS 2 BY RT PCR (HOSPITAL ORDER, PERFORMED IN ~~LOC~~ HOSPITAL LAB): SARS Coronavirus 2: NEGATIVE

## 2018-07-31 LAB — URINALYSIS, ROUTINE W REFLEX MICROSCOPIC
Bilirubin Urine: NEGATIVE
Glucose, UA: NEGATIVE mg/dL
Ketones, ur: NEGATIVE mg/dL
Leukocytes,Ua: NEGATIVE
Nitrite: NEGATIVE
Protein, ur: NEGATIVE mg/dL
Specific Gravity, Urine: 1.002 — ABNORMAL LOW (ref 1.005–1.030)
pH: 8 (ref 5.0–8.0)

## 2018-07-31 LAB — COMPREHENSIVE METABOLIC PANEL
ALT: 23 U/L (ref 0–44)
AST: 23 U/L (ref 15–41)
Albumin: 3.5 g/dL (ref 3.5–5.0)
Alkaline Phosphatase: 49 U/L (ref 38–126)
Anion gap: 11 (ref 5–15)
BUN: 10 mg/dL (ref 6–20)
CO2: 26 mmol/L (ref 22–32)
Calcium: 8.5 mg/dL — ABNORMAL LOW (ref 8.9–10.3)
Chloride: 101 mmol/L (ref 98–111)
Creatinine, Ser: 0.93 mg/dL (ref 0.44–1.00)
GFR calc Af Amer: >60 mL/min (ref >60)
GFR calc non Af Amer: >60 mL/min (ref >60)
Glucose, Bld: 99 mg/dL (ref 70–99)
Potassium: 2.8 mmol/L — ABNORMAL LOW (ref 3.5–5.1)
Sodium: 138 mmol/L (ref 135–145)
Total Bilirubin: 0.4 mg/dL (ref 0.3–1.2)
Total Protein: 6.8 g/dL (ref 6.5–8.1)

## 2018-07-31 LAB — MAGNESIUM: Magnesium: 2 mg/dL (ref 1.7–2.4)

## 2018-07-31 LAB — HCG, SERUM, QUALITATIVE: Preg, Serum: NEGATIVE

## 2018-07-31 IMAGING — CT CT RENAL STONE PROTOCOL
2 of 4 series · 16 of 46 positions shown, 18 images · non-contrast
Comparison: CT dated [DATE].

CLINICAL DATA: Worsening abdominal pain.

EXAM:
CT ABDOMEN AND PELVIS WITHOUT CONTRAST
TECHNIQUE: Multidetector CT imaging of the abdomen and pelvis was performed
following the standard protocol without IV contrast.

[Series 2: axial st · axial · 0.79mm/px · z∈[-446,-71]mm · 13 of 86 slices shown, 15 images]
[im 6/86  soft-tissue]
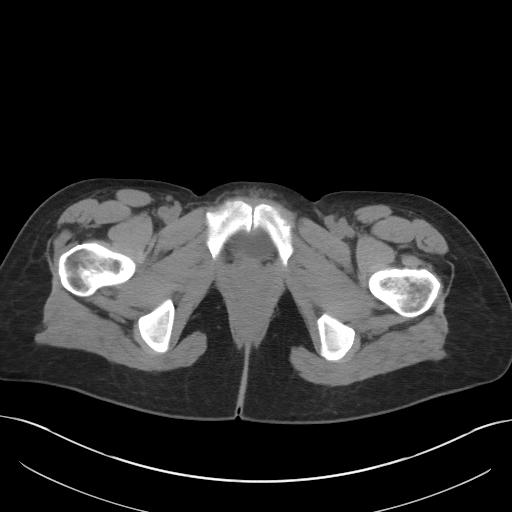
[im 6/86  bone]
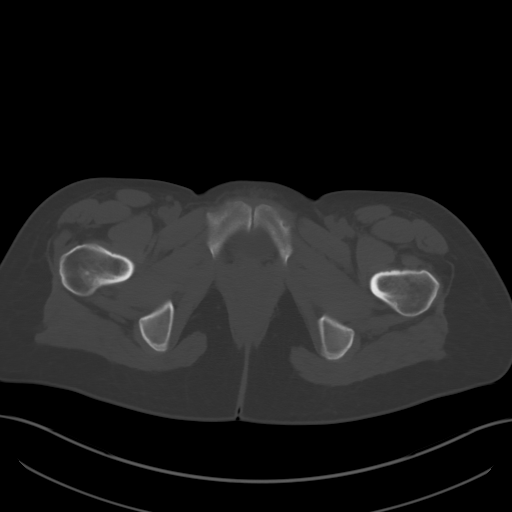
[im 11/86  soft-tissue]
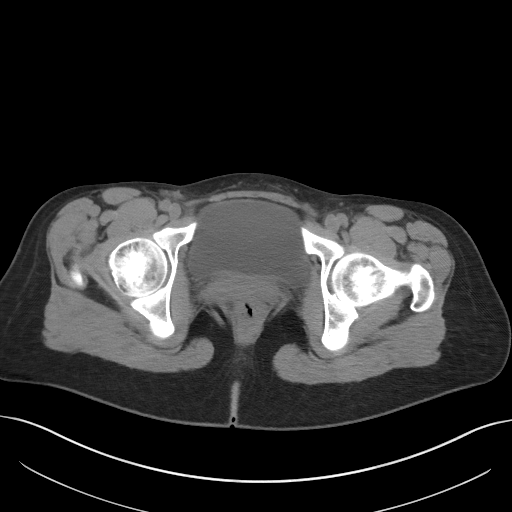
[im 21/86  soft-tissue]
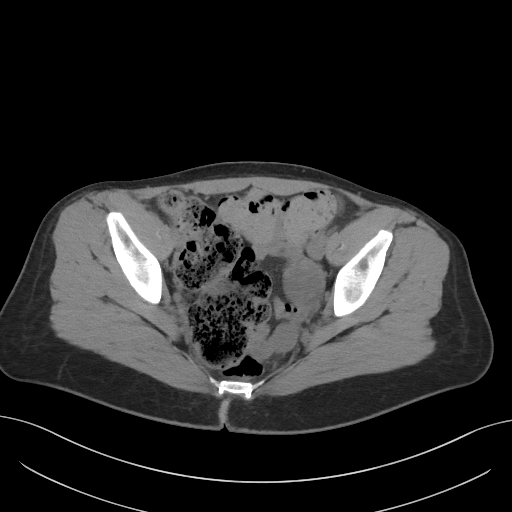
[im 26/86  soft-tissue]
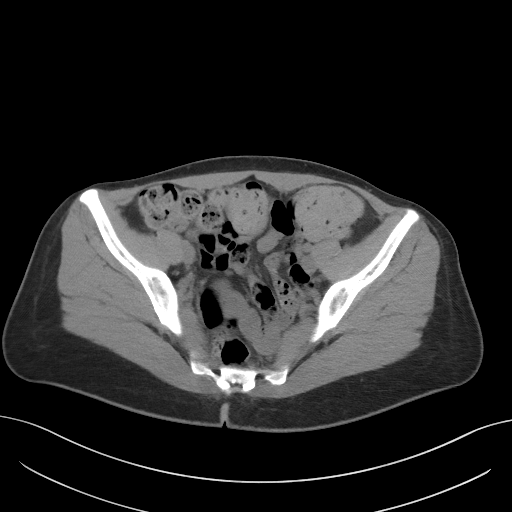
[im 31/86  soft-tissue]
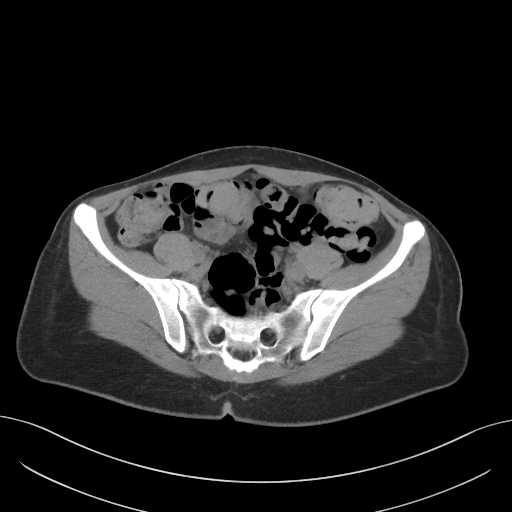
[im 36/86  soft-tissue]
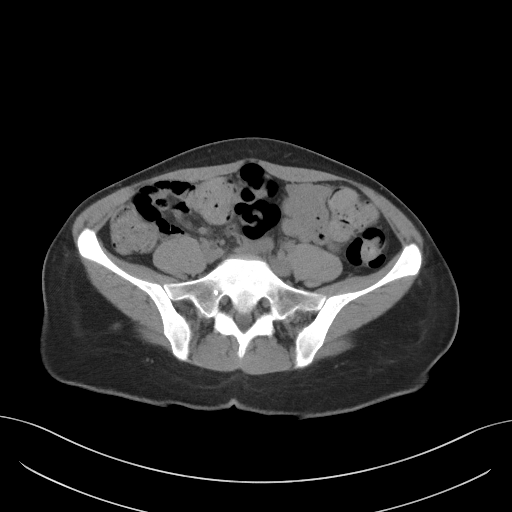
[im 46/86  soft-tissue]
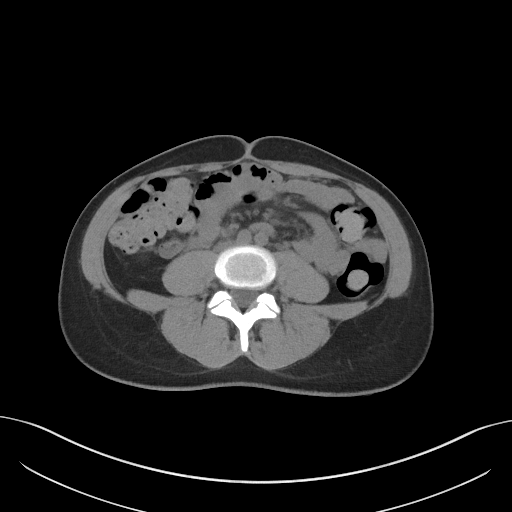
[im 51/86  soft-tissue]
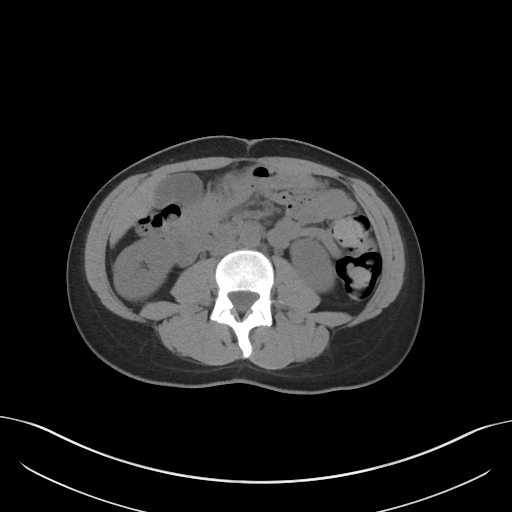
[im 56/86  soft-tissue]
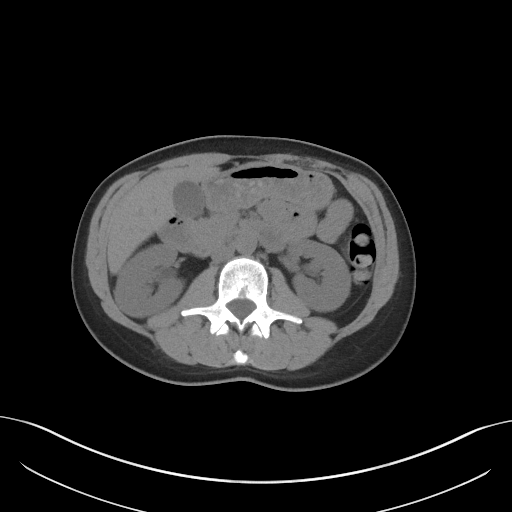
[im 56/86  bone]
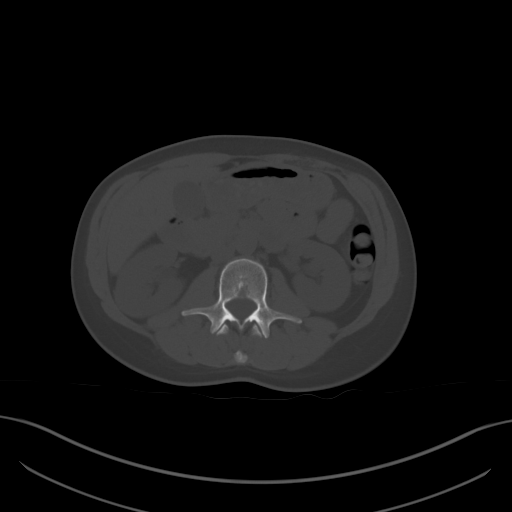
[im 61/86  soft-tissue]
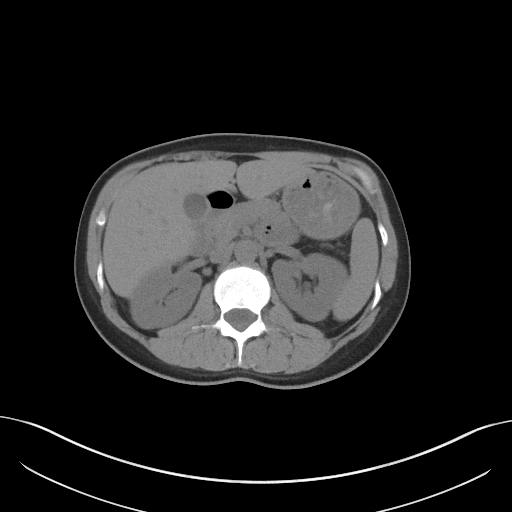
[im 66/86  soft-tissue]
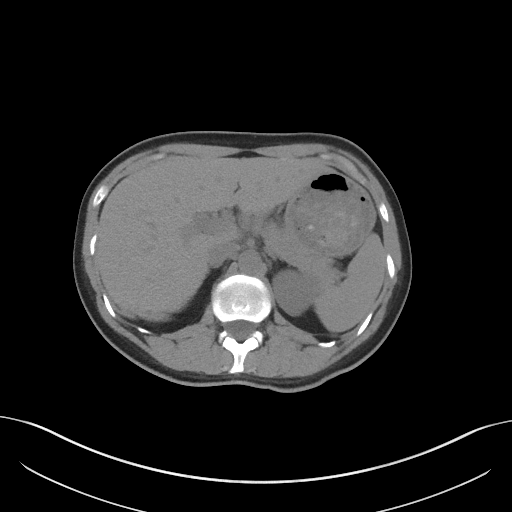
[im 76/86  soft-tissue]
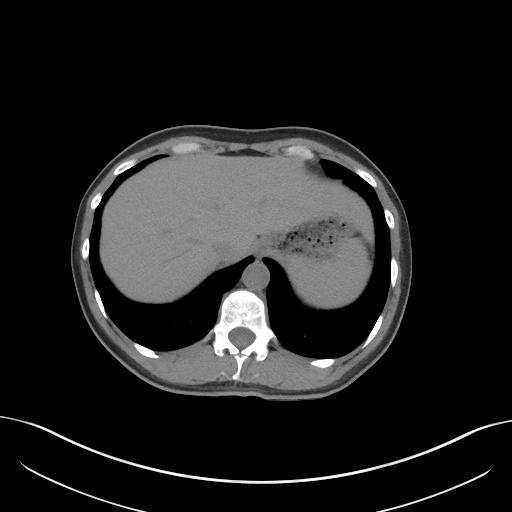
[im 81/86  soft-tissue]
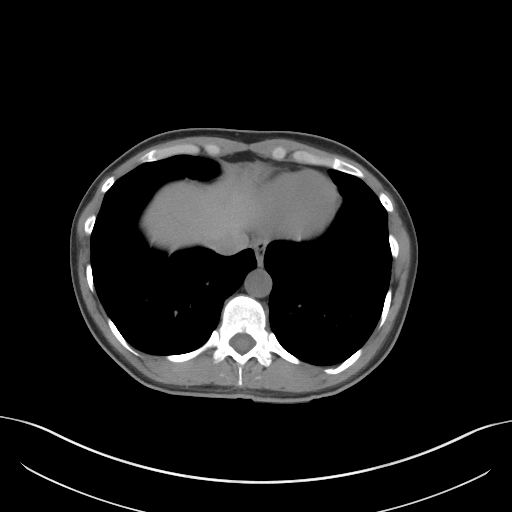

[Series 5: coronal st · coronal · 0.72mm/px · 3 of 79 slices shown]
[im 27/79  soft-tissue]
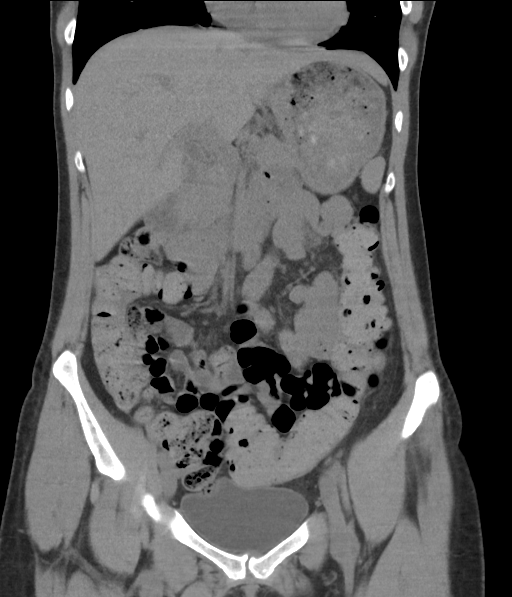
[im 35/79  soft-tissue]
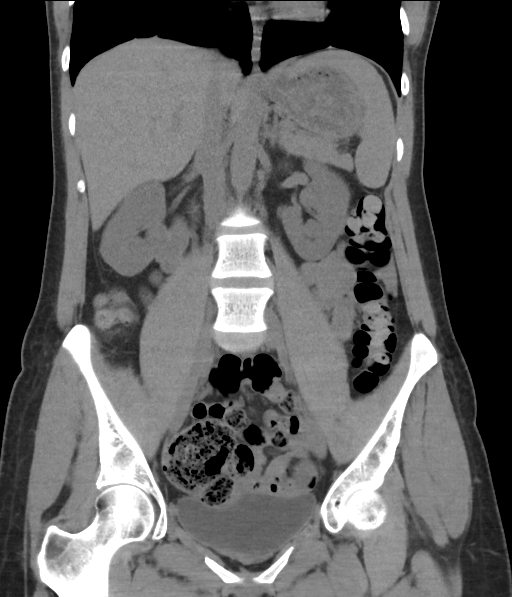
[im 44/79  soft-tissue]
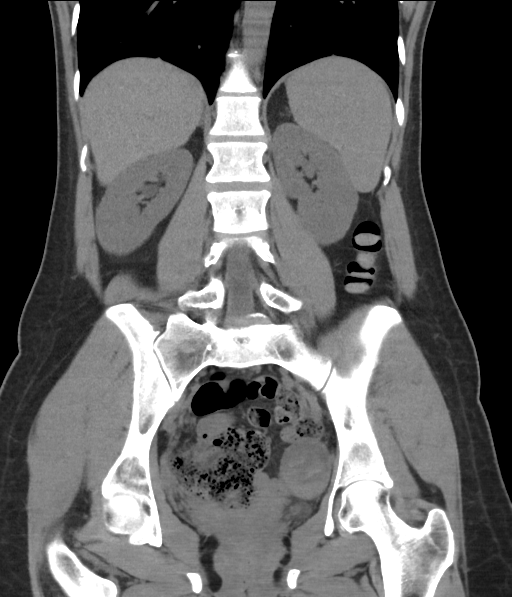

[16 of 46 positions shown; findings below may reference images not displayed]

FINDINGS: Lower chest: No acute abnormality.

Hepatobiliary: No focal liver abnormality is seen. No gallstones,
gallbladder wall thickening, or biliary dilatation.

Pancreas: Unremarkable. No pancreatic ductal dilatation or
surrounding inflammatory changes.

Spleen: Normal in size without focal abnormality.

Adrenals/Urinary Tract: Adrenal glands are unremarkable. Kidneys are
normal, without renal calculi, focal lesion, or hydronephrosis.
Bladder is unremarkable.

Stomach/Bowel: There is a large amount of stool in the colon,
especially the rectum. There is no evidence of a small-bowel
obstruction. The appendix is not reliably identified on this exam.

Vascular/Lymphatic: No significant vascular findings are present. No
enlarged abdominal or pelvic lymph nodes.

Reproductive: There is a complex 3.9 x 4.5 cm left adnexal cystic
structure. The patient is status post prior hysterectomy. The right
ovary is not well visualized.

Other: There is no significant free fluid in the pelvis.

Musculoskeletal: No acute or significant osseous findings.
IMPRESSION: 1. No definite acute intra-abdominal abnormality detected.
2. Interval increase in size of a complex left adnexal cystic
structure currently measuring approximately 3.9 x 4.5 cm. This is
favored to represent a hemorrhagic cyst. If there is high clinical
suspicion for ovarian torsion, follow-up with ultrasound is
recommended.
3. Large amount of stool throughout the colon, especially the cecum.
4. Status post hysterectomy.
5. The appendix is not reliably identified, however there are no
significant inflammatory changes in the right lower quadrant or the
patient's pelvis.

## 2018-07-31 SURGERY — SALPINGO-OOPHORECTOMY, BILATERAL, LAPAROSCOPIC
Anesthesia: General | Site: Abdomen | Laterality: Left

## 2018-07-31 MED ORDER — MIDAZOLAM HCL 2 MG/2ML IJ SOLN
INTRAMUSCULAR | Status: DC | PRN
  Administered 2018-07-31: 2 mg via INTRAVENOUS

## 2018-07-31 MED ORDER — ONDANSETRON HCL 4 MG/2ML IJ SOLN
INTRAMUSCULAR | Status: DC | PRN
  Administered 2018-07-31: 4 mg via INTRAVENOUS

## 2018-07-31 MED ORDER — KETOROLAC TROMETHAMINE 10 MG PO TABS: 10.0000 mg | ORAL_TABLET | Freq: Three times a day (TID) | ORAL | 0 refills | Status: DC | PRN

## 2018-07-31 MED ORDER — ONDANSETRON HCL 4 MG/2ML IJ SOLN: 4.0000 mg | Freq: Three times a day (TID) | INTRAMUSCULAR | Status: DC | PRN

## 2018-07-31 MED ORDER — SUCCINYLCHOLINE CHLORIDE 200 MG/10ML IV SOSY
PREFILLED_SYRINGE | INTRAVENOUS | Status: AC
  Filled 2018-07-31: qty 10

## 2018-07-31 MED ORDER — SUFENTANIL CITRATE 50 MCG/ML IV SOLN
INTRAVENOUS | Status: DC | PRN
  Administered 2018-07-31 (×2): 10 ug via INTRAVENOUS

## 2018-07-31 MED ORDER — GLYCOPYRROLATE 0.2 MG/ML IJ SOLN
INTRAMUSCULAR | Status: DC | PRN
  Administered 2018-07-31: 0.2 mg via INTRAVENOUS

## 2018-07-31 MED ORDER — OXYCODONE-ACETAMINOPHEN 5-325 MG PO TABS: 1.0000 | ORAL_TABLET | ORAL | 0 refills | Status: DC | PRN

## 2018-07-31 MED ORDER — HYDROMORPHONE HCL 1 MG/ML IJ SOLN
1.0000 mg | Freq: Once | INTRAMUSCULAR | Status: AC
  Administered 2018-07-31: 1 mg via INTRAVENOUS
  Filled 2018-07-31: qty 1

## 2018-07-31 MED ORDER — LACTATED RINGERS IV SOLN
INTRAVENOUS | Status: DC
  Administered 2018-07-31 (×2): via INTRAVENOUS

## 2018-07-31 MED ORDER — ONDANSETRON HCL 4 MG/2ML IJ SOLN
INTRAMUSCULAR | Status: AC
  Filled 2018-07-31: qty 2

## 2018-07-31 MED ORDER — LACTATED RINGERS IV SOLN: INTRAVENOUS | Status: DC

## 2018-07-31 MED ORDER — GLYCOPYRROLATE PF 0.2 MG/ML IJ SOSY
PREFILLED_SYRINGE | INTRAMUSCULAR | Status: AC
  Filled 2018-07-31: qty 1

## 2018-07-31 MED ORDER — SEVOFLURANE IN SOLN
RESPIRATORY_TRACT | Status: AC
  Filled 2018-07-31: qty 250

## 2018-07-31 MED ORDER — HYDROMORPHONE HCL 1 MG/ML IJ SOLN: 1.0000 mg | INTRAMUSCULAR | Status: DC | PRN

## 2018-07-31 MED ORDER — BUPIVACAINE LIPOSOME 1.3 % IJ SUSP
INTRAMUSCULAR | Status: AC
  Filled 2018-07-31: qty 20

## 2018-07-31 MED ORDER — PROPOFOL 10 MG/ML IV BOLUS
INTRAVENOUS | Status: DC | PRN
  Administered 2018-07-31: 150 mg via INTRAVENOUS

## 2018-07-31 MED ORDER — SODIUM CHLORIDE 0.9 % IV BOLUS
1000.0000 mL | Freq: Once | INTRAVENOUS | Status: AC
  Administered 2018-07-31: 1000 mL via INTRAVENOUS

## 2018-07-31 MED ORDER — ROCURONIUM BROMIDE 100 MG/10ML IV SOLN
INTRAVENOUS | Status: DC | PRN
  Administered 2018-07-31: 40 mg via INTRAVENOUS

## 2018-07-31 MED ORDER — SODIUM CHLORIDE 0.9 % IR SOLN
Status: DC | PRN
  Administered 2018-07-31: 1000 mL

## 2018-07-31 MED ORDER — POTASSIUM CHLORIDE 10 MEQ/100ML IV SOLN
10.0000 meq | INTRAVENOUS | Status: AC
  Administered 2018-07-31 (×3): 10 meq via INTRAVENOUS
  Filled 2018-07-31 (×3): qty 100

## 2018-07-31 MED ORDER — METOCLOPRAMIDE HCL 5 MG/ML IJ SOLN
10.0000 mg | Freq: Once | INTRAMUSCULAR | Status: AC
  Administered 2018-07-31: 06:00:00 10 mg via INTRAVENOUS
  Filled 2018-07-31: qty 2

## 2018-07-31 MED ORDER — MIDAZOLAM HCL 2 MG/2ML IJ SOLN
INTRAMUSCULAR | Status: AC
  Filled 2018-07-31: qty 2

## 2018-07-31 MED ORDER — PROMETHAZINE HCL 25 MG/ML IJ SOLN: 6.2500 mg | INTRAMUSCULAR | Status: DC | PRN

## 2018-07-31 MED ORDER — DEXAMETHASONE SODIUM PHOSPHATE 10 MG/ML IJ SOLN
INTRAMUSCULAR | Status: AC
  Filled 2018-07-31: qty 1

## 2018-07-31 MED ORDER — MEPERIDINE HCL 50 MG/ML IJ SOLN: 6.2500 mg | INTRAMUSCULAR | Status: DC | PRN

## 2018-07-31 MED ORDER — HYDROCODONE-ACETAMINOPHEN 7.5-325 MG PO TABS
1.0000 | ORAL_TABLET | Freq: Once | ORAL | Status: AC | PRN
  Administered 2018-07-31: 1 via ORAL
  Filled 2018-07-31: qty 1

## 2018-07-31 MED ORDER — DEXAMETHASONE SODIUM PHOSPHATE 10 MG/ML IJ SOLN
INTRAMUSCULAR | Status: DC | PRN
  Administered 2018-07-31: 8 mg via INTRAVENOUS

## 2018-07-31 MED ORDER — BUPIVACAINE LIPOSOME 1.3 % IJ SUSP
INTRAMUSCULAR | Status: DC | PRN
  Administered 2018-07-31: 20 mL

## 2018-07-31 MED ORDER — SUFENTANIL CITRATE 50 MCG/ML IV SOLN
INTRAVENOUS | Status: AC
  Filled 2018-07-31: qty 1

## 2018-07-31 MED ORDER — HYDROMORPHONE HCL 1 MG/ML IJ SOLN
0.2500 mg | INTRAMUSCULAR | Status: DC | PRN
  Administered 2018-07-31: 0.5 mg via INTRAVENOUS
  Filled 2018-07-31: qty 0.5

## 2018-07-31 MED ORDER — KETOROLAC TROMETHAMINE 30 MG/ML IJ SOLN
30.0000 mg | Freq: Once | INTRAMUSCULAR | Status: AC
  Administered 2018-07-31: 30 mg via INTRAVENOUS
  Filled 2018-07-31: qty 1

## 2018-07-31 MED ORDER — CEFAZOLIN SODIUM-DEXTROSE 2-4 GM/100ML-% IV SOLN
2.0000 g | INTRAVENOUS | Status: AC
  Administered 2018-07-31: 2 g via INTRAVENOUS
  Filled 2018-07-31: qty 100

## 2018-07-31 MED ORDER — SUCCINYLCHOLINE CHLORIDE 20 MG/ML IJ SOLN
INTRAMUSCULAR | Status: DC | PRN
  Administered 2018-07-31: 120 mg via INTRAVENOUS

## 2018-07-31 MED ORDER — SODIUM CHLORIDE (PF) 0.9 % IJ SOLN
INTRAMUSCULAR | Status: AC
  Filled 2018-07-31: qty 10

## 2018-07-31 MED ORDER — SODIUM CHLORIDE 0.9 % IV SOLN: INTRAVENOUS | Status: DC

## 2018-07-31 MED ORDER — ONDANSETRON HCL 4 MG/2ML IJ SOLN
4.0000 mg | Freq: Once | INTRAMUSCULAR | Status: AC
  Administered 2018-07-31: 4 mg via INTRAVENOUS
  Filled 2018-07-31: qty 2

## 2018-07-31 MED ORDER — SUGAMMADEX SODIUM 500 MG/5ML IV SOLN
INTRAVENOUS | Status: DC | PRN
  Administered 2018-07-31: 200 mg via INTRAVENOUS

## 2018-07-31 MED ORDER — ROCURONIUM BROMIDE 10 MG/ML (PF) SYRINGE
PREFILLED_SYRINGE | INTRAVENOUS | Status: AC
  Filled 2018-07-31: qty 10

## 2018-07-31 SURGICAL SUPPLY — 51 items
APPLICATOR COTTON TIP 6 STRL (MISCELLANEOUS) ×2 IMPLANT
APPLICATOR COTTON TIP 6IN STRL (MISCELLANEOUS) ×6
BAG RETRIEVAL 10MM (BASKET) ×1
BLADE SURG SZ11 CARB STEEL (BLADE) ×3 IMPLANT
BRUSH SCRUB SURG 4.25 DISP (MISCELLANEOUS) ×3 IMPLANT
CLOTH BEACON ORANGE TIMEOUT ST (SAFETY) ×3 IMPLANT
COVER LIGHT HANDLE STERIS (MISCELLANEOUS) ×6 IMPLANT
COVER WAND RF STERILE (DRAPES) ×3 IMPLANT
DERMABOND ADVANCED (GAUZE/BANDAGES/DRESSINGS) ×2
DERMABOND ADVANCED .7 DNX12 (GAUZE/BANDAGES/DRESSINGS) ×1 IMPLANT
ELECT REM PT RETURN 9FT ADLT (ELECTROSURGICAL) ×3
ELECTRODE REM PT RTRN 9FT ADLT (ELECTROSURGICAL) ×1 IMPLANT
FILTER SMOKE EVAC LAPAROSHD (FILTER) ×3 IMPLANT
FORMALIN 10 PREFIL 120ML (MISCELLANEOUS) ×6 IMPLANT
GLOVE BIO SURGEON STRL SZ7 (GLOVE) ×3 IMPLANT
GLOVE BIOGEL PI IND STRL 7.0 (GLOVE) ×2 IMPLANT
GLOVE BIOGEL PI IND STRL 8 (GLOVE) ×1 IMPLANT
GLOVE BIOGEL PI INDICATOR 7.0 (GLOVE) ×4
GLOVE BIOGEL PI INDICATOR 8 (GLOVE) ×2
GLOVE ECLIPSE 8.0 STRL XLNG CF (GLOVE) ×3 IMPLANT
GOWN STRL REUS W/TWL LRG LVL3 (GOWN DISPOSABLE) ×3 IMPLANT
GOWN STRL REUS W/TWL XL LVL3 (GOWN DISPOSABLE) ×3 IMPLANT
INST SET LAPROSCOPIC GYN AP (KITS) ×3 IMPLANT
IV NS IRRIG 3000ML ARTHROMATIC (IV SOLUTION) ×3 IMPLANT
KIT TURNOVER KIT A (KITS) ×3 IMPLANT
MANIFOLD NEPTUNE II (INSTRUMENTS) ×3 IMPLANT
NEEDLE HYPO 18GX1.5 BLUNT FILL (NEEDLE) ×3 IMPLANT
NEEDLE HYPO 25X1 1.5 SAFETY (NEEDLE) ×3 IMPLANT
NEEDLE INSUFFLATION 120MM (ENDOMECHANICALS) ×3 IMPLANT
PACK PERI GYN (CUSTOM PROCEDURE TRAY) ×3 IMPLANT
PAD ARMBOARD 7.5X6 YLW CONV (MISCELLANEOUS) ×3 IMPLANT
SET BASIN LINEN APH (SET/KITS/TRAYS/PACK) ×3 IMPLANT
SET TUBE IRRIG SUCTION NO TIP (IRRIGATION / IRRIGATOR) ×3 IMPLANT
SHEARS HARMONIC ACE PLUS 36CM (ENDOMECHANICALS) ×3 IMPLANT
SLEEVE ENDOPATH XCEL 5M (ENDOMECHANICALS) ×3 IMPLANT
SOLUTION ANTI FOG 6CC (MISCELLANEOUS) ×3 IMPLANT
SPONGE GAUZE 2X2 8PLY STER LF (GAUZE/BANDAGES/DRESSINGS) ×3
SPONGE GAUZE 2X2 8PLY STRL LF (GAUZE/BANDAGES/DRESSINGS) ×6 IMPLANT
STAPLER VISISTAT 35W (STAPLE) ×3 IMPLANT
SUT VIC AB 3-0 SH 27 (SUTURE) ×2
SUT VIC AB 3-0 SH 27X BRD (SUTURE) ×1 IMPLANT
SUT VICRYL 0 UR6 27IN ABS (SUTURE) ×3 IMPLANT
SYR 20CC LL (SYRINGE) ×3 IMPLANT
SYR 30ML LL (SYRINGE) ×3 IMPLANT
SYR CONTROL 10ML LL (SYRINGE) ×3 IMPLANT
SYS BAG RETRIEVAL 10MM (BASKET) ×2
SYSTEM BAG RETRIEVAL 10MM (BASKET) ×1 IMPLANT
TRAY FOLEY CATH 16FR SILVER (SET/KITS/TRAYS/PACK) IMPLANT
TROCAR XCEL NON-BLD 5MMX100MML (ENDOMECHANICALS) ×3 IMPLANT
TUBING INSUF HEATED (TUBING) ×3 IMPLANT
WARMER LAPAROSCOPE (MISCELLANEOUS) ×3 IMPLANT

## 2018-07-31 NOTE — Op Note (Signed)
Preoperative diagnosis:  >4.6 cm left ovarian cyst with increasing left lower quadrant pain can no longer be managed as an outpatient > Probable intermittent torsion  Postoperative diagnosis:  Same as above  Procedure:  Laparoscopic left oophorectomy(there really was no tube present)  Anesthesia:  General endotracheal  Findings:  Patient had been seen approximately 1 week ago the emergency room with complaint of stabbing left lower quadrant pain evaluation revealed a 2-1/2 cm left ovarian cyst and she was to be managed as an outpatient The patient returned to the emergency department last night complaining of increasing pain Repeat scanning revealed a 4.6 cm hemorrhagic left ovarian cyst Based on her symptomatology there was concern for intermittent torsion and the patient had had right ovarian torsion previously which led to a right salpingo-oophorectomy She is status post a vaginal hysterectomy separately After discussion with the patient she desires to have definitive surgical management and as a result we are proceeding with a laparoscopic left salpingo-oophorectomy  Description of operation:  Patient was taken to the operating room where she was placed in the supine position She underwent general endotracheal anesthesia She was placed in low lithotomy position She was prepped and draped in the usual sterile fashion for a laparoscopic procedure An incision was made in the umbilicus and the rectus fascia was grasped with a Coker clamp in this area The Veres needle was then placed into the peritoneal cavity with 1 pass that difficulty and the peritoneal cavity was insufflated An 11 mm non-bladed video trocar was then placed into the peritoneal cavity under direct visualization with 1 attempt without difficulty The peritoneal cavity was confirmed Small incisions were made in the right and left lower quadrants and 5 mm non-bladed trochars were placed under direct visualization  without difficulty The peritoneal cavity was normal and there were no adhesions of significance and no evidence of endometriosis  The left ovary was identified and isolated There indeed was an ovarian cyst present There was not the typical anatomic findings that she would normally see with ovarian torsion The infundibulopelvic ligament was identified The harmonic scalpel was used and the infundibulopelvic ligament was transected hemostatically The fallopian tube had been removed at the time of her vaginal hysterectomy and there was none present The ovary and cyst were then placed in an Endo Catch bag and removed intact from the peritoneal cavity There was good hemostasis of the infundibulopelvic ligament  There was no bleeding for the procedure  The instruments were removed and the gas was allowed to escape The umbilical fascia was closed with running 2-0 Vicryl suture and a urological needle All 3 skin incisions were closed in a subcuticular fashion using 3-0 Vicryl and Dermabond 10 cc of Exparel was injected in the umbilical incision and 5 cc of Exparel was injected in each lower quadrant incision for adjunct of postoperative pain management  The patient tolerated the procedure well As stated above she experienced no blood loss She was taken to the recovery room good stable condition all counts were correct x3 She will be seen in the office next week for postoperative evaluation  Lazaro Arms, MD 07/31/2018 11:41 AM

## 2018-07-31 NOTE — Anesthesia Postprocedure Evaluation (Signed)
Anesthesia Post Note  Patient: Rachel Griffith  Procedure(s) Performed: LAPAROSCOPIC LEFT SALPINGO OOPHORECTOMY (Left Abdomen)  Patient location during evaluation: PACU Anesthesia Type: General Level of consciousness: awake and alert and patient cooperative Pain management: satisfactory to patient Vital Signs Assessment: post-procedure vital signs reviewed and stable Respiratory status: spontaneous breathing Cardiovascular status: stable Postop Assessment: no apparent nausea or vomiting Anesthetic complications: no     Last Vitals:  Vitals:   07/31/18 1200 07/31/18 1210  BP: (!) 151/95   Pulse: 87 92  Resp: 11 16  Temp:    SpO2: 100% 100%    Last Pain:  Vitals:   07/31/18 1210  TempSrc:   PainSc: 6                  Ashanti Ratti

## 2018-07-31 NOTE — Anesthesia Procedure Notes (Signed)
Procedure Name: Intubation Date/Time: 07/31/2018 10:46 AM Performed by: Franco Nones, CRNA Pre-anesthesia Checklist: Patient identified, Patient being monitored, Timeout performed, Emergency Drugs available and Suction available Patient Re-evaluated:Patient Re-evaluated prior to induction Oxygen Delivery Method: Circle System Utilized Preoxygenation: Pre-oxygenation with 100% oxygen Induction Type: IV induction, Rapid sequence and Cricoid Pressure applied Laryngoscope Size: Miller and 2 Grade View: Grade I Tube type: Oral Tube size: 7.0 mm Number of attempts: 1 Airway Equipment and Method: Stylet and Oral airway Placement Confirmation: ETT inserted through vocal cords under direct vision,  positive ETCO2 and breath sounds checked- equal and bilateral Secured at: 22 cm Tube secured with: Tape Dental Injury: Teeth and Oropharynx as per pre-operative assessment

## 2018-07-31 NOTE — Discharge Instructions (Signed)
Total Laparoscopic Surgery : Oophorectomy A total laparoscopic hysterectomy is a minimally invasive surgery to remove the uterus and cervix. The fallopian tubes and ovaries can also be removed (bilateral salpingo-oophorectomy) during this surgery, if necessary. This procedure may be done to treat problems such as:  Noncancerous growths in the uterus (uterine fibroids) that cause symptoms.  A condition that causes the lining of the uterus (endometrium) to grow in other areas (endometriosis).  Problems with pelvic support. This is caused by weakened muscles of the pelvis following vaginal childbirth or menopause.  Cancer of the cervix, ovaries, uterus, or endometrium.  Excessive (dysfunctional) uterine bleeding. This surgery is performed by inserting a thin, lighted tube (laparoscope) and surgical instruments into small incisions in the abdomen. The laparoscope sends images to a monitor. The images help the health care provider perform the procedure. After this procedure, you will no longer be able to have a baby, and you will no longer have a menstrual period. Tell a health care provider about:  Any allergies you have.  All medicines you are taking, including vitamins, herbs, eye drops, creams, and over-the-counter medicines.  Any problems you or family members have had with anesthetic medicines.  Any blood disorders you have.  Any surgeries you have had.  Any medical conditions you have.  Whether you are pregnant or may be pregnant. What are the risks? Generally, this is a safe procedure. However, problems may occur, including:  Infection.  Bleeding.  Blood clots in the legs or lungs.  Allergic reactions to medicines.  Damage to other structures or organs.  The risk that the surgery may have to be switched to the regular one in which a large incision is made in the abdomen (abdominal hysterectomy). What happens before the procedure? Staying hydrated Follow instructions  from your health care provider about hydration, which may include:  Up to 2 hours before the procedure - you may continue to drink clear liquids, such as water, clear fruit juice, black coffee, and plain tea Eating and drinking restrictions Follow instructions from your health care provider about eating and drinking, which may include:  8 hours before the procedure - stop eating heavy meals or foods such as meat, fried foods, or fatty foods.  6 hours before the procedure - stop eating light meals or foods, such as toast or cereal.  6 hours before the procedure - stop drinking milk or drinks that contain milk.  2 hours before the procedure - stop drinking clear liquids. Medicines  Ask your health care provider about: ? Changing or stopping your regular medicines. This is especially important if you are taking diabetes medicines or blood thinners. ? Taking over-the-counter medicines, vitamins, herbs, and supplements. ? Taking medicines such as aspirin and ibuprofen. These medicines can thin your blood. Do not take these medicines unless your health care provider tells you to take them.  You may be given antibiotic medicine to help prevent infection.  You may be asked to take laxatives.  You may be given medicines to help prevent nausea and vomiting after the procedure. General instructions  Ask your health care provider how your surgical site will be marked or identified.  You may be asked to shower with a germ-killing soap.  Do not use any products that contain nicotine or tobacco, such as cigarettes and e-cigarettes. If you need help quitting, ask your health care provider.  You may have an exam or testing, such as an ultrasound to determine the size and shape of your  pelvic organs.  You may have a blood or urine sample taken.  This procedure can affect the way you feel about yourself. Talk with your health care provider about the physical and emotional changes hysterectomy may  cause.  Plan to have someone take you home from the hospital or clinic.  Plan to have a responsible adult care for you for at least 24 hours after you leave the hospital or clinic. This is important. What happens during the procedure?  To lower your risk of infection: ? Your health care team will wash or sanitize their hands. ? Your skin will be washed with soap. ? Hair may be removed from the surgical area.  An IV will be inserted into one of your veins.  You will be given one or more of the following: ? A medicine to help you relax (sedative). ? A medicine to make you fall asleep (general anesthetic).  You will be given antibiotic medicine through your IV.  A tube may be inserted down your throat to help you breathe during the procedure.  A gas (carbon dioxide) will be used to inflate your abdomen to allow your surgeon to see inside of your abdomen.  Three or four small incisions will be made in your abdomen.  A laparoscope will be inserted into one of your incisions. Surgical instruments will be inserted through the other incisions in order to perform the procedure.  Your uterus and cervix may be removed through your vagina or cut into small pieces and removed through the small incisions. Any other organs that need to be removed will also be removed this way.  Carbon dioxide will be released from inside of your abdomen.  Your incisions will be closed with stitches (sutures).  A bandage (dressing) may be placed over your incisions. The procedure may vary among health care providers and hospitals. What happens after the procedure?  Your blood pressure, heart rate, breathing rate, and blood oxygen level will be monitored until the medicines you were given have worn off.  You will be given medicine for pain and nausea as needed.  Do not drive for 24 hours if you received a sedative. Summary  Total Laparoscopic hysterectomy is a procedure to remove your uterus, cervix and  sometimes the fallopian tubes and ovaries.  This procedure can affect the way you feel about yourself. Talk with your health care provider about the physical and emotional changes hysterectomy may cause.  After this procedure, you will no longer be able to have a baby, and you will no longer have a menstrual period.  You will be given pain medicine to control discomfort after this procedure. This information is not intended to replace advice given to you by your health care provider. Make sure you discuss any questions you have with your health care provider. Document Released: 12/10/2006 Document Revised: 04/25/2016 Document Reviewed: 04/25/2016 Elsevier Interactive Patient Education  2019 [employer]

## 2018-07-31 NOTE — Progress Notes (Signed)
Rachel Griffith cannot drive, operate machinery, sign legal documents for 24 hours.  She cannot return to work until after  her follow up visit on June 11,2020.

## 2018-07-31 NOTE — ED Provider Notes (Signed)
[facility] EMERGENCY DEPARTMENT Provider Note   CSN: [id number] Arrival date & time: 07/30/18  2309    History   Chief Complaint Chief Complaint  Patient presents with  . Abdominal Pain    HPI Rachel Griffith is a 33 y.o. female.   The history is provided by the patient.  Abdominal Pain  She has history of endometriosis and ovarian cysts and comes in because of severe left lower quadrant pain which started tonight.  She had been in the emergency department on May 29 with pelvic pain and found to have a complex variant cyst.  He was evaluated by Dr. Emelda FearFerguson of gynecology and discharged home with prescriptions for oxycodone-acetaminophen and ketorolac.  She was doing well until pain started again tonight.  She took ketorolac and she could not-acetaminophen without relief.  She rates pain at 9/10.  She cannot find a comfortable position.  There is associated nausea but no vomiting.  She denies fever or chills.  Past Medical History:  Diagnosis Date  . Anxiety   . Back injury   . Chronic pelvic pain in female   . Endometriosis   . Endometriosis determined by laparoscopy 10/06/2014  . GERD (gastroesophageal reflux disease)   . Ovarian cyst   . Pelvic pain in female 10/06/2014  . PONV (postoperative nausea and vomiting)    pt states that with 1st arthroscopy she "passed out and couldnt left her head" but they tell me it was a combination of the pain meds and anesthesia" No problems with next surgery. Not sure of meds given in anesthesia.  Marland Kitchen. POTS (postural orthostatic tachycardia syndrome)    pt reports this was diagnosed a couple of months ago by PCP (Dr Annia FriendlyBeard)  . PTSD (post-traumatic stress disorder)     Patient Active Problem List   Diagnosis Date Noted  . Ovarian cyst, bilateral 02/08/2016  . Postoperative abdominal pain 11/24/2014  . Status post laparoscopic assisted vaginal hysterectomy (LAVH) 11/23/2014  . Pelvic pain in female 10/06/2014  . Endometriosis determined by  laparoscopy 10/06/2014    Past Surgical History:  Procedure Laterality Date  . ABDOMINAL HYSTERECTOMY    . endometrosis  2014   fulgeration-[facility]  . KNEE SURGERY Bilateral    x2  . LAPAROSCOPY N/A 11/26/2014   Procedure: DIAGNOSTIC LAPAROSCOPY;  Surgeon: Tilda BurrowJohn Ferguson V, MD;  Location: [facility];  Service: Gynecology;  Laterality: N/A;     OB History    Gravida  2   Para  2   Term  2   Preterm      AB      Living        SAB      TAB      Ectopic      Multiple      Live Births               Home Medications    Prior to Admission medications   Medication Sig Start Date End Date Taking? Authorizing Provider  ALPRAZolam Prudy Feeler(XANAX) 0.25 MG tablet Take 0.25 mg by mouth daily as needed for anxiety.     [provider]  amphetamine-dextroamphetamine (ADDERALL) 20 MG tablet Take 20 mg by mouth 2 (two) times daily.    [provider]  cyclobenzaprine (FLEXERIL) 5 MG tablet Take 5 mg by mouth at bedtime.    [provider]  fludrocortisone (FLORINEF) 0.1 MG tablet Take 300 mcg by mouth daily. 10/29/17   [provider]  fluticasone (FLOVENT HFA)  110 MCG/ACT inhaler Flovent HFA 110 mcg/actuation aerosol inhaler  Inhale 1 puff twice a day by inhalation route.    [provider]  gabapentin (NEURONTIN) 600 MG tablet Take 600-1,800 mg by mouth 3 (three) times daily. Take 600 mg at 1600, take 600 mg at 2200 and 1800 at 0400.    [provider]  ketorolac (TORADOL) 10 MG tablet Take 1 tablet (10 mg total) by mouth every 6 (six) hours. For up to 5 days 07/26/18   [name], MD  norgestimate-ethinyl estradiol (ORTHO-CYCLEN) 0.25-35 MG-MCG tablet Take 1 tablet by mouth 3 (three) times daily for 7 days. 07/26/18 08/02/18  [name], MD  ondansetron (ZOFRAN) 8 MG tablet Take 1 tablet (8 mg total) by mouth every 8 (eight) hours as needed for nausea or vomiting. 07/29/18   Adline Potter, NP  oxyCODONE-acetaminophen  (PERCOCET/ROXICET) 5-325 MG tablet Take 1-2 tablets by mouth every 4 (four) hours as needed for moderate pain or severe pain. 07/26/18   [name], MD  pantoprazole (PROTONIX) 40 MG tablet Take 40 mg by mouth daily.    [provider]  potassium chloride SA (K-DUR,KLOR-CON) 20 MEQ tablet Take one (1) tablet bid x 3 days, then take one tablet a day 11/30/17   Cathren Laine, MD    Family History Family History  Problem Relation Age of Onset  . Cancer Father        prostate  . Other Sister        IBS  . Other Brother        Lyme Disease  . Dementia Maternal Grandmother   . Parkinson's disease Maternal Grandfather   . Thrombocytopenia Sister     Social History Social History   Tobacco Use  . Smoking status: Current Every Day Smoker    Packs/day: 0.50    Types: Cigarettes, E-cigarettes  . Smokeless tobacco: Never Used  Substance Use Topics  . Alcohol use: Yes    Comment: occ  . Drug use: No     Allergies   Cymbalta [duloxetine hcl]; Sulfa antibiotics; Trazodone and nefazodone; and Sudafed [pseudoephedrine hcl]   Review of Systems Review of Systems  Gastrointestinal: Positive for abdominal pain.  All other systems reviewed and are negative.    Physical Exam Updated Vital Signs BP (!) 163/86 (BP Location: Right Arm)   Pulse 88   Temp 98.2 F (36.8 C) (Oral)   Resp (!) 22   Ht 5\' 9"  (1.753 m)   Wt 65.7 kg   LMP 09/19/2014 Comment: post LAVH  SpO2 100%   BMI 21.39 kg/m   Physical Exam Vitals signs and nursing note reviewed.    33 year old female, in obvious pain, but in no acute distress.  She is constantly shifting position in the bed apparently trying to find a comfortable position.  Vital signs are significant for elevated blood pressure and respiratory rate. Oxygen saturation is 100%, which is normal. Head is normocephalic and atraumatic. PERRLA, EOMI. Oropharynx is clear. Neck is nontender and supple without adenopathy or JVD. Back is  nontender in the midline.  There is mild left CVA tenderness. Lungs are clear without rales, wheezes, or rhonchi. Chest is nontender. Heart has regular rate and rhythm without murmur. Abdomen is soft, flat, with mild left lower quadrant tenderness.  There is no rebound or guarding.  There are no masses or hepatosplenomegaly and peristalsis is hypoactive. Extremities have no cyanosis or edema, full range of motion is present. Skin  is warm and dry without rash. Neurologic: Mental status is normal, cranial nerves are intact, there are no motor or sensory deficits.  ED Treatments / Results  Labs (all labs ordered are listed, but only abnormal results are displayed) Labs Reviewed  COMPREHENSIVE METABOLIC PANEL - Abnormal; Notable for the following components:      Result Value   Potassium 2.8 (*)    Calcium 8.5 (*)    All other components within normal limits  CBC WITH DIFFERENTIAL/PLATELET - Abnormal; Notable for the following components:   RBC 3.64 (*)    Hemoglobin 11.8 (*)    HCT 33.9 (*)    All other components within normal limits  LIPASE, BLOOD  URINALYSIS, ROUTINE W REFLEX MICROSCOPIC  MAGNESIUM    EKG None  Radiology Ct Renal Stone Study  Result Date: 07/31/2018 CLINICAL DATA:  Worsening abdominal pain. EXAM: CT ABDOMEN AND PELVIS WITHOUT CONTRAST TECHNIQUE: Multidetector CT imaging of the abdomen and pelvis was performed following the standard protocol without IV contrast. COMPARISON:  CT dated 11/25/2014. FINDINGS: Lower chest: No acute abnormality. Hepatobiliary: No focal liver abnormality is seen. No gallstones, gallbladder wall thickening, or biliary dilatation. Pancreas: Unremarkable. No pancreatic ductal dilatation or surrounding inflammatory changes. Spleen: Normal in size without focal abnormality. Adrenals/Urinary Tract: Adrenal glands are unremarkable. Kidneys are normal, without renal calculi, focal lesion, or hydronephrosis. Bladder is unremarkable. Stomach/Bowel:  There is a large amount of stool in the colon, especially the rectum. There is no evidence of a small-bowel obstruction. The appendix is not reliably identified on this exam. Vascular/Lymphatic: No significant vascular findings are present. No enlarged abdominal or pelvic lymph nodes. Reproductive: There is a complex 3.9 x 4.5 cm left adnexal cystic structure. The patient is status post prior hysterectomy. The right ovary is not well visualized. Other: There is no significant free fluid in the pelvis. Musculoskeletal: No acute or significant osseous findings. IMPRESSION: 1. No definite acute intra-abdominal abnormality detected. 2. Interval increase in size of a complex left adnexal cystic structure currently measuring approximately 3.9 x 4.5 cm. This is favored to represent a hemorrhagic cyst. If there is high clinical suspicion for ovarian torsion, follow-up with ultrasound is recommended. 3. Large amount of stool throughout the colon, especially the cecum. 4. Status post hysterectomy. 5. The appendix is not reliably identified, however there are no significant inflammatory changes in the right lower quadrant or the patient's pelvis. Electronically Signed   By: Katherine Mantle M.D.   On: 07/31/2018 01:46    Procedures Procedures   Medications Ordered in ED Medications - No data to display   Initial Impression / Assessment and Plan / ED Course  I have reviewed the triage vital signs and the nursing notes.  Pertinent labs & imaging results that were available during my care of the patient were reviewed by me and considered in my medical decision making (see chart for details).  Left lower quadrant pain in setting of known ovarian cyst.  However, I am concerned about possible ureterolithiasis given the nature of her presentation.  Old records are reviewed confirming recent ED visit for pelvic pain with diagnosis of complex left ovarian cyst.  She will be sent for renal stone protocol CT scan.  She  is given IV fluids, hydromorphone, ondansetron.  CT scan does not show evidence of urolithiasis, cyst appears to have increased in size slightly.  She has not received adequate pain relief from hydromorphone and this will be repeated.  Will send for ultrasound to rule  out torsion.  Also, noted to be severely hypokalemic and is given IV potassium.  Will check magnesium level.  Ultrasound confirms enlarging cyst, blood flow present.  Magnesium level is normal.  Patient has required repeated doses of hydromorphone for pain relief.  Although no pathology obvious to require admission, she is requiring too much pain medication to be discharged.  Case is discussed with Dr. Despina Hidden of GYN service, who agrees to admit the patient.  Final Clinical Impressions(s) / ED Diagnoses   Final diagnoses:  Pelvic pain in female  Ovarian cyst, left  Hypokalemia    ED Discharge Orders    None       Dione Booze, MD 07/31/18 [phone]

## 2018-07-31 NOTE — H&P (Signed)
Preoperative History and Physical  Rachel Griffith is a 33 y.o. G2P2000 with Patient's last menstrual period was 09/19/2014. admitted for a laparoscopic left salpingo oophorectomy.  She is previous s/p TVH and RSO in separate procedures [facility] for right [facility] by Dr Emelda FearFerguson Her pain associated with this cyst cannot be managed as an outpatient She understands this will make her menopausal so I will keep her on OCP for her HRT, most physiologic levels for this age group  PMH:    Past Medical History:  Diagnosis Date  . Anxiety   . Back injury   . Chronic pelvic pain in female   . Endometriosis   . Endometriosis determined by laparoscopy 10/06/2014  . GERD (gastroesophageal reflux disease)   . Ovarian cyst   . Pelvic pain in female 10/06/2014  . PONV (postoperative nausea and vomiting)    pt states that with 1st arthroscopy she "passed out and couldnt left her head" but they tell me it was a combination of the pain meds and anesthesia" No problems with next surgery. Not sure of meds given in anesthesia.  Marland Kitchen. POTS (postural orthostatic tachycardia syndrome)    pt reports this was diagnosed a couple of months ago by PCP (Dr Annia FriendlyBeard)  . PTSD (post-traumatic stress disorder)     PSH:     Past Surgical History:  Procedure Laterality Date  . ABDOMINAL HYSTERECTOMY    . endometrosis  2014   [facility]  . KNEE SURGERY Bilateral    x2  . LAPAROSCOPY N/A 11/26/2014   Procedure: DIAGNOSTIC LAPAROSCOPY;  Surgeon: Tilda BurrowJohn Ferguson V, MD;  Location: [facility];  Service: Gynecology;  Laterality: N/A;    POb/GynH:      OB History    Gravida  2   Para  2   Term  2   Preterm      AB      Living        SAB      TAB      Ectopic      Multiple      Live Births              SH:   Social History   Tobacco Use  . Smoking status: Current Every Day Smoker    Packs/day: 0.50    Types: Cigarettes, E-cigarettes  . Smokeless tobacco:  Never Used  Substance Use Topics  . Alcohol use: Yes    Comment: occ  . Drug use: No    FH:    Family History  Problem Relation Age of Onset  . Cancer Father        prostate  . Other Sister        IBS  . Other Brother        Lyme Disease  . Dementia Maternal Grandmother   . Parkinson's disease Maternal Grandfather   . Thrombocytopenia Sister      Allergies:  Allergies  Allergen Reactions  . Cymbalta [Duloxetine Hcl]   . Sulfa Antibiotics Hives  . Trazodone And Nefazodone     Sever body pain   . Sudafed [Pseudoephedrine Hcl] Palpitations    Medications:       Current Facility-Administered Medications:  .  0.9 %  sodium chloride infusion, , Intravenous, STAT, Dione BoozeGlick, David, MD .  HYDROmorphone (DILAUDID) injection 1 mg, 1 mg, Intravenous, Q2H PRN, Dione BoozeGlick, David, MD .  ondansetron [facility](ZOFRAN) injection 4 mg, 4 mg, Intravenous, Q8H PRN,  Dione Booze, MD  Current Outpatient Medications:  .  ALPRAZolam (XANAX) 0.25 MG tablet, Take 0.25 mg by mouth daily as needed for anxiety. , Disp: , Rfl:  .  amphetamine-dextroamphetamine (ADDERALL) 20 MG tablet, Take 20 mg by mouth 2 (two) times daily., Disp: , Rfl:  .  buPROPion (WELLBUTRIN XL) 150 MG 24 hr tablet, Take 150 mg by mouth daily., Disp: , Rfl:  .  cyclobenzaprine (FLEXERIL) 5 MG tablet, Take 5 mg by mouth at bedtime as needed. , Disp: , Rfl:  .  fludrocortisone (FLORINEF) 0.1 MG tablet, Take 300 mcg by mouth daily., Disp: , Rfl: 0 .  gabapentin (NEURONTIN) 600 MG tablet, Take 600-1,800 mg by mouth 3 (three) times daily. Take 600 mg at 1600, take 600 mg at 2200 and 1800 at 0400., Disp: , Rfl:  .  ketorolac (TORADOL) 10 MG tablet, Take 1 tablet (10 mg total) by mouth every 6 (six) hours. For up to 5 days, Disp: 20 tablet, Rfl: 0 .  norgestimate-ethinyl estradiol (ORTHO-CYCLEN) 0.25-35 MG-MCG tablet, Take 1 tablet by mouth 3 (three) times daily for 7 days., Disp: 1 Package, Rfl: 0 .  ondansetron (ZOFRAN) 8 MG tablet, Take 1 tablet  (8 mg total) by mouth every 8 (eight) hours as needed for nausea or vomiting., Disp: 20 tablet, Rfl: 1 .  oxyCODONE-acetaminophen (PERCOCET/ROXICET) 5-325 MG tablet, Take 1-2 tablets by mouth every 4 (four) hours as needed for moderate pain or severe pain., Disp: 20 tablet, Rfl: 0 .  fluticasone (FLOVENT HFA) 110 MCG/ACT inhaler, Inhale 2 puffs into the lungs 2 (two) times daily as needed. , Disp: , Rfl:   Review of Systems:   Review of Systems  Constitutional: Negative for fever, chills, weight loss, malaise/fatigue and diaphoresis.  HENT: Negative for hearing loss, ear pain, nosebleeds, congestion, sore throat, neck pain, tinnitus and ear discharge.   Eyes: Negative for blurred vision, double vision, photophobia, pain, discharge and redness.  Respiratory: Negative for cough, hemoptysis, sputum production, shortness of breath, wheezing and stridor.   Cardiovascular: Negative for chest pain, palpitations, orthopnea, claudication, leg swelling and PND.  Gastrointestinal: Positive for abdominal pain. Negative for heartburn, nausea, vomiting, diarrhea, constipation, blood in stool and melena.  Genitourinary: Negative for dysuria, urgency, frequency, hematuria and flank pain.  Musculoskeletal: Negative for myalgias, back pain, joint pain and falls.  Skin: Negative for itching and rash.  Neurological: Negative for dizziness, tingling, tremors, sensory change, speech change, focal weakness, seizures, loss of consciousness, weakness and headaches.  Endo/Heme/Allergies: Negative for environmental allergies and polydipsia. Does not bruise/bleed easily.  Psychiatric/Behavioral: Negative for depression, suicidal ideas, hallucinations, memory loss and substance abuse. The patient is not nervous/anxious and does not have insomnia.      PHYSICAL EXAM:  Blood pressure 133/72, pulse 79, temperature 98.2 F (36.8 C), temperature source Oral, resp. rate 18, height  (1.753 m), weight 65.7 kg, last  menstrual period 09/19/2014, SpO2 100 %.    Vitals reviewed. Constitutional: She is oriented to person, place, and time. She appears well-developed and well-nourished.  HENT:  Head: Normocephalic and atraumatic.  Right Ear: External ear normal.  Left Ear: External ear normal.  Nose: Nose normal.  Mouth/Throat: Oropharynx is clear and moist.  Eyes: Conjunctivae and EOM are normal. Pupils are equal, round, and reactive to light. Right eye exhibits no discharge. Left eye exhibits no discharge. No scleral icterus.  Neck: Normal range of motion. Neck supple. No tracheal deviation present. No thyromegaly present.  Cardiovascular: Normal rate, regular  rhythm, normal heart sounds and intact distal pulses.  Exam reveals no gallop and no friction rub.   No murmur heard. Respiratory: Effort normal and breath sounds normal. No respiratory distress. She has no wheezes. She has no rales. She exhibits no tenderness.  GI: Soft. Bowel sounds are normal. She exhibits no distension and no mass. There is tenderness left sided moderate. There is no rebound and no guarding.  Genitourinary:       Vulva is normal without lesions Vagina is pink moist without discharge Cervix absnet Uterus is uterus absent Adnexa is per sonogram CT scan Musculoskeletal: Normal range of motion. She exhibits no edema and no tenderness.  Neurological: She is alert and oriented to person, place, and time. She has normal reflexes. She displays normal reflexes. No cranial nerve deficit. She exhibits normal muscle tone. Coordination normal.  Skin: Skin is warm and dry. No rash noted. No erythema. No pallor.  Psychiatric: She has a normal mood and affect. Her behavior is normal. Judgment and thought content normal.    Labs: Results for orders placed or performed during the hospital encounter of 07/31/18 (from the past 336 hour(s))  Comprehensive metabolic panel   Collection Time: 07/31/18 12:24 AM  Result Value Ref Range   Sodium  138 135 - 145 mmol/L   Potassium 2.8 (L) 3.5 - 5.1 mmol/L   Chloride 101 98 - 111 mmol/L   CO2 26 22 - 32 mmol/L   Glucose, Bld 99 70 - 99 mg/dL   BUN 10 6 - 20 mg/dL   Creatinine, Ser 1.61 0.44 - 1.00 mg/dL   Calcium 8.5 (L) 8.9 - 10.3 mg/dL   Total Protein 6.8 6.5 - 8.1 g/dL   Albumin 3.5 3.5 - 5.0 g/dL   AST 23 15 - 41 U/L   ALT 23 0 - 44 U/L   Alkaline Phosphatase 49 38 - 126 U/L   Total Bilirubin 0.4 0.3 - 1.2 mg/dL   GFR calc non Af Amer >60 >60 mL/min   GFR calc Af Amer >60 >60 mL/min   Anion gap 11 5 - 15  Lipase, blood   Collection Time: 07/31/18 12:24 AM  Result Value Ref Range   Lipase 33 11 - 51 U/L  CBC with Differential   Collection Time: 07/31/18 12:24 AM  Result Value Ref Range   WBC 9.0 4.0 - 10.5 K/uL   RBC 3.64 (L) 3.87 - 5.11 MIL/uL   Hemoglobin 11.8 (L) 12.0 - 15.0 g/dL   HCT 09.6 (L) 04.5 - 40.9 %   MCV 93.1 80.0 - 100.0 fL   MCH 32.4 26.0 - 34.0 pg   MCHC 34.8 30.0 - 36.0 g/dL   RDW 81.1 91.4 - 78.2 %   Platelets 192 150 - 400 K/uL   nRBC 0.0 0.0 - 0.2 %   Neutrophils Relative % 79 %   Neutro Abs 7.0 1.7 - 7.7 K/uL   Lymphocytes Relative 16 %   Lymphs Abs 1.5 0.7 - 4.0 K/uL   Monocytes Relative 5 %   Monocytes Absolute 0.5 0.1 - 1.0 K/uL   Eosinophils Relative 0 %   Eosinophils Absolute 0.0 0.0 - 0.5 K/uL   Basophils Relative 0 %   Basophils Absolute 0.0 0.0 - 0.1 K/uL   Immature Granulocytes 0 %   Abs Immature Granulocytes 0.03 0.00 - 0.07 K/uL  Magnesium   Collection Time: 07/31/18 12:24 AM  Result Value Ref Range   Magnesium 2.0 1.7 - 2.4 mg/dL  Urinalysis, Routine  w reflex microscopic   Collection Time: 07/31/18  5:30 AM  Result Value Ref Range   Color, Urine COLORLESS (A) YELLOW   APPearance CLEAR CLEAR   Specific Gravity, Urine 1.002 (L) 1.005 - 1.030   pH 8.0 5.0 - 8.0   Glucose, UA NEGATIVE NEGATIVE mg/dL   Hgb urine dipstick SMALL (A) NEGATIVE   Bilirubin Urine NEGATIVE NEGATIVE   Ketones, ur NEGATIVE NEGATIVE mg/dL    Protein, ur NEGATIVE NEGATIVE mg/dL   Nitrite NEGATIVE NEGATIVE   Leukocytes,Ua NEGATIVE NEGATIVE   WBC, UA 0-5 0 - 5 WBC/hpf   Bacteria, UA RARE (A) NONE SEEN  SARS Coronavirus 2 (CEPHEID - Performed in [facility] hospital lab), Hosp Order   Collection Time: 07/31/18  5:47 AM  Result Value Ref Range   SARS Coronavirus 2 NEGATIVE NEGATIVE  Potassium   Collection Time: 07/31/18  9:13 AM  Result Value Ref Range   Potassium 2.9 (L) 3.5 - 5.1 mmol/L  hCG, serum, qualitative (Not at [facility])   Collection Time: 07/31/18  9:13 AM  Result Value Ref Range   Preg, Serum NEGATIVE NEGATIVE  Results for orders placed or performed during the hospital encounter of 07/25/18 (from the past 336 hour(s))  Urinalysis, Routine w reflex microscopic   Collection Time: 07/25/18  6:52 PM  Result Value Ref Range   Color, Urine YELLOW YELLOW   APPearance CLEAR CLEAR   Specific Gravity, Urine 1.010 1.005 - 1.030   pH 7.0 5.0 - 8.0   Glucose, UA NEGATIVE NEGATIVE mg/dL   Hgb urine dipstick NEGATIVE NEGATIVE   Bilirubin Urine NEGATIVE NEGATIVE   Ketones, ur NEGATIVE NEGATIVE mg/dL   Protein, ur NEGATIVE NEGATIVE mg/dL   Nitrite NEGATIVE NEGATIVE   Leukocytes,Ua NEGATIVE NEGATIVE  CBC   Collection Time: 07/25/18 10:20 PM  Result Value Ref Range   WBC 6.1 4.0 - 10.5 K/uL   RBC 3.77 (L) 3.87 - 5.11 MIL/uL   Hemoglobin 12.0 12.0 - 15.0 g/dL   HCT 81.1 (L) 91.4 - 78.2 %   MCV 92.3 80.0 - 100.0 fL   MCH 31.8 26.0 - 34.0 pg   MCHC 34.5 30.0 - 36.0 g/dL   RDW 95.6 (L) 21.3 - 08.6 %   Platelets 211 150 - 400 K/uL   nRBC 0.0 0.0 - 0.2 %  Comprehensive metabolic panel   Collection Time: 07/25/18 10:20 PM  Result Value Ref Range   Sodium 141 135 - 145 mmol/L   Potassium 2.9 (L) 3.5 - 5.1 mmol/L   Chloride 103 98 - 111 mmol/L   CO2 28 22 - 32 mmol/L   Glucose, Bld 101 (H) 70 - 99 mg/dL   BUN 12 6 - 20 mg/dL   Creatinine, Ser 5.78 0.44 - 1.00 mg/dL   Calcium 8.7 (L) 8.9 - 10.3 mg/dL   Total Protein 6.6 6.5  - 8.1 g/dL   Albumin 3.4 (L) 3.5 - 5.0 g/dL   AST 18 15 - 41 U/L   ALT 15 0 - 44 U/L   Alkaline Phosphatase 51 38 - 126 U/L   Total Bilirubin 0.4 0.3 - 1.2 mg/dL   GFR calc non Af Amer >60 >60 mL/min   GFR calc Af Amer >60 >60 mL/min   Anion gap 10 5 - 15  Lactic acid, plasma   Collection Time: 07/25/18 10:21 PM  Result Value Ref Range   Lactic Acid, Venous 0.9 0.5 - 1.9 mmol/L    EKG: Orders placed or performed during the hospital encounter  of 12/01/17  . EKG 12-Lead  . EKG 12-Lead  . EKG    Imaging Studies: US Transvaginal Non-ob  Result Date: 07/25/2018 CLINICAL DATA:  Pelvic pain EXAM: TRANSABDOMINAL AND TRANSVAGINAL ULTRASOUND OF PELVIS DOPPLER ULTRASOUND OF OVARIES TECHNIQUE: Both transabdominal and transvaginal ultrasound examinations of the pelvis were performed. Transabdominal technique was performed for global imaging of the pelvis including uterus, ovaries, adnexal regions, and pelvic cul-de-sac. It was necessary to proceed with endovaginal exam following the transabdominal exam to visualize the left ovary. Color and duplex Doppler ultrasound was utilized to evaluate blood flow to the ovaries. COMPARISON:  None. FINDINGS: Uterus Surgically absent Endometrium Surgically absent Right ovary Surgically absent Left ovary Measurements: 3.7 x 3.7 x 4.1 cm = volume: 29.5 mL. There is a complex cyst that measures 2.8 x 2.5 x 2.4 cm with mild internal echoes. Pulsed Doppler evaluation of the left ovary demonstrates normal low-resistance arterial and venous waveforms. Other findings No abnormal free fluid. IMPRESSION: 1. Complex cyst of the left ovary without evidence of torsion. This could indicate a hemorrhagic cyst, though no free fluid is identified in the pelvis. 2. Surgically absent uterus and right ovary. Electronically Signed   By: Deatra Robinson M.D.   On: 07/25/2018 20:18   US Pelvis Complete  Result Date: 07/25/2018 CLINICAL DATA:  Pelvic pain EXAM: TRANSABDOMINAL AND  TRANSVAGINAL ULTRASOUND OF PELVIS DOPPLER ULTRASOUND OF OVARIES TECHNIQUE: Both transabdominal and transvaginal ultrasound examinations of the pelvis were performed. Transabdominal technique was performed for global imaging of the pelvis including uterus, ovaries, adnexal regions, and pelvic cul-de-sac. It was necessary to proceed with endovaginal exam following the transabdominal exam to visualize the left ovary. Color and duplex Doppler ultrasound was utilized to evaluate blood flow to the ovaries. COMPARISON:  None. FINDINGS: Uterus Surgically absent Endometrium Surgically absent Right ovary Surgically absent Left ovary Measurements: 3.7 x 3.7 x 4.1 cm = volume: 29.5 mL. There is a complex cyst that measures 2.8 x 2.5 x 2.4 cm with mild internal echoes. Pulsed Doppler evaluation of the left ovary demonstrates normal low-resistance arterial and venous waveforms. Other findings No abnormal free fluid. IMPRESSION: 1. Complex cyst of the left ovary without evidence of torsion. This could indicate a hemorrhagic cyst, though no free fluid is identified in the pelvis. 2. Surgically absent uterus and right ovary. Electronically Signed   By: Deatra Robinson M.D.   On: 07/25/2018 20:18   [place] Art/ven Flow Abd Pelv Doppler  Result Date: 07/25/2018 CLINICAL DATA:  Pelvic pain EXAM: TRANSABDOMINAL AND TRANSVAGINAL ULTRASOUND OF PELVIS DOPPLER ULTRASOUND OF OVARIES TECHNIQUE: Both transabdominal and transvaginal ultrasound examinations of the pelvis were performed. Transabdominal technique was performed for global imaging of the pelvis including uterus, ovaries, adnexal regions, and pelvic cul-de-sac. It was necessary to proceed with endovaginal exam following the transabdominal exam to visualize the left ovary. Color and duplex Doppler ultrasound was utilized to evaluate blood flow to the ovaries. COMPARISON:  None. FINDINGS: Uterus Surgically absent Endometrium Surgically absent Right ovary Surgically absent Left ovary  Measurements: 3.7 x 3.7 x 4.1 cm = volume: 29.5 mL. There is a complex cyst that measures 2.8 x 2.5 x 2.4 cm with mild internal echoes. Pulsed Doppler evaluation of the left ovary demonstrates normal low-resistance arterial and venous waveforms. Other findings No abnormal free fluid. IMPRESSION: 1. Complex cyst of the left ovary without evidence of torsion. This could indicate a hemorrhagic cyst, though no free fluid is identified in the pelvis. 2. Surgically absent uterus and right  ovary. Electronically Signed   By: Deatra Robinson M.D.   On: 07/25/2018 20:18   Ct Renal Stone Study  Result Date: 07/31/2018 CLINICAL DATA:  Worsening abdominal pain. EXAM: CT ABDOMEN AND PELVIS WITHOUT CONTRAST TECHNIQUE: Multidetector CT imaging of the abdomen and pelvis was performed following the standard protocol without IV contrast. COMPARISON:  CT dated 11/25/2014. FINDINGS: Lower chest: No acute abnormality. Hepatobiliary: No focal liver abnormality is seen. No gallstones, gallbladder wall thickening, or biliary dilatation. Pancreas: Unremarkable. No pancreatic ductal dilatation or surrounding inflammatory changes. Spleen: Normal in size without focal abnormality. Adrenals/Urinary Tract: Adrenal glands are unremarkable. Kidneys are normal, without renal calculi, focal lesion, or hydronephrosis. Bladder is unremarkable. Stomach/Bowel: There is a large amount of stool in the colon, especially the rectum. There is no evidence of a small-bowel obstruction. The appendix is not reliably identified on this exam. Vascular/Lymphatic: No significant vascular findings are present. No enlarged abdominal or pelvic lymph nodes. Reproductive: There is a complex 3.9 x 4.5 cm left adnexal cystic structure. The patient is status post prior hysterectomy. The right ovary is not well visualized. Other: There is no significant free fluid in the pelvis. Musculoskeletal: No acute or significant osseous findings. IMPRESSION: 1. No definite acute  intra-abdominal abnormality detected. 2. Interval increase in size of a complex left adnexal cystic structure currently measuring approximately 3.9 x 4.5 cm. This is favored to represent a hemorrhagic cyst. If there is high clinical suspicion for ovarian torsion, follow-up with ultrasound is recommended. 3. Large amount of stool throughout the colon, especially the cecum. 4. Status post hysterectomy. 5. The appendix is not reliably identified, however there are no significant inflammatory changes in the right lower quadrant or the patient's pelvis. Electronically Signed   By: Katherine Mantle M.D.   On: 07/31/2018 01:46   US Pelvic Complete W Transvaginal And Torsion R/o  Result Date: 07/31/2018 CLINICAL DATA:  Pelvic pain EXAM: TRANSABDOMINAL AND TRANSVAGINAL ULTRASOUND OF PELVIS DOPPLER ULTRASOUND OF OVARIES TECHNIQUE: Both transabdominal and transvaginal ultrasound examinations of the pelvis were performed. Transabdominal technique was performed for global imaging of the pelvis including uterus, ovaries, adnexal regions, and pelvic cul-de-sac. It was necessary to proceed with endovaginal exam following the transabdominal exam to visualize the ovaries. Color and duplex Doppler ultrasound was utilized to evaluate blood flow to the ovaries. COMPARISON:  Abdominal CT 07/31/2018.  Pelvic ultrasound 07/25/2018 FINDINGS: Uterus Surgically absent Right ovary History of right oophorectomy.  No adnexal mass. Left ovary Measurements: 4.2 x 4.5 x 3.6 cm = volume: 36 mL. Most of the ovarian volume is from an avascular mass with mid level echoes and a few superimposed septations. The cyst was previously much more hypoechoic. Complex cyst has enlarged from prior when it measured up to 2.8 cm. Arterial and venous waveforms are seen in the compressed left ovarian parenchyma. Other findings No abnormal free fluid. IMPRESSION: 1. 4.5 cm complex left ovarian cyst, most likely hemorrhagic cyst, which has enlarged and become more  complex since 07/25/2018 ultrasound. 2. Present left ovarian blood flow. Electronically Signed   By: Marnee Spring M.D.   On: 07/31/2018 04:40      Assessment: Increasingly painful left ovarian cyst Possible intermittent torsion   Patient Active Problem List   Diagnosis Date Noted  . Left ovarian cyst 07/31/2018  . Ovarian cyst, bilateral 02/08/2016  . Postoperative abdominal pain 11/24/2014  . Status post laparoscopic assisted vaginal hysterectomy ([facility]) 11/23/2014  . Pelvic pain in female 10/06/2014  . Endometriosis determined by  laparoscopy 10/06/2014    Plan: Laparoscopic left salpingo oophorectomy 07/31/2018  Pt understands the risks of surgery including but not limited t  excessive bleeding requiring transfusion or reoperation, post-operative infection requiring prolonged hospitalization or re-hospitalization and antibiotic therapy, and damage to other organs including bladder, bowel, ureters and major vessels.  The patient also understands the alternative treatment options which were discussed in full.  All questions were answered.  Lazaro Arms 07/31/2018 9:37 AM   Lazaro Arms 07/31/2018 9:36 AM

## 2018-07-31 NOTE — ED Triage Notes (Signed)
Pt was seen here x 5 days and and was followed by Dr. Emelda Fear, who believes pt has an ovarian torsion; pt was given birth control pills to try and correct it but pt states she is experiencing worsening pain tonight

## 2018-07-31 NOTE — Transfer of Care (Signed)
Immediate Anesthesia Transfer of Care Note  Patient: Rachel Griffith  Procedure(s) Performed: LAPAROSCOPIC LEFT SALPINGO OOPHORECTOMY (Left Abdomen)  Patient Location: PACU  Anesthesia Type:General  Level of Consciousness: awake, alert  and patient cooperative  Airway & Oxygen Therapy: Patient Spontanous Breathing  Post-op Assessment: Report given to RN and Post -op Vital signs reviewed and stable  Post vital signs: Reviewed and stable  Last Vitals:  Vitals Value Taken Time  BP    Temp    Pulse 105 07/31/2018 11:40 AM  Resp 5 07/31/2018 11:40 AM  SpO2 89 % 07/31/2018 11:40 AM  Vitals shown include unvalidated device data.  Last Pain:  Vitals:   07/31/18 1026  TempSrc:   PainSc: 6       Patients Stated Pain Goal: 8 (07/31/18 1026)  Complications: No apparent anesthesia complications

## 2018-07-31 NOTE — Anesthesia Preprocedure Evaluation (Signed)
Anesthesia Evaluation    History of Anesthesia Complications (+) PONV  Airway Mallampati: II       Dental  (+) Teeth Intact   Pulmonary Current Smoker,    breath sounds clear to auscultation       Cardiovascular  Rhythm:regular     Neuro/Psych Anxiety    GI/Hepatic GERD  ,  Endo/Other    Renal/GU      Musculoskeletal   Abdominal   Peds  Hematology   Anesthesia Other Findings ED visit, urgent to OR for GYN, HCG neg Dx as "ovarian torsion" Low K, 2.0, s/p run of K  States "panic attacks on emergence"   Reproductive/Obstetrics                             Anesthesia Physical Anesthesia Plan  ASA: III  Anesthesia Plan: General   Post-op Pain Management:    Induction:   PONV Risk Score and Plan:   Airway Management Planned:   Additional Equipment:   Intra-op Plan:   Post-operative Plan:   Informed Consent: I have reviewed the patients History and Physical, chart, labs and discussed the procedure including the risks, benefits and alternatives for the proposed anesthesia with the patient or authorized representative who has indicated his/her understanding and acceptance.       Plan Discussed with: Anesthesiologist  Anesthesia Plan Comments:         Anesthesia Quick Evaluation

## 2018-08-01 ENCOUNTER — Encounter (HOSPITAL_COMMUNITY): Payer: Self-pay | Admitting: Obstetrics & Gynecology

## 2018-08-04 ENCOUNTER — Encounter: Payer: Self-pay | Admitting: *Deleted

## 2018-08-05 ENCOUNTER — Encounter: Payer: Self-pay | Admitting: Obstetrics & Gynecology

## 2018-08-07 ENCOUNTER — Other Ambulatory Visit: Payer: Self-pay

## 2018-08-07 ENCOUNTER — Ambulatory Visit (INDEPENDENT_AMBULATORY_CARE_PROVIDER_SITE_OTHER): Payer: Self-pay | Admitting: Obstetrics & Gynecology

## 2018-08-07 ENCOUNTER — Encounter: Payer: Self-pay | Admitting: Obstetrics & Gynecology

## 2018-08-07 VITALS — Wt 149.0 lb

## 2018-08-07 DIAGNOSIS — Z9889 Other specified postprocedural states: Secondary | ICD-10-CM

## 2018-08-07 MED ORDER — DESOGESTREL-ETHINYL ESTRADIOL 0.15-30 MG-MCG PO TABS: 1.0000 | ORAL_TABLET | Freq: Every day | ORAL | 13 refills | Status: DC

## 2018-08-07 NOTE — Progress Notes (Signed)
  HPI: Patient returns for routine postoperative follow-up having undergone    ICD-10-CM   1. Postoperative state  Z98.890    S/P laparoscopic left oophorectomy 07/31/2018    .  The patient's immediate postoperative recovery has been unremarkable. Since hospital discharge the patient reports doing well no problems.   Current Outpatient Medications: ALPRAZolam (XANAX) 0.25 MG tablet, Take 0.25 mg by mouth daily as needed for anxiety. , Disp: , Rfl:  amphetamine-dextroamphetamine (ADDERALL) 20 MG tablet, Take 20 mg by mouth 2 (two) times daily., Disp: , Rfl:  buPROPion (WELLBUTRIN XL) 150 MG 24 hr tablet, Take 150 mg by mouth daily., Disp: , Rfl:  gabapentin (NEURONTIN) 600 MG tablet, Take 600-1,800 mg by mouth 3 (three) times daily. Take 600 mg at 1600, take 600 mg at 2200 and 1800 at 0400., Disp: , Rfl:  ketorolac (TORADOL) 10 MG tablet, Take 1 tablet (10 mg total) by mouth every 8 (eight) hours as needed., Disp: 15 tablet, Rfl: 0 cyclobenzaprine (FLEXERIL) 5 MG tablet, Take 5 mg by mouth at bedtime as needed. , Disp: , Rfl:  desogestrel-ethinyl estradiol (APRI) 0.15-30 MG-MCG tablet, Take 1 tablet by mouth daily., Disp: 1 Package, Rfl: 13 fludrocortisone (FLORINEF) 0.1 MG tablet, Take 300 mcg by mouth daily., Disp: , Rfl: 0 fluticasone (FLOVENT HFA) 110 MCG/ACT inhaler, Inhale 2 puffs into the lungs 2 (two) times daily as needed. , Disp: , Rfl:  ketorolac (TORADOL) 10 MG tablet, Take 1 tablet (10 mg total) by mouth every 6 (six) hours. For up to 5 days (Patient not taking: Reported on 08/07/2018), Disp: 20 tablet, Rfl: 0 ondansetron (ZOFRAN) 8 MG tablet, Take 1 tablet (8 mg total) by mouth every 8 (eight) hours as needed for nausea or vomiting. (Patient not taking: Reported on 08/07/2018), Disp: 20 tablet, Rfl: 1 oxyCODONE-acetaminophen (PERCOCET) 5-325 MG tablet, Take 1-2 tablets by mouth every 4 (four) hours as needed for severe pain. (Patient not taking: Reported on 08/07/2018), Disp: 30 tablet,  Rfl: 0 oxyCODONE-acetaminophen (PERCOCET/ROXICET) 5-325 MG tablet, Take 1-2 tablets by mouth every 4 (four) hours as needed for moderate pain or severe pain. (Patient not taking: Reported on 08/07/2018), Disp: 20 tablet, Rfl: 0  No current facility-administered medications for this visit.     Weight 149 lb (67.6 kg), last menstrual period 09/19/2014.  Physical Exam: Incision clean dry intact x 3 Abdomen soft non tender  Diagnostic Tests:   Pathology: benign  Impression:   ICD-10-CM   1. Postoperative state  Z98.890    S/P laparoscopic left oophorectomy 07/31/2018  USE OCP as HRT due to her age   Plan: Meds ordered this encounter  Medications  . desogestrel-ethinyl estradiol (APRI) 0.15-30 MG-MCG tablet    Sig: Take 1 tablet by mouth daily.    Dispense:  1 Package    Refill:  13    Patient is instructed to take continously     Follow up: prn   Florian Buff, MD

## 2019-01-06 ENCOUNTER — Other Ambulatory Visit: Payer: Self-pay

## 2019-01-06 DIAGNOSIS — Z20822 Contact with and (suspected) exposure to covid-19: Secondary | ICD-10-CM

## 2019-01-07 LAB — NOVEL CORONAVIRUS, NAA: SARS-CoV-2, NAA: NOT DETECTED

## 2019-03-11 ENCOUNTER — Ambulatory Visit: Payer: HRSA Program | Attending: Internal Medicine

## 2019-03-11 ENCOUNTER — Other Ambulatory Visit: Payer: Self-pay

## 2019-03-11 DIAGNOSIS — U071 COVID-19: Secondary | ICD-10-CM | POA: Insufficient documentation

## 2019-03-11 DIAGNOSIS — Z20822 Contact with and (suspected) exposure to covid-19: Secondary | ICD-10-CM | POA: Diagnosis present

## 2019-03-12 LAB — NOVEL CORONAVIRUS, NAA: SARS-CoV-2, NAA: DETECTED — AB

## 2019-03-30 ENCOUNTER — Emergency Department (HOSPITAL_COMMUNITY)
Admission: EM | Admit: 2019-03-30 | Discharge: 2019-03-30 | Disposition: A | Discharge status: Home / Self Care | Payer: Self-pay | Attending: Emergency Medicine | Admitting: Emergency Medicine

## 2019-03-30 ENCOUNTER — Other Ambulatory Visit: Payer: Self-pay

## 2019-03-30 ENCOUNTER — Encounter (HOSPITAL_COMMUNITY): Payer: Self-pay | Admitting: Emergency Medicine

## 2019-03-30 DIAGNOSIS — E876 Hypokalemia: Secondary | ICD-10-CM | POA: Insufficient documentation

## 2019-03-30 DIAGNOSIS — F17210 Nicotine dependence, cigarettes, uncomplicated: Secondary | ICD-10-CM | POA: Insufficient documentation

## 2019-03-30 DIAGNOSIS — Z79899 Other long term (current) drug therapy: Secondary | ICD-10-CM | POA: Insufficient documentation

## 2019-03-30 DIAGNOSIS — U071 COVID-19: Secondary | ICD-10-CM | POA: Insufficient documentation

## 2019-03-30 DIAGNOSIS — J40 Bronchitis, not specified as acute or chronic: Secondary | ICD-10-CM | POA: Insufficient documentation

## 2019-03-30 DIAGNOSIS — R000 Tachycardia, unspecified: Secondary | ICD-10-CM | POA: Insufficient documentation

## 2019-03-30 LAB — CBC WITH DIFFERENTIAL/PLATELET
Abs Immature Granulocytes: 0.02 10*3/uL (ref 0.00–0.07)
Basophils Absolute: 0 10*3/uL (ref 0.0–0.1)
Basophils Relative: 0 %
Eosinophils Absolute: 0.1 10*3/uL (ref 0.0–0.5)
Eosinophils Relative: 1 %
HCT: 37.3 % (ref 36.0–46.0)
Hemoglobin: 13.3 g/dL (ref 12.0–15.0)
Immature Granulocytes: 0 %
Lymphocytes Relative: 27 %
Lymphs Abs: 2.1 10*3/uL (ref 0.7–4.0)
MCH: 32.5 pg (ref 26.0–34.0)
MCHC: 35.7 g/dL (ref 30.0–36.0)
MCV: 91.2 fL (ref 80.0–100.0)
Monocytes Absolute: 0.4 10*3/uL (ref 0.1–1.0)
Monocytes Relative: 5 %
Neutro Abs: 5.4 10*3/uL (ref 1.7–7.7)
Neutrophils Relative %: 67 %
Platelets: 302 10*3/uL (ref 150–400)
RBC: 4.09 MIL/uL (ref 3.87–5.11)
RDW: 11.1 % — ABNORMAL LOW (ref 11.5–15.5)
WBC: 8.1 10*3/uL (ref 4.0–10.5)
nRBC: 0 % (ref 0.0–0.2)

## 2019-03-30 LAB — COMPREHENSIVE METABOLIC PANEL
ALT: 21 U/L (ref 0–44)
AST: 19 U/L (ref 15–41)
Albumin: 3.9 g/dL (ref 3.5–5.0)
Alkaline Phosphatase: 50 U/L (ref 38–126)
Anion gap: 12 (ref 5–15)
BUN: 10 mg/dL (ref 6–20)
CO2: 24 mmol/L (ref 22–32)
Calcium: 9.1 mg/dL (ref 8.9–10.3)
Chloride: 102 mmol/L (ref 98–111)
Creatinine, Ser: 0.87 mg/dL (ref 0.44–1.00)
GFR calc Af Amer: >60 mL/min (ref >60)
GFR calc non Af Amer: >60 mL/min (ref >60)
Glucose, Bld: 92 mg/dL (ref 70–99)
Potassium: 2.6 mmol/L — CL (ref 3.5–5.1)
Sodium: 138 mmol/L (ref 135–145)
Total Bilirubin: 0.4 mg/dL (ref 0.3–1.2)
Total Protein: 7.6 g/dL (ref 6.5–8.1)

## 2019-03-30 LAB — D-DIMER, QUANTITATIVE: D-Dimer, Quant: 0.37 ug/mL-FEU (ref 0.00–0.50)

## 2019-03-30 LAB — POC URINE PREG, ED: Preg Test, Ur: NEGATIVE

## 2019-03-30 MED ORDER — ONDANSETRON 8 MG PO TBDP
8.0000 mg | ORAL_TABLET | Freq: Once | ORAL | Status: AC
  Administered 2019-03-30: 08:00:00 8 mg via ORAL
  Filled 2019-03-30: qty 1

## 2019-03-30 MED ORDER — BENZONATATE 100 MG PO CAPS
200.0000 mg | ORAL_CAPSULE | Freq: Once | ORAL | Status: AC
  Administered 2019-03-30: 200 mg via ORAL
  Filled 2019-03-30 (×2): qty 2

## 2019-03-30 MED ORDER — POTASSIUM CHLORIDE CRYS ER 20 MEQ PO TBCR
40.0000 meq | EXTENDED_RELEASE_TABLET | Freq: Once | ORAL | Status: AC
  Administered 2019-03-30: 40 meq via ORAL
  Filled 2019-03-30: qty 2

## 2019-03-30 MED ORDER — MAGNESIUM SULFATE 2 GM/50ML IV SOLN
2.0000 g | Freq: Once | INTRAVENOUS | Status: AC
  Administered 2019-03-30: 2 g via INTRAVENOUS
  Filled 2019-03-30: qty 50

## 2019-03-30 MED ORDER — POTASSIUM CHLORIDE CRYS ER 20 MEQ PO TBCR
60.0000 meq | EXTENDED_RELEASE_TABLET | Freq: Once | ORAL | Status: AC
  Administered 2019-03-30: 60 meq via ORAL
  Filled 2019-03-30: qty 3

## 2019-03-30 NOTE — ED Notes (Signed)
Date and time results received: 03/30/19 0618 (use smartphrase ".now" to insert current time)  Test: Potassium Critical Value: 2.6  Name of Provider Notified: Rhunette Croft  Orders Received? Or Actions Taken?:

## 2019-03-30 NOTE — ED Notes (Signed)
Pt aware of need for urine specimen. Used bathroom before arrival to ED.

## 2019-03-30 NOTE — ED Provider Notes (Signed)
[facility] EMERGENCY DEPARTMENT Provider Note   CSN: [id number] Arrival date & time: 03/30/19  0518     History Chief Complaint  Patient presents with  . Tachycardia    Rachel Griffith is a 34 y.o. female.  HPI     34 year old female with history of endometriosis comes in a chief complaint of chest pain and palpitations.  She is 3 weeks post diagnosis of COVID-19.  She reports that she has had persistent cough, however her resting heart rate is usually in the 60s to 80s range.  This evening she has been having palpitations and her heart rate has been consistently over 120.  She is having some chest discomfort as well.  There is no history of PE, DVT and patient is not on any estrogen therapy.  She denies any illicit substance use or smoking.  She does indicate that she has history of anxiety, but does not think that that is the primary reason for her to have her symptoms.  Past Medical History:  Diagnosis Date  . Anxiety   . Back injury   . Chronic pelvic pain in female   . Endometriosis   . Endometriosis determined by laparoscopy 10/06/2014  . GERD (gastroesophageal reflux disease)   . Ovarian cyst   . Pelvic pain in female 10/06/2014  . PONV (postoperative nausea and vomiting)    pt states that with 1st arthroscopy she "passed out and couldnt left her head" but they tell me it was a combination of the pain meds and anesthesia" No problems with next surgery. Not sure of meds given in anesthesia.  Marland Kitchen POTS (postural orthostatic tachycardia syndrome)    pt reports this was diagnosed a couple of months ago by PCP (Dr Annia Friendly)  . PTSD (post-traumatic stress disorder)     Patient Active Problem List   Diagnosis Date Noted  . Left ovarian cyst 07/31/2018  . Ovarian cyst, bilateral 02/08/2016  . Postoperative abdominal pain 11/24/2014  . Status post laparoscopic assisted vaginal hysterectomy (LAVH) 11/23/2014  . Pelvic pain in female 10/06/2014  . Endometriosis determined by  laparoscopy 10/06/2014    Past Surgical History:  Procedure Laterality Date  . ABDOMINAL HYSTERECTOMY    . endometrosis  2014   fulgeration-[facility]  . KNEE SURGERY Bilateral    x2  . LAPAROSCOPIC BILATERAL SALPINGO OOPHERECTOMY Left 07/31/2018   Procedure: LAPAROSCOPIC LEFT SALPINGO OOPHORECTOMY;  Surgeon: Lazaro Arms, MD;  Location: [facility];  Service: Gynecology;  Laterality: Left;  . LAPAROSCOPY N/A 11/26/2014   Procedure: DIAGNOSTIC LAPAROSCOPY;  Surgeon: [name], MD;  Location: [facility];  Service: Gynecology;  Laterality: N/A;     OB History    Gravida  2   Para  2   Term  2   Preterm      AB      Living        SAB      TAB      Ectopic      Multiple      Live Births              Family History  Problem Relation Age of Onset  . Cancer Father        prostate  . Other Sister        IBS  . Other Brother        Lyme Disease  . Dementia Maternal Grandmother   . Parkinson's disease Maternal Grandfather   . Thrombocytopenia Sister  Social History   Tobacco Use  . Smoking status: Current Every Day Smoker    Packs/day: 0.50    Types: Cigarettes, E-cigarettes  . Smokeless tobacco: Never Used  Substance Use Topics  . Alcohol use: Yes    Comment: occ  . Drug use: No    Home Medications Prior to Admission medications   Medication Sig Start Date End Date Taking? Authorizing Provider  ALPRAZolam Prudy Feeler) 0.25 MG tablet Take 0.25 mg by mouth daily as needed for anxiety.     [provider]  amphetamine-dextroamphetamine (ADDERALL) 20 MG tablet Take 20 mg by mouth 2 (two) times daily.    [provider]  buPROPion (WELLBUTRIN XL) 150 MG 24 hr tablet Take 150 mg by mouth daily.    [provider]  cyclobenzaprine (FLEXERIL) 5 MG tablet Take 5 mg by mouth at bedtime as needed.     [provider]  desogestrel-ethinyl estradiol (APRI) 0.15-30 MG-MCG tablet Take 1 tablet by mouth daily. 08/07/18   Lazaro Arms,  MD  fludrocortisone (FLORINEF) 0.1 MG tablet Take 300 mcg by mouth daily. 10/29/17   [provider]  fluticasone (FLOVENT HFA) 110 MCG/ACT inhaler Inhale 2 puffs into the lungs 2 (two) times daily as needed.     [provider]  gabapentin (NEURONTIN) 600 MG tablet Take 600-1,800 mg by mouth 3 (three) times daily. Take 600 mg at 1600, take 600 mg at 2200 and 1800 at 0400.    [provider]  ketorolac (TORADOL) 10 MG tablet Take 1 tablet (10 mg total) by mouth every 6 (six) hours. For up to 5 days Patient not taking: Reported on 08/07/2018 07/26/18   [name], MD  ketorolac (TORADOL) 10 MG tablet Take 1 tablet (10 mg total) by mouth every 8 (eight) hours as needed. 07/31/18   Lazaro Arms, MD  ondansetron (ZOFRAN) 8 MG tablet Take 1 tablet (8 mg total) by mouth every 8 (eight) hours as needed for nausea or vomiting. Patient not taking: Reported on 08/07/2018 07/29/18   Adline Potter, NP  oxyCODONE-acetaminophen (PERCOCET) 5-325 MG tablet Take 1-2 tablets by mouth every 4 (four) hours as needed for severe pain. Patient not taking: Reported on 08/07/2018 07/31/18   Lazaro Arms, MD  oxyCODONE-acetaminophen (PERCOCET/ROXICET) 5-325 MG tablet Take 1-2 tablets by mouth every 4 (four) hours as needed for moderate pain or severe pain. Patient not taking: Reported on 08/07/2018 07/26/18   [name], MD    Allergies    Cymbalta [duloxetine hcl], Sulfa antibiotics, Trazodone and nefazodone, and Sudafed [pseudoephedrine hcl]  Review of Systems   Review of Systems  Constitutional: Positive for activity change.  Respiratory: Positive for cough.   Cardiovascular: Positive for palpitations.  Gastrointestinal: Negative for nausea and vomiting.  All other systems reviewed and are negative.   Physical Exam Updated Vital Signs BP (!) 152/94 (BP Location: Left Arm)   Pulse 90   Temp 98.2 F (36.8 C) (Oral)   Resp 20   Ht 5\' 9"  (1.753 m)   Wt 63.5 kg   LMP  09/19/2014 Comment: post LAVH  SpO2 100%   BMI 20.67 kg/m   Physical Exam Vitals and nursing note reviewed.  Constitutional:      Appearance: She is well-developed.  HENT:     Head: Normocephalic and atraumatic.  Cardiovascular:     Rate and Rhythm: Tachycardia present.  Pulmonary:     Effort: Pulmonary effort is normal.  Musculoskeletal:  Cervical back: Normal range of motion and neck supple.  Skin:    General: Skin is warm and dry.  Neurological:     Mental Status: She is alert and oriented to person, place, and time.     ED Results / Procedures / Treatments   Labs (all labs ordered are listed, but only abnormal results are displayed) Labs Reviewed  CBC WITH DIFFERENTIAL/PLATELET - Abnormal; Notable for the following components:      Result Value   RDW 11.1 (*)    All other components within normal limits  COMPREHENSIVE METABOLIC PANEL - Abnormal; Notable for the following components:   Potassium 2.6 (*)    All other components within normal limits  D-DIMER, QUANTITATIVE (NOT AT [facility])  POC URINE PREG, ED    EKG EKG Interpretation  Date/Time:  Monday March 30 2019 05:31:14 EST Ventricular Rate:  89 PR Interval:    QRS Duration: 108 QT Interval:  354 QTC Calculation: 431 R Axis:   56 Text Interpretation: Sinus rhythm Nonspecific T abnormalities, lateral leads No acute changes No significant change since last tracing ST depression in inferior and lateral leads Confirmed by Derwood Kaplan ([id number]) on 03/30/2019 6:17:22 AM   Radiology No results found.  Procedures Procedures (including critical care time)  Medications Ordered in ED Medications  magnesium sulfate IVPB 2 g 50 mL (2 g Intravenous New Bag/Given 03/30/19 0635)  benzonatate (TESSALON) capsule 200 mg (200 mg Oral Given 03/30/19 0610)  potassium chloride SA (KLOR-CON) CR tablet 60 mEq (60 mEq Oral Given 03/30/19 0947)  potassium chloride SA (KLOR-CON) CR tablet 40 mEq (40 mEq Oral Given 03/30/19 0700)      ED Course  I have reviewed the triage vital signs and the nursing notes.  Pertinent labs & imaging results that were available during my care of the patient were reviewed by me and considered in my medical decision making (see chart for details).    MDM Rules/Calculators/A&P                      BRIDGITTE FELICETTI was evaluated in Emergency Department on 03/30/2019 for the symptoms described in the history of present illness. She was evaluated in the context of the global COVID-19 pandemic, which necessitated consideration that the patient might be at risk for infection with the SARS-CoV-2 virus that causes COVID-19. Institutional protocols and algorithms that pertain to the evaluation of patients at risk for COVID-19 are in a state of rapid change based on information released by regulatory bodies including the CDC and federal and state organizations. These policies and algorithms were followed during the patient's care in the ED.   34 year old female comes in a chief complaint of palpitations and chest discomfort.  She is 3 weeks post Covid.  On exam she is noted to be tachycardic and slightly tachypneic.  There is no hypoxia.  Differential diagnosis includes anxiety, PE.  Doubt superimposed pneumonia.   Basic labs have been ordered and D-dimer is negative.  On reassessment patient's heart rate also improved with hydration and was down in the 80s and 90s.  Pretest probability for PE was not extremely high, therefore we will not proceed with a CT angiogram.  Patient is comfortable with the plan.  Additionally her potassium is noted to be significantly low.  She is on medications that is contributing to low potassium.  We will give her oral potassium along with IV magnesium - which would help her lungs as well.  She  still for discharge once her infusion is completed.  Final Clinical Impression(s) / ED Diagnoses Final diagnoses:  Hypokalemia  Bronchitis due to COVID-19 virus    Rx / DC  Orders ED Discharge Orders    None       Varney Biles, MD 03/30/19 [phone]

## 2019-03-30 NOTE — Discharge Instructions (Addendum)
We have evaluated you for palpitations and chest discomfort.  It appears that your symptoms could have been because of anxiety.  The work-up looking for blood clot in the lungs is negative.  We had also heard a potassium was quite low, and you have been provided with replacement.  Take your potassium as usual and try to eat foods which contain more potassium.  Please follow-up with your PCP for repeat assessment of the potassium in a week or so.Marland Kitchen

## 2019-03-30 NOTE — ED Provider Notes (Signed)
7:25 AM-checkout from Dr. Rhunette Croft to evaluate after completing treatment of magnesium sulfate, for possible bronchitis, and hypokalemia.  9:30 AM-patient is alert and comfortable.  On my entry to the room patient's heart rate is 87.  When she began to talk her heart rate immediately increased to 106.  After talking a bit, heart rate lowered to 97.  Patient has history of POTS.  She states she feels better now and is reassured since she knows that her potassium is low.  She had stopped taking her potassium over the last 3 months for various reasons.  She has plenty at home to take and she knows the type of foods that are helpful with hypokalemia.  She feels ready to go home at this time.  Findings discussed and questions answered.   Mancel Bale, MD 03/30/19 [phone]

## 2019-03-30 NOTE — ED Triage Notes (Addendum)
Pt from home c/o tachycardia starting about 45 minutes ago. Highest noted rate was 146. Pt has history of POTS. States this was under control until covid diagnosis 3 weeks ago.

## 2019-04-04 ENCOUNTER — Other Ambulatory Visit: Payer: Self-pay

## 2019-04-04 ENCOUNTER — Emergency Department (HOSPITAL_COMMUNITY)
Admission: EM | Admit: 2019-04-04 | Discharge: 2019-04-04 | Disposition: A | Discharge status: Home / Self Care | Payer: Self-pay | Attending: Emergency Medicine | Admitting: Emergency Medicine

## 2019-04-04 ENCOUNTER — Encounter (HOSPITAL_COMMUNITY): Payer: Self-pay | Admitting: Emergency Medicine

## 2019-04-04 DIAGNOSIS — E860 Dehydration: Secondary | ICD-10-CM | POA: Insufficient documentation

## 2019-04-04 DIAGNOSIS — Z79899 Other long term (current) drug therapy: Secondary | ICD-10-CM | POA: Insufficient documentation

## 2019-04-04 DIAGNOSIS — Z87891 Personal history of nicotine dependence: Secondary | ICD-10-CM | POA: Insufficient documentation

## 2019-04-04 LAB — CBC WITH DIFFERENTIAL/PLATELET
Abs Immature Granulocytes: 0.02 10*3/uL (ref 0.00–0.07)
Basophils Absolute: 0 10*3/uL (ref 0.0–0.1)
Basophils Relative: 0 %
Eosinophils Absolute: 0.1 10*3/uL (ref 0.0–0.5)
Eosinophils Relative: 1 %
HCT: 43.8 % (ref 36.0–46.0)
Hemoglobin: 15 g/dL (ref 12.0–15.0)
Immature Granulocytes: 0 %
Lymphocytes Relative: 28 %
Lymphs Abs: 2.6 10*3/uL (ref 0.7–4.0)
MCH: 32.5 pg (ref 26.0–34.0)
MCHC: 34.2 g/dL (ref 30.0–36.0)
MCV: 94.8 fL (ref 80.0–100.0)
Monocytes Absolute: 0.6 10*3/uL (ref 0.1–1.0)
Monocytes Relative: 6 %
Neutro Abs: 5.8 10*3/uL (ref 1.7–7.7)
Neutrophils Relative %: 65 %
Platelets: 391 10*3/uL (ref 150–400)
RBC: 4.62 MIL/uL (ref 3.87–5.11)
RDW: 11.2 % — ABNORMAL LOW (ref 11.5–15.5)
WBC: 9.1 10*3/uL (ref 4.0–10.5)
nRBC: 0 % (ref 0.0–0.2)

## 2019-04-04 LAB — BASIC METABOLIC PANEL
Anion gap: 11 (ref 5–15)
BUN: 15 mg/dL (ref 6–20)
CO2: 23 mmol/L (ref 22–32)
Calcium: 9 mg/dL (ref 8.9–10.3)
Chloride: 99 mmol/L (ref 98–111)
Creatinine, Ser: 0.91 mg/dL (ref 0.44–1.00)
GFR calc Af Amer: >60 mL/min (ref >60)
GFR calc non Af Amer: >60 mL/min (ref >60)
Glucose, Bld: 107 mg/dL — ABNORMAL HIGH (ref 70–99)
Potassium: 4.7 mmol/L (ref 3.5–5.1)
Sodium: 133 mmol/L — ABNORMAL LOW (ref 135–145)

## 2019-04-04 MED ORDER — SODIUM CHLORIDE 0.9 % IV BOLUS
1000.0000 mL | Freq: Once | INTRAVENOUS | Status: AC
  Administered 2019-04-04: 1000 mL via INTRAVENOUS

## 2019-04-04 MED ORDER — SODIUM CHLORIDE 0.9 % IV BOLUS
1000.0000 mL | Freq: Once | INTRAVENOUS | Status: AC
  Administered 2019-04-04: 21:00:00 1000 mL via INTRAVENOUS

## 2019-04-04 NOTE — ED Notes (Signed)
Pt out of bed to bathroom Returned and NAD noted

## 2019-04-04 NOTE — ED Notes (Signed)
Pt given snacks and po earlier   Has not touched   Reports "stuffed myself" on apricots to bring K up

## 2019-04-04 NOTE — ED Notes (Signed)
Ginger ale, crackers, and peanut butter provided to pt

## 2019-04-04 NOTE — ED Provider Notes (Addendum)
[facility] EMERGENCY DEPARTMENT Provider Note   CSN: [id number] Arrival date & time: 04/04/19  1811     History Chief Complaint  Patient presents with  . Tachycardia    Rachel Griffith is a 34 y.o. female.  HPI Patient here complaining of intermittent periods of high heartbeat, noted both at rest and with exertion and walking.  She thinks her baseline heart rate is also elevated, and all this is not typical for her, even though she has a history of postural orthopedic tachycardic syndrome (POTS).  She denies vomiting, diarrhea, anorexia, cough, shortness of breath, focal weakness or paresthesia.  She does not think her Adderall is contributing to the tachycardia.  She tried to get help from her PCP for this, but not been able to.  She has been recovering from Covid and states that she has not had any Covid symptoms for about a month.  There are no other known modifying factors.    Past Medical History:  Diagnosis Date  . Anxiety   . Back injury   . Chronic pelvic pain in female   . Endometriosis   . Endometriosis determined by laparoscopy 10/06/2014  . GERD (gastroesophageal reflux disease)   . Ovarian cyst   . Pelvic pain in female 10/06/2014  . PONV (postoperative nausea and vomiting)    pt states that with 1st arthroscopy she "passed out and couldnt left her head" but they tell me it was a combination of the pain meds and anesthesia" No problems with next surgery. Not sure of meds given in anesthesia.  Marland Kitchen POTS (postural orthostatic tachycardia syndrome)    pt reports this was diagnosed a couple of months ago by PCP (Dr Annia Friendly)  . PTSD (post-traumatic stress disorder)     Patient Active Problem List   Diagnosis Date Noted  . Left ovarian cyst 07/31/2018  . Ovarian cyst, bilateral 02/08/2016  . Postoperative abdominal pain 11/24/2014  . Status post laparoscopic assisted vaginal hysterectomy (LAVH) 11/23/2014  . Pelvic pain in female 10/06/2014  . Endometriosis determined  by laparoscopy 10/06/2014    Past Surgical History:  Procedure Laterality Date  . ABDOMINAL HYSTERECTOMY    . endometrosis  2014   fulgeration-[facility]  . KNEE SURGERY Bilateral    x2  . LAPAROSCOPIC BILATERAL SALPINGO OOPHERECTOMY Left 07/31/2018   Procedure: LAPAROSCOPIC LEFT SALPINGO OOPHORECTOMY;  Surgeon: Lazaro Arms, MD;  Location: [facility];  Service: Gynecology;  Laterality: Left;  . LAPAROSCOPY N/A 11/26/2014   Procedure: DIAGNOSTIC LAPAROSCOPY;  Surgeon: [name], MD;  Location: [facility];  Service: Gynecology;  Laterality: N/A;     OB History    Gravida  2   Para  2   Term  2   Preterm      AB      Living        SAB      TAB      Ectopic      Multiple      Live Births              Family History  Problem Relation Age of Onset  . Cancer Father        prostate  . Other Sister        IBS  . Other Brother        Lyme Disease  . Dementia Maternal Grandmother   . Parkinson's disease Maternal Grandfather   . Thrombocytopenia Sister     Social History   Tobacco Use  .  Smoking status: Former Smoker    Packs/day: 0.50    Types: Cigarettes, E-cigarettes    Quit date: 02/28/2019    Years since quitting: 0.0  . Smokeless tobacco: Never Used  Substance Use Topics  . Alcohol use: Yes    Comment: occ  . Drug use: No    Home Medications Prior to Admission medications   Medication Sig Start Date End Date Taking? Authorizing Provider  ALPRAZolam Prudy Feeler) 0.5 MG tablet Take 0.5 mg by mouth daily as needed for anxiety.    Yes [provider]  amphetamine-dextroamphetamine (ADDERALL) 20 MG tablet Take 20 mg by mouth 2 (two) times daily.   Yes [provider]  buPROPion (WELLBUTRIN XL) 150 MG 24 hr tablet Take 150 mg by mouth daily.   Yes [provider]  cyclobenzaprine (FLEXERIL) 5 MG tablet Take 5 mg by mouth at bedtime as needed.    Yes [provider]  desogestrel-ethinyl estradiol (APRI) 0.15-30 MG-MCG tablet  Take 1 tablet by mouth daily. 08/07/18  Yes Lazaro Arms, MD  fludrocortisone (FLORINEF) 0.1 MG tablet Take 300 mcg by mouth daily. 10/29/17  Yes [provider]  fluticasone (FLOVENT HFA) 110 MCG/ACT inhaler Inhale 2 puffs into the lungs 2 (two) times daily as needed.    Yes [provider]  gabapentin (NEURONTIN) 600 MG tablet Take 600-1,800 mg by mouth 3 (three) times daily. Take 600 mg at 1600, take 600 mg at 2200 and 1800 at 0400.   Yes [provider]  midodrine (PROAMATINE) 5 MG tablet Take 5 mg by mouth 3 (three) times daily.  01/28/19  Yes [provider]  ketorolac (TORADOL) 10 MG tablet Take 1 tablet (10 mg total) by mouth every 6 (six) hours. For up to 5 days Patient not taking: Reported on 08/07/2018 07/26/18   [name], MD  ketorolac (TORADOL) 10 MG tablet Take 1 tablet (10 mg total) by mouth every 8 (eight) hours as needed. Patient not taking: Reported on 03/30/2019 07/31/18   Lazaro Arms, MD  ondansetron (ZOFRAN) 8 MG tablet Take 1 tablet (8 mg total) by mouth every 8 (eight) hours as needed for nausea or vomiting. Patient not taking: Reported on 08/07/2018 07/29/18   Adline Potter, NP  oxyCODONE-acetaminophen (PERCOCET) 5-325 MG tablet Take 1-2 tablets by mouth every 4 (four) hours as needed for severe pain. Patient not taking: Reported on 08/07/2018 07/31/18   Lazaro Arms, MD  oxyCODONE-acetaminophen (PERCOCET/ROXICET) 5-325 MG tablet Take 1-2 tablets by mouth every 4 (four) hours as needed for moderate pain or severe pain. Patient not taking: Reported on 08/07/2018 07/26/18   [name], MD    Allergies    Cymbalta [duloxetine hcl], Sulfa antibiotics, Trazodone and nefazodone, and Sudafed [pseudoephedrine hcl]  Review of Systems   Review of Systems  All other systems reviewed and are negative.   Physical Exam Updated Vital Signs BP 131/75 (BP Location: Right Arm)   Pulse 65   Temp 98.4 F (36.9 C) (Oral)   Resp 17   Ht  5\' 9"  (1.753 m)   Wt 63.5 kg   LMP 09/19/2014 Comment: post LAVH  SpO2 100%   BMI 20.67 kg/m   Physical Exam Vitals and nursing note reviewed.  Constitutional:      General: She is not in acute distress.    Appearance: She is well-developed. She is not ill-appearing, toxic-appearing or diaphoretic.  HENT:     Head: Normocephalic and atraumatic.  Right Ear: External ear normal.     Left Ear: External ear normal.  Eyes:     Conjunctiva/sclera: Conjunctivae normal.     Pupils: Pupils are equal, round, and reactive to light.  Neck:     Trachea: Phonation normal.  Cardiovascular:     Rate and Rhythm: Normal rate and regular rhythm.     Heart sounds: Normal heart sounds.  Pulmonary:     Effort: Pulmonary effort is normal.     Breath sounds: Normal breath sounds.  Abdominal:     General: There is no distension.     Palpations: Abdomen is soft.     Tenderness: There is no abdominal tenderness.  Musculoskeletal:        General: Normal range of motion.     Cervical back: Normal range of motion and neck supple.  Skin:    General: Skin is warm and dry.  Neurological:     Mental Status: She is alert and oriented to person, place, and time.     Cranial Nerves: No cranial nerve deficit.     Sensory: No sensory deficit.     Motor: No abnormal muscle tone.     Coordination: Coordination normal.  Psychiatric:        Behavior: Behavior normal.        Thought Content: Thought content normal.        Judgment: Judgment normal.     Comments: Anxious     ED Results / Procedures / Treatments   Labs (all labs ordered are listed, but only abnormal results are displayed) Labs Reviewed  CBC WITH DIFFERENTIAL/PLATELET - Abnormal; Notable for the following components:      Result Value   RDW 11.2 (*)    All other components within normal limits  BASIC METABOLIC PANEL - Abnormal; Notable for the following components:   Sodium 133 (*)    Glucose, Bld 107 (*)    All other components  within normal limits    EKG EKG Interpretation  Date/Time:  Saturday April 04 2019 18:26:22 EST Ventricular Rate:  90 PR Interval:    QRS Duration: 80 QT Interval:  338 QTC Calculation: 414 R Axis:   76 Text Interpretation: Sinus rhythm Right atrial enlargement Baseline wander in lead(s) I III aVL since last tracing no significant change Confirmed by Daleen Bo [phone]) on 04/04/2019 6:33:34 PM   Radiology No results found.  Procedures Procedures (including critical care time)  Medications Ordered in ED Medications  sodium chloride 0.9 % bolus 1,000 mL (0 mLs Intravenous Stopped 04/04/19 2010)  sodium chloride 0.9 % bolus 1,000 mL (0 mLs Intravenous Stopped 04/04/19 2146)    ED Course  I have reviewed the triage vital signs and the nursing notes.  Pertinent labs & imaging results that were available during my care of the patient were reviewed by me and considered in my medical decision making (see chart for details).  Clinical Course as of Apr 03 2148  Sat Apr 04, 2019  1850 Orthostatic blood pressure and pulse was done at 1847 are positive for orthostasis.   [EW]  1954 Except sodium low, glucose high  Basic metabolic panel(!) [EW]  7017 Normal except RDW is low  CBC with Differential(!) [EW]    Clinical Course User Index [EW] Daleen Bo, MD   MDM Rules/Calculators/A&P                       Patient Vitals for the past 24 hrs:  BP Temp Temp src Pulse Resp SpO2 Height Weight  04/04/19 2149 131/75 -- -- 65 17 100 % -- --  04/04/19 2148 131/75 -- -- 81 (!) 22 100 % -- --  04/04/19 2130 130/78 -- -- 65 17 100 % -- --  04/04/19 2115 -- -- -- 79 18 100 % -- --  04/04/19 2100 134/84 -- -- 65 20 100 % -- --  04/04/19 2030 139/85 -- -- 77 16 100 % -- --  04/04/19 2015 -- -- -- 73 16 100 % -- --  04/04/19 2010 -- -- -- 71 (!) 27 100 % -- --  04/04/19 2000 135/83 -- -- 67 16 100 % -- --  04/04/19 1955 -- -- -- 70 (!) 24 100 % -- --  04/04/19 1930 (!) 142/93 -- --  -- (!) 29 -- -- --  04/04/19 1915 -- -- -- -- (!) 26 -- -- --  04/04/19 1910 -- -- -- 91 16 100 % -- --  04/04/19 1900 (!) 134/100 -- -- 94 18 100 % -- --  04/04/19 1848 -- -- -- 82 (!) 31 100 % -- --  04/04/19 1830 (!) 138/100 -- -- 95 (!) 27 100 % -- --  04/04/19 1826 (!) 143/104 98.4 F (36.9 C) Oral (!) 105 17 100 % -- --  04/04/19 1823 -- -- -- -- -- -- 5\' 9"  (1.753 m) 63.5 kg    9:43 PM Reevaluation with update and discussion. After initial assessment and treatment, an updated evaluation reveals patient's heart rate is now 67.  She states she feels much better after receiving 2 L of IV saline.  Findings discussed questions answered.   Medical Decision Making: Tachycardia likely related to volume depletion.  Patient is recovering for Covid, is 4 weeks status post feeling better.  Doubt ongoing Covid infection.  Doubt hemodynamic instability or metabolic instability.  Stable for discharge.  CRITICAL CARE-no Performed by: Mancel Bale   Nursing Notes Reviewed/ Care Coordinated Applicable Imaging Reviewed Interpretation of Laboratory Data incorporated into ED treatment  The patient appears reasonably screened and/or stabilized for discharge and I doubt any other medical condition or other [facility] requiring further screening, evaluation, or treatment in the ED at this time prior to discharge.  Plan: Home Medications-continue usual medications; Home Treatments-rest, fluids; return here if the recommended treatment, does not improve the symptoms; Recommended follow up-PCP, as needed    Final Clinical Impression(s) / ED Diagnoses Final diagnoses:  Dehydration    Rx / DC Orders ED Discharge Orders    None       [facility], MD 04/04/19 2149    2150, MD 04/04/19 2150

## 2019-04-04 NOTE — ED Notes (Signed)
Seen here recently  K noted to be low  Hx of POTs Dx'd by her PCP  Has not seen cardiologist as she has  No insurance   Reports tachy since eval here and cannot stand up long enough to do ADLs without feeling she will pass out

## 2019-04-04 NOTE — ED Triage Notes (Signed)
Pt states that she has been having tachy heart rate her heart rate has been up to 160's and 100 resting

## 2019-04-04 NOTE — Discharge Instructions (Addendum)
Try to drink more fluids and eat a regular diet.  See your doctor for problems.

## 2019-04-04 NOTE — ED Notes (Addendum)
Ortho   Lying  100 per cent pulse ox room air  134/83   92 HR, 88 pulse 16R  Sitting  100 per cent POx RA 124/95  101 HR, 99 pulse 20  Standing  100 POx RA 120/93 140 HR, 132 P 24

## 2019-04-04 NOTE — ED Notes (Signed)
Lying   100% RA P/HR 69 R 17  128/78  Sitting   100% RA P/HR 73 R 23 130/86  Standing  100% RA P 84 HR 87 R 24  109/88

## 2019-04-19 ENCOUNTER — Other Ambulatory Visit: Payer: Self-pay

## 2019-04-19 ENCOUNTER — Ambulatory Visit: Payer: Self-pay | Attending: Internal Medicine

## 2019-04-19 DIAGNOSIS — Z23 Encounter for immunization: Secondary | ICD-10-CM | POA: Insufficient documentation

## 2019-04-19 NOTE — Progress Notes (Signed)
   Covid-19 Vaccination Clinic  Name:  COLLENE MASSIMINO    MRN: [id number] DOB: December 02, 1985  04/19/2019  Ms. Schuessler was observed post Covid-19 immunization for 15 minutes without incidence. She was provided with Vaccine Information Sheet and instruction to access the V-Safe system.   Ms. Mathurin was instructed to call 911 with any severe reactions post vaccine: Marland Kitchen Difficulty breathing  . Swelling of your face and throat  . A fast heartbeat  . A bad rash all over your body  . Dizziness and weakness    Immunizations Administered    Name Date Dose VIS Date Route   Pfizer COVID-19 Vaccine 04/19/2019  8:20 AM 0.3 mL 02/06/2019 Intramuscular   Manufacturer: [employer], [employer]   Lot: [id number]   NDC: [address]-2

## 2019-05-12 ENCOUNTER — Ambulatory Visit: Payer: Self-pay

## 2019-05-19 ENCOUNTER — Ambulatory Visit: Payer: Self-pay | Attending: Internal Medicine

## 2019-05-19 DIAGNOSIS — Z23 Encounter for immunization: Secondary | ICD-10-CM

## 2019-05-19 NOTE — Progress Notes (Signed)
   Covid-19 Vaccination Clinic  Name:  Rachel Griffith    MRN: [id number] DOB: 17-Nov-1985  05/19/2019  Ms. Sailer was observed post Covid-19 immunization for 15 minutes without incident. She was provided with Vaccine Information Sheet and instruction to access the V-Safe system.   Ms. Doig was instructed to call 911 with any severe reactions post vaccine: Marland Kitchen Difficulty breathing  . Swelling of face and throat  . A fast heartbeat  . A bad rash all over body  . Dizziness and weakness   Immunizations Administered    Name Date Dose VIS Date Route   Pfizer COVID-19 Vaccine 05/19/2019 12:24 PM 0.3 mL 02/06/2019 Intramuscular   Manufacturer: [employer], [employer]   Lot: [id number]   NDC: [address]-9

## 2019-05-30 ENCOUNTER — Emergency Department (HOSPITAL_COMMUNITY)
Admission: EM | Admit: 2019-05-30 | Discharge: 2019-05-31 | Disposition: A | Discharge status: Home / Self Care | Payer: Self-pay | Attending: Emergency Medicine | Admitting: Emergency Medicine

## 2019-05-30 ENCOUNTER — Encounter (HOSPITAL_COMMUNITY): Payer: Self-pay

## 2019-05-30 ENCOUNTER — Other Ambulatory Visit: Payer: Self-pay

## 2019-05-30 DIAGNOSIS — Z79899 Other long term (current) drug therapy: Secondary | ICD-10-CM | POA: Insufficient documentation

## 2019-05-30 DIAGNOSIS — R531 Weakness: Secondary | ICD-10-CM | POA: Insufficient documentation

## 2019-05-30 DIAGNOSIS — R110 Nausea: Secondary | ICD-10-CM | POA: Insufficient documentation

## 2019-05-30 DIAGNOSIS — Z87891 Personal history of nicotine dependence: Secondary | ICD-10-CM | POA: Insufficient documentation

## 2019-05-30 LAB — CBC WITH DIFFERENTIAL/PLATELET
Abs Immature Granulocytes: 0.02 10*3/uL (ref 0.00–0.07)
Basophils Absolute: 0 10*3/uL (ref 0.0–0.1)
Basophils Relative: 1 %
Eosinophils Absolute: 0.1 10*3/uL (ref 0.0–0.5)
Eosinophils Relative: 1 %
HCT: 39.5 % (ref 36.0–46.0)
Hemoglobin: 13.4 g/dL (ref 12.0–15.0)
Immature Granulocytes: 0 %
Lymphocytes Relative: 33 %
Lymphs Abs: 2.6 10*3/uL (ref 0.7–4.0)
MCH: 32.2 pg (ref 26.0–34.0)
MCHC: 33.9 g/dL (ref 30.0–36.0)
MCV: 95 fL (ref 80.0–100.0)
Monocytes Absolute: 0.4 10*3/uL (ref 0.1–1.0)
Monocytes Relative: 5 %
Neutro Abs: 4.8 10*3/uL (ref 1.7–7.7)
Neutrophils Relative %: 60 %
Platelets: 300 10*3/uL (ref 150–400)
RBC: 4.16 MIL/uL (ref 3.87–5.11)
RDW: 11.6 % (ref 11.5–15.5)
WBC: 7.9 10*3/uL (ref 4.0–10.5)
nRBC: 0 % (ref 0.0–0.2)

## 2019-05-30 LAB — COMPREHENSIVE METABOLIC PANEL
ALT: 22 U/L (ref 0–44)
AST: 19 U/L (ref 15–41)
Albumin: 4.3 g/dL (ref 3.5–5.0)
Alkaline Phosphatase: 46 U/L (ref 38–126)
Anion gap: 10 (ref 5–15)
BUN: 15 mg/dL (ref 6–20)
CO2: 25 mmol/L (ref 22–32)
Calcium: 9.3 mg/dL (ref 8.9–10.3)
Chloride: 100 mmol/L (ref 98–111)
Creatinine, Ser: 1 mg/dL (ref 0.44–1.00)
GFR calc Af Amer: >60 mL/min (ref >60)
GFR calc non Af Amer: >60 mL/min (ref >60)
Glucose, Bld: 96 mg/dL (ref 70–99)
Potassium: 4 mmol/L (ref 3.5–5.1)
Sodium: 135 mmol/L (ref 135–145)
Total Bilirubin: 0.6 mg/dL (ref 0.3–1.2)
Total Protein: 8.1 g/dL (ref 6.5–8.1)

## 2019-05-30 LAB — LIPASE, BLOOD: Lipase: 31 U/L (ref 11–51)

## 2019-05-30 MED ORDER — SODIUM CHLORIDE 0.9 % IV BOLUS
1000.0000 mL | Freq: Once | INTRAVENOUS | Status: AC
  Administered 2019-05-30: 1000 mL via INTRAVENOUS

## 2019-05-30 MED ORDER — SODIUM CHLORIDE 0.9 % IV BOLUS
1000.0000 mL | Freq: Once | INTRAVENOUS | Status: AC
  Administered 2019-05-30: 23:00:00 1000 mL via INTRAVENOUS

## 2019-05-30 NOTE — ED Triage Notes (Signed)
Pt presents from home via POV c/o persistent nausea that has been going on for apprx 1 year. Pt reports difficulty swallowing liquids or eating for and every time she trys she feels sick. Pt reports these symptoms always begin in mid-afternoon. Pt also reports orthostatic tachycardia.

## 2019-05-31 LAB — URINALYSIS, ROUTINE W REFLEX MICROSCOPIC
Bilirubin Urine: NEGATIVE
Glucose, UA: NEGATIVE mg/dL
Hgb urine dipstick: NEGATIVE
Ketones, ur: NEGATIVE mg/dL
Leukocytes,Ua: NEGATIVE
Nitrite: NEGATIVE
Protein, ur: NEGATIVE mg/dL
Specific Gravity, Urine: 1.014 (ref 1.005–1.030)
pH: 5 (ref 5.0–8.0)

## 2019-05-31 MED ORDER — ONDANSETRON HCL 4 MG/2ML IJ SOLN
4.0000 mg | Freq: Once | INTRAMUSCULAR | Status: AC
  Administered 2019-05-31: 02:00:00 4 mg via INTRAVENOUS
  Filled 2019-05-31: qty 2

## 2019-05-31 MED ORDER — ONDANSETRON 4 MG PO TBDP: 4.0000 mg | ORAL_TABLET | Freq: Three times a day (TID) | ORAL | 0 refills | Status: DC | PRN

## 2019-05-31 NOTE — ED Notes (Signed)
Pt given water for fluid challenge 

## 2019-05-31 NOTE — Discharge Instructions (Addendum)
Your lab tests tonight are normal with no electrolyte abnormalities.  Make sure you are drinking plenty of fluids.  Continue using your Zofran as needed for your nausea symptoms.  Plan to follow-up with Dr. Margo Aye as you have scheduled.

## 2019-06-01 NOTE — ED Provider Notes (Signed)
[facility] EMERGENCY DEPARTMENT Provider Note   CSN: [id number] Arrival date & time: 05/30/19  2129     History Chief Complaint  Patient presents with  . Nausea    Rachel Griffith is a 34 y.o. female with a history most significant for POTS syndrome presenting with concern of dehydration secondary to chronic and persistent nausea with poor p.o. intake.  She states that she has chronic nausea for at least the past year stating she generally wakes up feeling well in the morning but by mid afternoon develops pretty significant nausea, usually without emesis.  However she has great difficulty with appetite and maintaining fluid intake secondary to this complaint.  She denies abdominal pain, also no fevers or chills.  She is having increasing lightheadedness with positional changes, denies headache, vision changes or syncope.  She has noted significant tachycardia with standing and reports generalized fatigue.  She states Zofran helps control her nausea symptoms and she is almost out of this medication.  She is planning to establish care with a new PCP Dr. Nevada Crane in 3 days.  She feels she needs IV fluids at this time.  HPI     Past Medical History:  Diagnosis Date  . Anxiety   . Back injury   . Chronic pelvic pain in female   . Endometriosis   . Endometriosis determined by laparoscopy 10/06/2014  . Ovarian cyst   . Pelvic pain in female 10/06/2014  . PONV (postoperative nausea and vomiting)    pt states that with 1st arthroscopy she "passed out and couldnt left her head" but they tell me it was a combination of the pain meds and anesthesia" No problems with next surgery. Not sure of meds given in anesthesia.  Marland Kitchen POTS (postural orthostatic tachycardia syndrome)    pt reports this was diagnosed a couple of months ago by PCP (Dr Higinio Plan)  . PTSD (post-traumatic stress disorder)     Patient Active Problem List   Diagnosis Date Noted  . Left ovarian cyst 07/31/2018  . Ovarian cyst, bilateral  02/08/2016  . Postoperative abdominal pain 11/24/2014  . Status post laparoscopic assisted vaginal hysterectomy (LAVH) 11/23/2014  . Pelvic pain in female 10/06/2014  . Endometriosis determined by laparoscopy 10/06/2014    Past Surgical History:  Procedure Laterality Date  . ABDOMINAL HYSTERECTOMY    . endometrosis  2014   fulgeration-[facility]  . KNEE SURGERY Bilateral    x2  . LAPAROSCOPIC BILATERAL SALPINGO OOPHERECTOMY Left 07/31/2018   Procedure: LAPAROSCOPIC LEFT SALPINGO OOPHORECTOMY;  Surgeon: Florian Buff, MD;  Location: [facility];  Service: Gynecology;  Laterality: Left;  . LAPAROSCOPY N/A 11/26/2014   Procedure: DIAGNOSTIC LAPAROSCOPY;  Surgeon: Jonnie Kind, MD;  Location: [facility];  Service: Gynecology;  Laterality: N/A;     OB History    Gravida  2   Para  2   Term  2   Preterm      AB      Living        SAB      TAB      Ectopic      Multiple      Live Births              Family History  Problem Relation Age of Onset  . Cancer Father        prostate  . Other Sister        IBS  . Other Brother  Lyme Disease  . Dementia Maternal Grandmother   . Parkinson's disease Maternal Grandfather   . Thrombocytopenia Sister     Social History   Tobacco Use  . Smoking status: Former Smoker    Packs/day: 0.50    Types: Cigarettes, E-cigarettes    Quit date: 02/28/2019    Years since quitting: 0.2  . Smokeless tobacco: Never Used  Substance Use Topics  . Alcohol use: Not Currently    Comment: occ  . Drug use: No    Home Medications Prior to Admission medications   Medication Sig Start Date End Date Taking? Authorizing Provider  ALPRAZolam Duanne Moron) 0.5 MG tablet Take 0.5 mg by mouth daily as needed for anxiety.     [provider]  amphetamine-dextroamphetamine (ADDERALL) 20 MG tablet Take 20 mg by mouth 2 (two) times daily.    [provider]  buPROPion (WELLBUTRIN XL) 150 MG 24 hr tablet Take 150 mg by mouth daily.     [provider]  cyclobenzaprine (FLEXERIL) 5 MG tablet Take 5 mg by mouth at bedtime as needed.     [provider]  desogestrel-ethinyl estradiol (APRI) 0.15-30 MG-MCG tablet Take 1 tablet by mouth daily. 08/07/18   Florian Buff, MD  fludrocortisone (FLORINEF) 0.1 MG tablet Take 300 mcg by mouth daily. 10/29/17   [provider]  fluticasone (FLOVENT HFA) 110 MCG/ACT inhaler Inhale 2 puffs into the lungs 2 (two) times daily as needed.     [provider]  gabapentin (NEURONTIN) 600 MG tablet Take 600-1,800 mg by mouth 3 (three) times daily. Take 600 mg at 1600, take 600 mg at 2200 and 1800 at 0400.    [provider]  ketorolac (TORADOL) 10 MG tablet Take 1 tablet (10 mg total) by mouth every 6 (six) hours. For up to 5 days Patient not taking: Reported on 08/07/2018 07/26/18   Jonnie Kind, MD  ketorolac (TORADOL) 10 MG tablet Take 1 tablet (10 mg total) by mouth every 8 (eight) hours as needed. Patient not taking: Reported on 03/30/2019 07/31/18   Florian Buff, MD  midodrine (PROAMATINE) 5 MG tablet Take 5 mg by mouth 3 (three) times daily.  01/28/19   [provider]  ondansetron (ZOFRAN ODT) 4 MG disintegrating tablet Take 1 tablet (4 mg total) by mouth every 8 (eight) hours as needed for nausea or vomiting. 05/31/19   Evalee Jefferson, PA-C  ondansetron (ZOFRAN) 8 MG tablet Take 1 tablet (8 mg total) by mouth every 8 (eight) hours as needed for nausea or vomiting. Patient not taking: Reported on 08/07/2018 07/29/18   Estill Dooms, NP  oxyCODONE-acetaminophen (PERCOCET) 5-325 MG tablet Take 1-2 tablets by mouth every 4 (four) hours as needed for severe pain. Patient not taking: Reported on 08/07/2018 07/31/18   Florian Buff, MD  oxyCODONE-acetaminophen (PERCOCET/ROXICET) 5-325 MG tablet Take 1-2 tablets by mouth every 4 (four) hours as needed for moderate pain or severe pain. Patient not taking: Reported on 08/07/2018 07/26/18   Jonnie Kind, MD     Allergies    Almond meal, Cymbalta [duloxetine hcl], Sulfa antibiotics, Trazodone and nefazodone, and Sudafed [pseudoephedrine hcl]  Review of Systems   Review of Systems  Constitutional: Positive for appetite change and fatigue. Negative for chills and fever.  HENT: Negative.   Eyes: Negative.   Respiratory: Negative for chest tightness and shortness of breath.   Cardiovascular: Positive for palpitations. Negative for chest pain.  Gastrointestinal: Positive for nausea. Negative for  abdominal pain and vomiting.  Genitourinary: Negative.  Negative for dysuria.  Musculoskeletal: Negative for arthralgias, joint swelling and neck pain.  Skin: Negative.  Negative for rash and wound.  Neurological: Positive for light-headedness. Negative for dizziness, weakness, numbness and headaches.  Psychiatric/Behavioral: Negative.     Physical Exam Updated Vital Signs BP 126/75   Pulse 78   Temp 97.9 F (36.6 C) (Oral)   Resp 16   Ht _0  (1.753 m)   Wt 63.5 kg   LMP 09/19/2014 Comment: post LAVH  SpO2 100%   BMI 20.67 kg/m   Physical Exam Vitals and nursing note reviewed.  Constitutional:      Appearance: She is well-developed.  HENT:     Head: Normocephalic and atraumatic.     Mouth/Throat:     Mouth: Mucous membranes are moist.     Pharynx: Oropharynx is clear.  Eyes:     Conjunctiva/sclera: Conjunctivae normal.  Cardiovascular:     Rate and Rhythm: Normal rate and regular rhythm.     Heart sounds: Normal heart sounds.  Pulmonary:     Effort: Pulmonary effort is normal.     Breath sounds: Normal breath sounds. No wheezing.  Abdominal:     General: Bowel sounds are normal. There is no distension.     Palpations: Abdomen is soft.     Tenderness: There is no abdominal tenderness. There is no guarding.  Musculoskeletal:        General: Normal range of motion.     Cervical back: Normal range of motion.  Skin:    General: Skin is warm and dry.  Neurological:     General:  No focal deficit present.     Mental Status: She is alert and oriented to person, place, and time.     ED Results / Procedures / Treatments   Labs (all labs ordered are listed, but only abnormal results are displayed) Labs Reviewed  CBC WITH DIFFERENTIAL/PLATELET  COMPREHENSIVE METABOLIC PANEL  LIPASE, BLOOD  URINALYSIS, ROUTINE W REFLEX MICROSCOPIC    EKG None  Radiology No results found.  Procedures Procedures (including critical care time)  Medications Ordered in ED Medications  sodium chloride 0.9 % bolus 1,000 mL (0 mLs Intravenous Stopped 05/31/19 0148)  sodium chloride 0.9 % bolus 1,000 mL (0 mLs Intravenous Stopped 05/31/19 0148)  ondansetron (ZOFRAN) injection 4 mg (4 mg Intravenous Given 05/31/19 0157)    ED Course  I have reviewed the triage vital signs and the nursing notes.  Pertinent labs & imaging results that were available during my care of the patient were reviewed by me and considered in my medical decision making (see chart for details).    MDM Rules/Calculators/A&P                      Patient with chronic nausea of unclear etiology which is been a longstanding symptom.  Her labs were reviewed and discussed, all normal today with no elevation in BUN or creatinine levels.  Her CBC was also normal with a WBC count of 7.9.  Her urine was also clean with no ketones.  C-Met also normal with normal range LFTs and electrolytes.  She was given 2 L of saline while here, she also tolerated a p.o. challenge after receiving Zofran.  Orthostatics were initially obtained in she was symptomatic and was orthostatic from lying to standing prior to receiving her fluids.  She felt improved at time of discharge.  We discussed steroid use, she  states she prefers no steroids.  She no longer uses Florinef due to intolerance, she uses midodrine for her POTS symptoms.  Planned follow-up with Dr. Nevada Crane in 3 days. Final Clinical Impression(s) / ED Diagnoses Final diagnoses:  Chronic  nausea  Weakness    Rx / DC Orders ED Discharge Orders         Ordered    ondansetron (ZOFRAN ODT) 4 MG disintegrating tablet  Every 8 hours PRN     05/31/19 0142           Evalee Jefferson, PA-C 06/01/19 Joseph Pierini, MD 06/04/19 [phone]

## 2019-06-08 ENCOUNTER — Emergency Department (HOSPITAL_COMMUNITY)
Admission: EM | Admit: 2019-06-08 | Discharge: 2019-06-09 | Disposition: A | Discharge status: Home / Self Care | Payer: Self-pay | Attending: Emergency Medicine | Admitting: Emergency Medicine

## 2019-06-08 ENCOUNTER — Encounter (HOSPITAL_COMMUNITY): Payer: Self-pay | Admitting: Emergency Medicine

## 2019-06-08 ENCOUNTER — Emergency Department (HOSPITAL_COMMUNITY): Payer: Self-pay

## 2019-06-08 ENCOUNTER — Other Ambulatory Visit: Payer: Self-pay

## 2019-06-08 DIAGNOSIS — Z87891 Personal history of nicotine dependence: Secondary | ICD-10-CM | POA: Insufficient documentation

## 2019-06-08 DIAGNOSIS — R1084 Generalized abdominal pain: Secondary | ICD-10-CM | POA: Insufficient documentation

## 2019-06-08 DIAGNOSIS — Z79899 Other long term (current) drug therapy: Secondary | ICD-10-CM | POA: Insufficient documentation

## 2019-06-08 DIAGNOSIS — R102 Pelvic and perineal pain: Secondary | ICD-10-CM | POA: Insufficient documentation

## 2019-06-08 LAB — CBC
HCT: 38.7 % (ref 36.0–46.0)
Hemoglobin: 13.1 g/dL (ref 12.0–15.0)
MCH: 32.4 pg (ref 26.0–34.0)
MCHC: 33.9 g/dL (ref 30.0–36.0)
MCV: 95.8 fL (ref 80.0–100.0)
Platelets: 297 10*3/uL (ref 150–400)
RBC: 4.04 MIL/uL (ref 3.87–5.11)
RDW: 11.7 % (ref 11.5–15.5)
WBC: 9.3 10*3/uL (ref 4.0–10.5)
nRBC: 0 % (ref 0.0–0.2)

## 2019-06-08 LAB — COMPREHENSIVE METABOLIC PANEL
ALT: 21 U/L (ref 0–44)
AST: 14 U/L — ABNORMAL LOW (ref 15–41)
Albumin: 4 g/dL (ref 3.5–5.0)
Alkaline Phosphatase: 39 U/L (ref 38–126)
Anion gap: 9 (ref 5–15)
BUN: 11 mg/dL (ref 6–20)
CO2: 23 mmol/L (ref 22–32)
Calcium: 9 mg/dL (ref 8.9–10.3)
Chloride: 103 mmol/L (ref 98–111)
Creatinine, Ser: 0.85 mg/dL (ref 0.44–1.00)
GFR calc Af Amer: >60 mL/min (ref >60)
GFR calc non Af Amer: >60 mL/min (ref >60)
Glucose, Bld: 97 mg/dL (ref 70–99)
Potassium: 4.1 mmol/L (ref 3.5–5.1)
Sodium: 135 mmol/L (ref 135–145)
Total Bilirubin: 0.4 mg/dL (ref 0.3–1.2)
Total Protein: 7.6 g/dL (ref 6.5–8.1)

## 2019-06-08 LAB — HCG, QUANTITATIVE, PREGNANCY: hCG, Beta Chain, Quant, S: <1 m[IU]/mL (ref <5)

## 2019-06-08 LAB — LIPASE, BLOOD: Lipase: 41 U/L (ref 11–51)

## 2019-06-08 IMAGING — CT CT ABD-PELV W/ CM
1 of 2 series · 14 of 32 positions shown, 19 images · IV contrast (Omnipaque or Isovue)
Comparison: None.

CLINICAL DATA: Right lower quadrant abdominal pain

EXAM:
CT ABDOMEN AND PELVIS WITH CONTRAST
TECHNIQUE: Multidetector CT imaging of the abdomen and pelvis was performed
using the standard protocol following bolus administration of
intravenous contrast.
CONTRAST:  100mL OMNIPAQUE IOHEXOL 300 MG/ML  SOLN

[Series 2: axial st · axial · 0.59mm/px · z∈[-384,-8]mm · 14 of 87 slices shown, 19 images]
[im 6/87  soft-tissue]
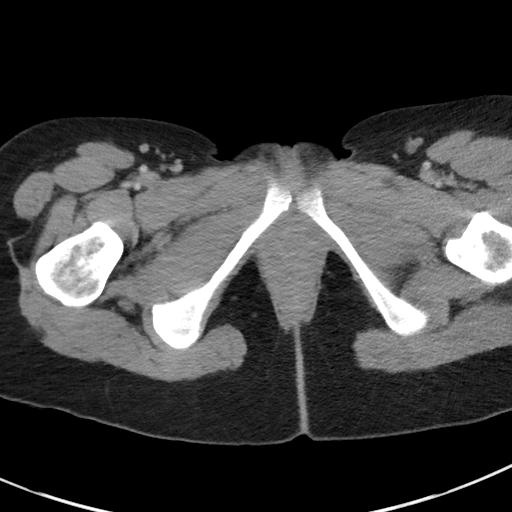
[im 6/87  bone]
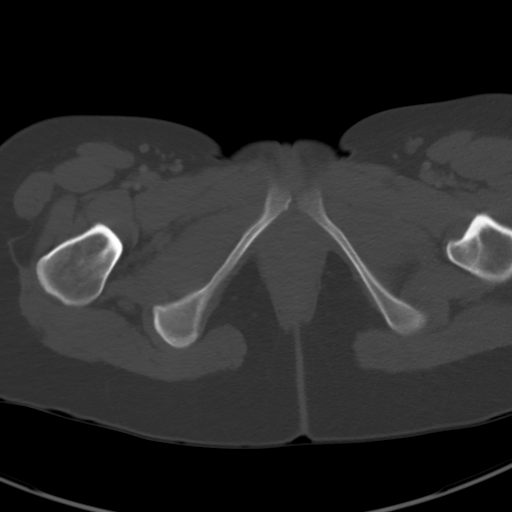
[im 11/87  soft-tissue]
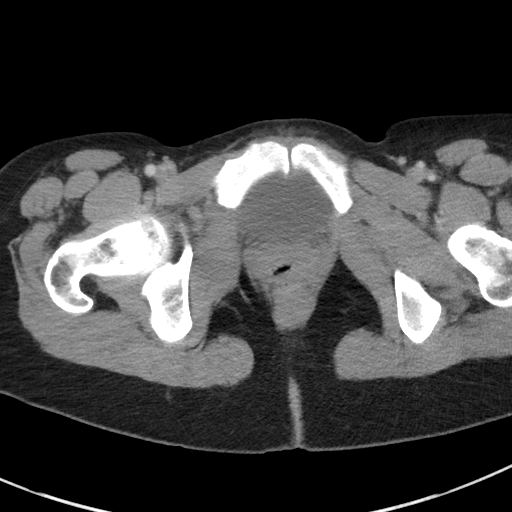
[im 21/87  soft-tissue]
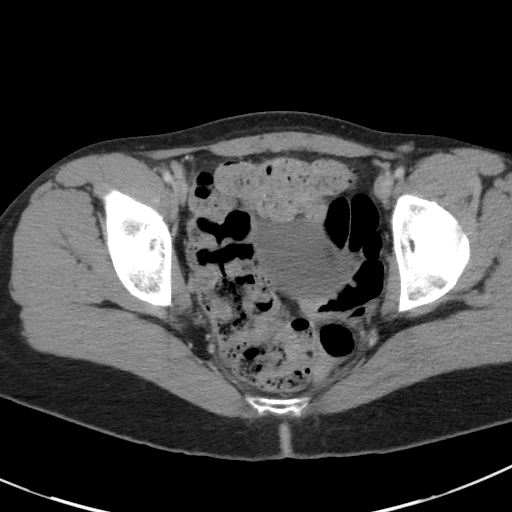
[im 26/87  soft-tissue]
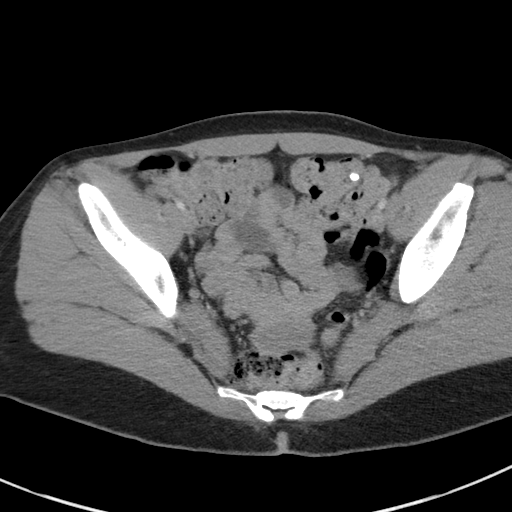
[im 31/87  soft-tissue]
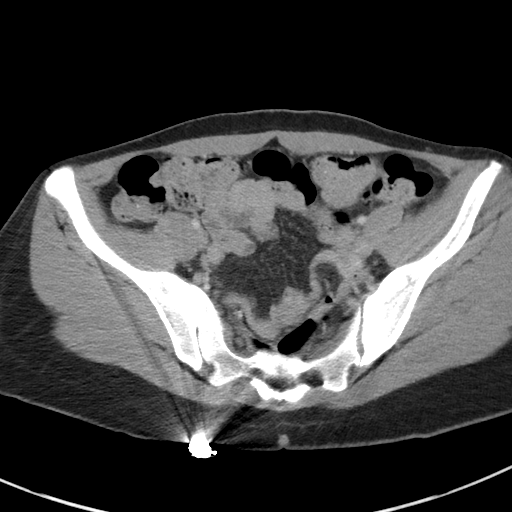
[im 36/87  soft-tissue]
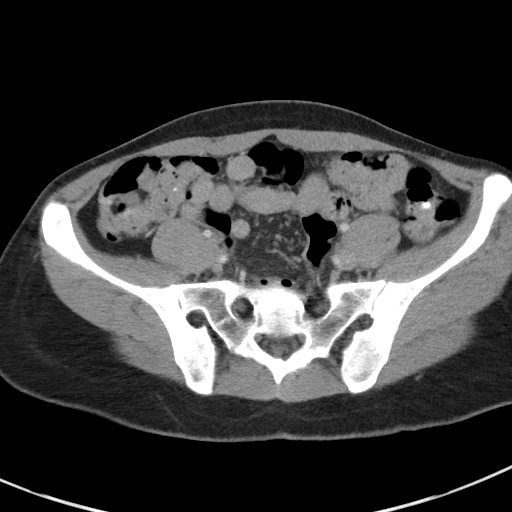
[im 46/87  soft-tissue]
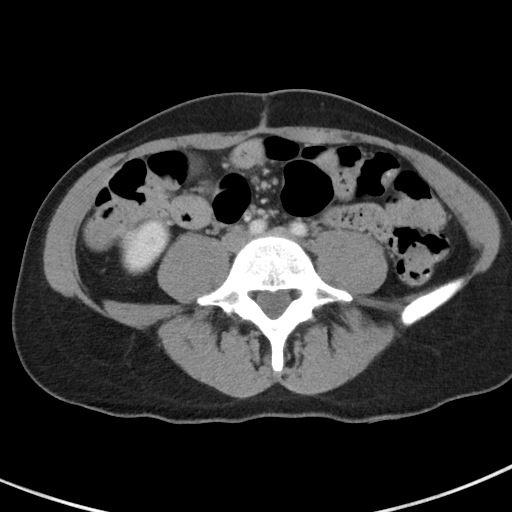
[im 51/87  soft-tissue]
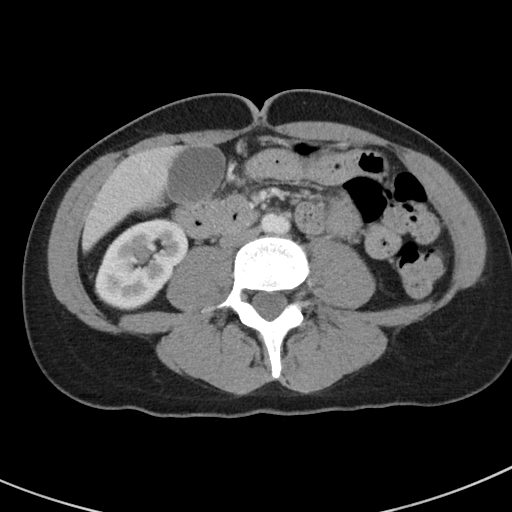
[im 56/87  soft-tissue]
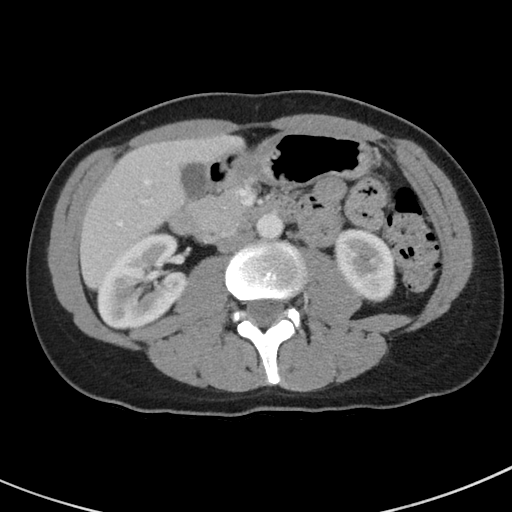
[im 56/87  bone]
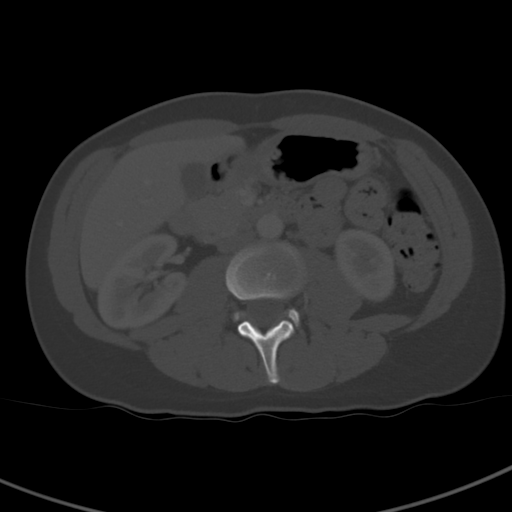
[im 61/87  soft-tissue]
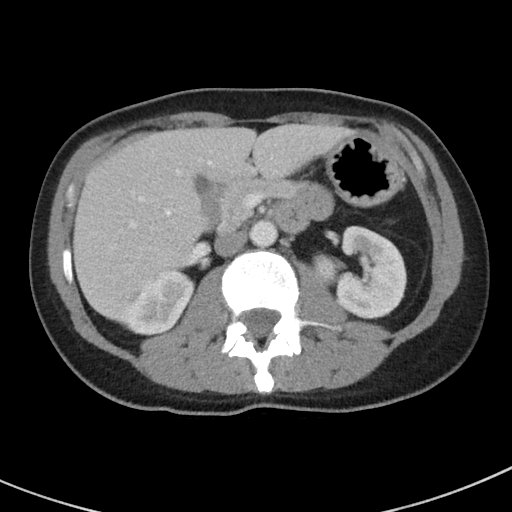
[im 66/87  soft-tissue]
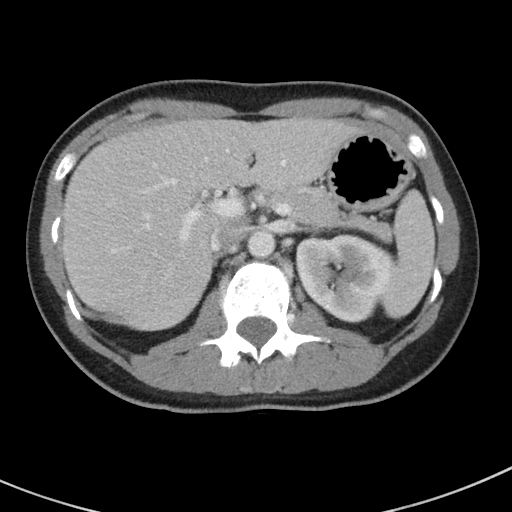
[im 66/87  lung]
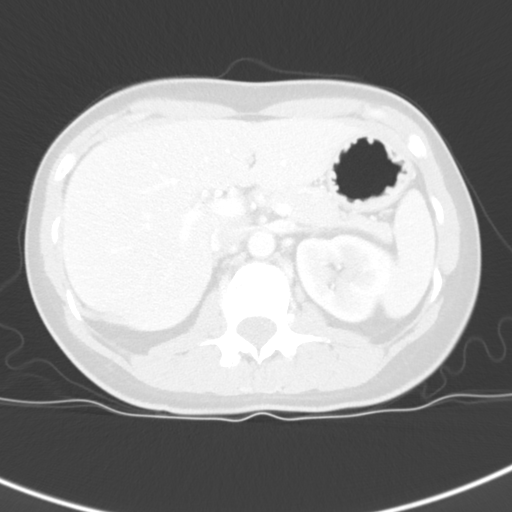
[im 71/87  lung]
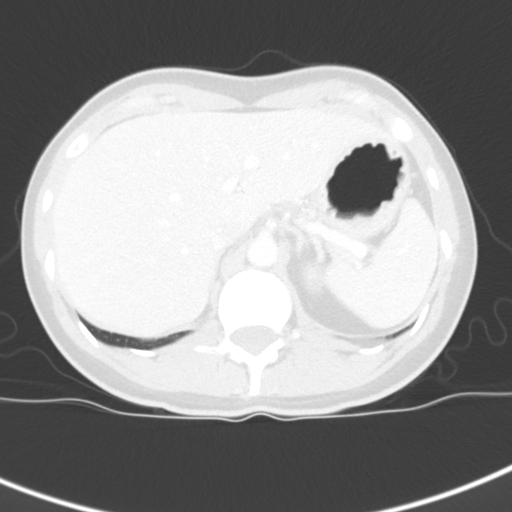
[im 76/87  soft-tissue]
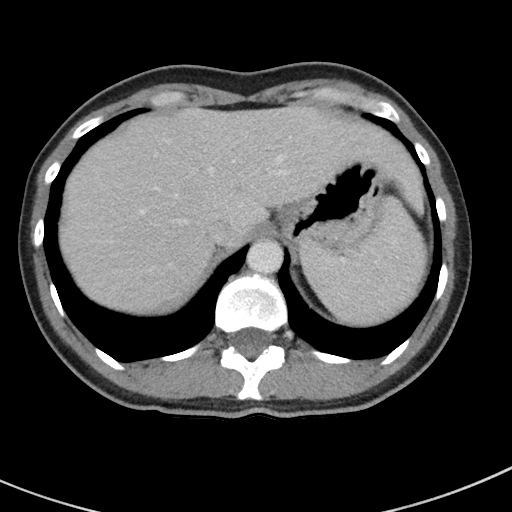
[im 76/87  lung]
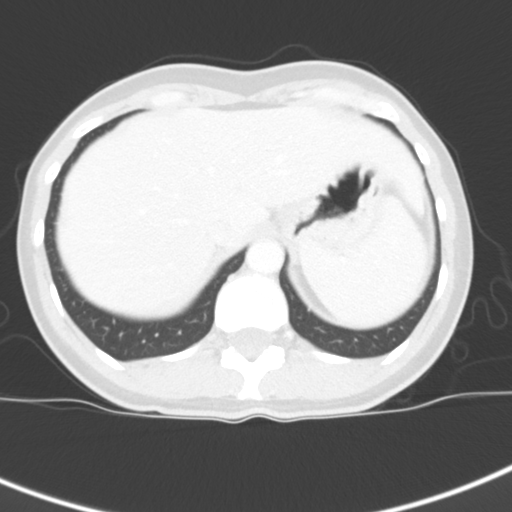
[im 81/87  soft-tissue]
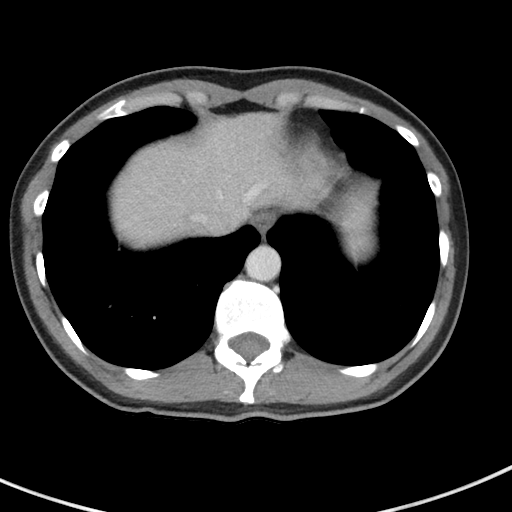
[im 81/87  lung]
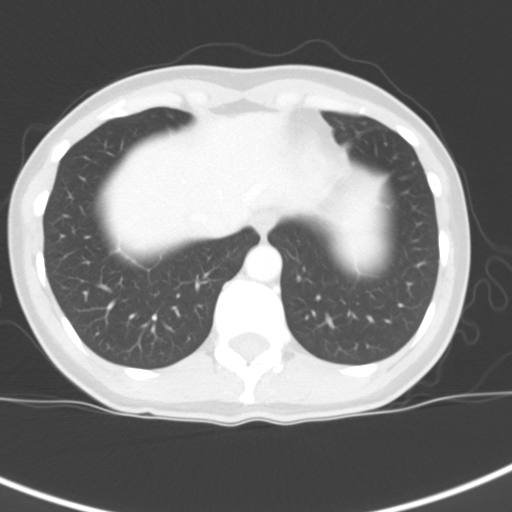

[14 of 32 positions shown; findings below may reference images not displayed]

FINDINGS: LOWER CHEST: Normal.

HEPATOBILIARY: Normal hepatic contours. No intra- or extrahepatic
biliary dilatation. The gallbladder is normal.

PANCREAS: Normal pancreas. No ductal dilatation or peripancreatic
fluid collection.

SPLEEN: Normal.

ADRENALS/URINARY TRACT: The adrenal glands are normal. No
hydronephrosis, nephroureterolithiasis or solid renal mass. The
urinary bladder is normal for degree of distention

STOMACH/BOWEL: There is no hiatal hernia. Normal duodenal course and
caliber. No small bowel dilatation or inflammation. No focal colonic
abnormality. The appendix is not definitively visualized. No right
lower quadrant inflammation or free fluid.

VASCULAR/LYMPHATIC: Normal course and caliber of the major abdominal
vessels. No abdominal or pelvic lymphadenopathy.

REPRODUCTIVE: Normal uterus and ovaries.

MUSCULOSKELETAL. No bony spinal canal stenosis or focal osseous
abnormality.

OTHER: None.
IMPRESSION: No acute abnormality of the abdomen or pelvis.

## 2019-06-08 MED ORDER — SODIUM CHLORIDE 0.9 % IV BOLUS
1000.0000 mL | Freq: Once | INTRAVENOUS | Status: AC
  Administered 2019-06-08: 1000 mL via INTRAVENOUS

## 2019-06-08 MED ORDER — MORPHINE SULFATE (PF) 4 MG/ML IV SOLN
4.0000 mg | Freq: Once | INTRAVENOUS | Status: AC
  Administered 2019-06-08: 4 mg via INTRAVENOUS
  Filled 2019-06-08: qty 1

## 2019-06-08 MED ORDER — IOHEXOL 300 MG/ML  SOLN
100.0000 mL | Freq: Once | INTRAMUSCULAR | Status: AC | PRN
  Administered 2019-06-08: 100 mL via INTRAVENOUS

## 2019-06-08 MED ORDER — ONDANSETRON HCL 4 MG/2ML IJ SOLN
4.0000 mg | Freq: Once | INTRAMUSCULAR | Status: AC
  Administered 2019-06-08: 4 mg via INTRAVENOUS
  Filled 2019-06-08: qty 2

## 2019-06-08 NOTE — ED Triage Notes (Signed)
Pt states she has chronic nausea but over the past 2 weeks its gotten worse, pt states she started having abdominal pain that started 2 days ago. Pt started omeprazole this past week and hasnt had any relief from it.

## 2019-06-08 NOTE — ED Provider Notes (Signed)
[facility] EMERGENCY DEPARTMENT Provider Note   CSN: [id number] Arrival date & time: 06/08/19  1918     History Chief Complaint  Patient presents with  . Abdominal Pain    Rachel Griffith is a 34 y.o. female.  HPI     This is a 34 year old female with a history of anxiety, chronic pelvic pain, endometriosis status post hysterectomy who presents with abdominal pain and nausea.  Patient reports she has had 1 year of worsening nausea.  She states that she does not have insurance and has not seen any specialist.  She has been given Zofran, Carafate, and omeprazole by her primary physician to see if this helps.  She states her nausea is worsened with food intake.  She states at baseline she has some dull abdominal pain which is mostly on the left side.  However, she is over the last 2 to 3 days been experiencing some sharp right-sided abdominal pain that is different.  She currently rates her pain an 8 out of 10.  None of her medications are working.  She has nausea without vomiting.  No recent changes in bowel habits.  She states she is not constipated.  Denies any urinary symptoms including hematuria or dysuria.  Past Medical History:  Diagnosis Date  . Anxiety   . Back injury   . Chronic pelvic pain in female   . Endometriosis   . Endometriosis determined by laparoscopy 10/06/2014  . Ovarian cyst   . Pelvic pain in female 10/06/2014  . PONV (postoperative nausea and vomiting)    pt states that with 1st arthroscopy she "passed out and couldnt left her head" but they tell me it was a combination of the pain meds and anesthesia" No problems with next surgery. Not sure of meds given in anesthesia.  Marland Kitchen POTS (postural orthostatic tachycardia syndrome)    pt reports this was diagnosed a couple of months ago by PCP (Dr Annia Friendly)  . PTSD (post-traumatic stress disorder)     Patient Active Problem List   Diagnosis Date Noted  . Left ovarian cyst 07/31/2018  . Ovarian cyst, bilateral  02/08/2016  . Postoperative abdominal pain 11/24/2014  . Status post laparoscopic assisted vaginal hysterectomy (LAVH) 11/23/2014  . Pelvic pain in female 10/06/2014  . Endometriosis determined by laparoscopy 10/06/2014    Past Surgical History:  Procedure Laterality Date  . ABDOMINAL HYSTERECTOMY    . endometrosis  2014   fulgeration-[facility]  . KNEE SURGERY Bilateral    x2  . LAPAROSCOPIC BILATERAL SALPINGO OOPHERECTOMY Left 07/31/2018   Procedure: LAPAROSCOPIC LEFT SALPINGO OOPHORECTOMY;  Surgeon: Lazaro Arms, MD;  Location: [facility];  Service: Gynecology;  Laterality: Left;  . LAPAROSCOPY N/A 11/26/2014   Procedure: DIAGNOSTIC LAPAROSCOPY;  Surgeon: [name], MD;  Location: [facility];  Service: Gynecology;  Laterality: N/A;     OB History    Gravida  2   Para  2   Term  2   Preterm      AB      Living        SAB      TAB      Ectopic      Multiple      Live Births              Family History  Problem Relation Age of Onset  . Cancer Father        prostate  . Other Sister  IBS  . Other Brother        Lyme Disease  . Dementia Maternal Grandmother   . Parkinson's disease Maternal Grandfather   . Thrombocytopenia Sister     Social History   Tobacco Use  . Smoking status: Former Smoker    Packs/day: 0.50    Types: Cigarettes, E-cigarettes    Quit date: 02/28/2019    Years since quitting: 0.2  . Smokeless tobacco: Never Used  Substance Use Topics  . Alcohol use: Not Currently    Comment: occ  . Drug use: No    Home Medications Prior to Admission medications   Medication Sig Start Date End Date Taking? Authorizing Provider  albuterol (VENTOLIN HFA) 108 (90 Base) MCG/ACT inhaler Inhale 2 puffs into the lungs every 4 (four) hours as needed. 04/19/19  Yes [provider]  ALPRAZolam Prudy Feeler) 0.5 MG tablet Take 0.5 mg by mouth daily as needed for anxiety.    Yes [provider]  amphetamine-dextroamphetamine (ADDERALL) 20  MG tablet Take 20 mg by mouth 2 (two) times daily.   Yes [provider]  buPROPion (WELLBUTRIN XL) 150 MG 24 hr tablet Take 150 mg by mouth daily.   Yes [provider]  desogestrel-ethinyl estradiol (APRI) 0.15-30 MG-MCG tablet Take 1 tablet by mouth daily. 08/07/18  Yes Lazaro Arms, MD  gabapentin (NEURONTIN) 600 MG tablet Take 600-1,800 mg by mouth 3 (three) times daily. Take 600 mg at 1600, take 600 mg at 2200 and 1800 at 0400.   Yes [provider]  ibuprofen (ADVIL) 200 MG tablet Take 800 mg by mouth every 6 (six) hours as needed for mild pain or moderate pain.    Yes [provider]  levocetirizine (XYZAL) 5 MG tablet Take 5 mg by mouth daily.   Yes [provider]  midodrine (PROAMATINE) 10 MG tablet Take 10 mg by mouth 3 (three) times daily.  01/28/19  Yes [provider]  omeprazole (PRILOSEC) 20 MG capsule Take 20 mg by mouth 2 (two) times daily before a meal.   Yes [provider]  ondansetron (ZOFRAN ODT) 4 MG disintegrating tablet Take 1 tablet (4 mg total) by mouth every 8 (eight) hours as needed for nausea or vomiting. Patient taking differently: Take 4 mg by mouth in the morning and at bedtime.  05/31/19  Yes Idol, Raynelle Fanning, PA-C  polyethylene glycol powder (GLYCOLAX/MIRALAX) 17 GM/SCOOP powder Take 17 g by mouth daily.   Yes [provider]  sucralfate (CARAFATE) 1 g tablet Take 1 g by mouth 4 (four) times daily -  with meals and at bedtime.   Yes [provider]  triamcinolone (NASACORT ALLERGY 24HR) 55 MCG/ACT AERO nasal inhaler Place 2 sprays into the nose daily.   Yes [provider]    Allergies    Almond meal, Cymbalta [duloxetine hcl], Sulfa antibiotics, Trazodone and nefazodone, and Sudafed [pseudoephedrine hcl]  Review of Systems   Review of Systems  Constitutional: Negative for fever.  Respiratory: Negative for shortness of breath.   Cardiovascular: Negative for chest pain.    Gastrointestinal: Positive for abdominal pain and nausea. Negative for constipation, diarrhea and vomiting.  Genitourinary: Negative for dysuria.  All other systems reviewed and are negative.   Physical Exam Updated Vital Signs BP 129/86 (BP Location: Right Arm)   Pulse 100   Temp 97.7 F (36.5 C) (Oral)   Resp 18   Ht 1.753 m (5\' 9" )   Wt 63.5 kg   LMP 09/19/2014  Comment: post LAVH  SpO2 100%   BMI 20.67 kg/m   Physical Exam Vitals and nursing note reviewed.  Constitutional:      Appearance: She is well-developed.     Comments: Anxious appearing but nontoxic  HENT:     Head: Normocephalic and atraumatic.  Eyes:     Pupils: Pupils are equal, round, and reactive to light.  Cardiovascular:     Rate and Rhythm: Normal rate and regular rhythm.     Heart sounds: Normal heart sounds.  Pulmonary:     Effort: Pulmonary effort is normal. No respiratory distress.     Breath sounds: No wheezing.  Abdominal:     General: Bowel sounds are normal.     Palpations: Abdomen is soft.     Tenderness: There is abdominal tenderness in the right lower quadrant. There is no right CVA tenderness, left CVA tenderness, guarding or rebound.  Musculoskeletal:     Cervical back: Neck supple.  Skin:    General: Skin is warm and dry.  Neurological:     Mental Status: She is alert and oriented to person, place, and time.  Psychiatric:        Mood and Affect: Mood is anxious.     ED Results / Procedures / Treatments   Labs (all labs ordered are listed, but only abnormal results are displayed) Labs Reviewed  COMPREHENSIVE METABOLIC PANEL - Abnormal; Notable for the following components:      Result Value   AST 14 (*)    All other components within normal limits  URINALYSIS, ROUTINE W REFLEX MICROSCOPIC - Abnormal; Notable for the following components:   Specific Gravity, Urine 1.043 (*)    All other components within normal limits  LIPASE, BLOOD  CBC  HCG, QUANTITATIVE, PREGNANCY     EKG None  Radiology CT ABDOMEN PELVIS W CONTRAST  Result Date: 06/09/2019 CLINICAL DATA:  Right lower quadrant abdominal pain EXAM: CT ABDOMEN AND PELVIS WITH CONTRAST TECHNIQUE: Multidetector CT imaging of the abdomen and pelvis was performed using the standard protocol following bolus administration of intravenous contrast. CONTRAST:  169mL OMNIPAQUE IOHEXOL 300 MG/ML  SOLN COMPARISON:  None. FINDINGS: LOWER CHEST: Normal. HEPATOBILIARY: Normal hepatic contours. No intra- or extrahepatic biliary dilatation. The gallbladder is normal. PANCREAS: Normal pancreas. No ductal dilatation or peripancreatic fluid collection. SPLEEN: Normal. ADRENALS/URINARY TRACT: The adrenal glands are normal. No hydronephrosis, nephroureterolithiasis or solid renal mass. The urinary bladder is normal for degree of distention STOMACH/BOWEL: There is no hiatal hernia. Normal duodenal course and caliber. No small bowel dilatation or inflammation. No focal colonic abnormality. The appendix is not definitively visualized. No right lower quadrant inflammation or free fluid. VASCULAR/LYMPHATIC: Normal course and caliber of the major abdominal vessels. No abdominal or pelvic lymphadenopathy. REPRODUCTIVE: Normal uterus and ovaries. MUSCULOSKELETAL. No bony spinal canal stenosis or focal osseous abnormality. OTHER: None. IMPRESSION: No acute abnormality of the abdomen or pelvis. Electronically Signed   By: Ulyses Jarred M.D.   On: 06/09/2019 00:21    Procedures Procedures (including critical care time)  Medications Ordered in ED Medications  morphine 4 MG/ML injection 4 mg (4 mg Intravenous Given 06/08/19 2334)  ondansetron (ZOFRAN) injection 4 mg (4 mg Intravenous Given 06/08/19 2334)  sodium chloride 0.9 % bolus 1,000 mL (1,000 mLs Intravenous New Bag/Given 06/08/19 2335)  iohexol (OMNIPAQUE) 300 MG/ML solution 100 mL (100 mLs Intravenous Contrast Given 06/08/19 2356)    ED Course  I have reviewed the triage vital signs  and the nursing  notes.  Pertinent labs & imaging results that were available during my care of the patient were reviewed by me and considered in my medical decision making (see chart for details).    MDM Rules/Calculators/A&P                       Patient presents with abdominal pain and nausea.  History of the same.  She is status post hysterectomy and bilateral oophorectomy.  She is anxious appearing but nontoxic on exam.  Vital signs are reassuring.  She is tender in the right lower quadrant.  She still has her appendix but has no ovary on that side.  Last imaging was in June of last year.  She reports that she does not have any insurance and has not had any specialty work-up.  Basic lab work obtained.  Lab work-up shows no evidence of metabolic derangement.  No significant leukocytosis.  Urinalysis without evidence of infection.  Given location of pain and possible enthesitis, will obtain a CT scan given that she has not had recent imaging.  CT scan is negative for any acute findings.  I discussed the results with the patient.  Some of her symptoms appear acute on chronic although she reports that her pain today is different than normal.  Recommend close follow-up with Dr. Margo Aye and she may need referral for specialist evaluation including gastroenterology.  After history, exam, and medical workup I feel the patient has been appropriately medically screened and is safe for discharge home. Pertinent diagnoses were discussed with the patient. Patient was given return precautions.   Final Clinical Impression(s) / ED Diagnoses Final diagnoses:  Generalized abdominal pain    Rx / DC Orders ED Discharge Orders    None       Tyleah Loh, Mayer Masker, MD 06/09/19 [phone]

## 2019-06-09 ENCOUNTER — Other Ambulatory Visit: Payer: Self-pay | Admitting: Obstetrics & Gynecology

## 2019-06-09 LAB — URINALYSIS, ROUTINE W REFLEX MICROSCOPIC
Bilirubin Urine: NEGATIVE
Glucose, UA: NEGATIVE mg/dL
Hgb urine dipstick: NEGATIVE
Ketones, ur: NEGATIVE mg/dL
Leukocytes,Ua: NEGATIVE
Nitrite: NEGATIVE
Protein, ur: NEGATIVE mg/dL
Specific Gravity, Urine: 1.043 — ABNORMAL HIGH (ref 1.005–1.030)
pH: 6 (ref 5.0–8.0)

## 2019-06-09 NOTE — Discharge Instructions (Addendum)
You were seen today for abdominal pain and nausea.  Your work-up is largely reassuring.  Follow-up closely with Dr. Margo Aye.  You may need specialty work-up given your history and chronicity of symptoms.

## 2019-06-09 NOTE — ED Notes (Signed)
Patient given ginger ale. 

## 2019-08-02 ENCOUNTER — Emergency Department (HOSPITAL_COMMUNITY)
Admission: EM | Admit: 2019-08-02 | Discharge: 2019-08-02 | Disposition: A | Discharge status: Home / Self Care | Payer: 59 | Attending: Emergency Medicine | Admitting: Emergency Medicine

## 2019-08-02 ENCOUNTER — Encounter (HOSPITAL_COMMUNITY): Payer: Self-pay | Admitting: Emergency Medicine

## 2019-08-02 ENCOUNTER — Other Ambulatory Visit: Payer: Self-pay

## 2019-08-02 DIAGNOSIS — Z79899 Other long term (current) drug therapy: Secondary | ICD-10-CM | POA: Diagnosis not present

## 2019-08-02 DIAGNOSIS — Z87891 Personal history of nicotine dependence: Secondary | ICD-10-CM | POA: Diagnosis not present

## 2019-08-02 DIAGNOSIS — M5442 Lumbago with sciatica, left side: Secondary | ICD-10-CM

## 2019-08-02 DIAGNOSIS — M545 Low back pain: Secondary | ICD-10-CM | POA: Diagnosis present

## 2019-08-02 MED ORDER — PREDNISONE 10 MG PO TABS
40.0000 mg | ORAL_TABLET | Freq: Every day | ORAL | 0 refills | Status: AC
Start: 2019-08-03 — End: 2019-08-07

## 2019-08-02 MED ORDER — KETOROLAC TROMETHAMINE 60 MG/2ML IM SOLN
15.0000 mg | Freq: Once | INTRAMUSCULAR | Status: AC
  Administered 2019-08-02: 15 mg via INTRAMUSCULAR
  Filled 2019-08-02: qty 2

## 2019-08-02 MED ORDER — LIDOCAINE 5 % EX PTCH
1.0000 | MEDICATED_PATCH | CUTANEOUS | Status: DC
  Administered 2019-08-02: 1 via TRANSDERMAL
  Filled 2019-08-02: qty 1

## 2019-08-02 MED ORDER — OXYCODONE-ACETAMINOPHEN 5-325 MG PO TABS
1.0000 | ORAL_TABLET | Freq: Once | ORAL | Status: AC
  Administered 2019-08-02: 1 via ORAL
  Filled 2019-08-02: qty 1

## 2019-08-02 MED ORDER — LIDOCAINE 5 % EX PTCH: 1.0000 | MEDICATED_PATCH | CUTANEOUS | 0 refills | Status: DC

## 2019-08-02 MED ORDER — METHOCARBAMOL 500 MG PO TABS: 500.0000 mg | ORAL_TABLET | Freq: Three times a day (TID) | ORAL | 0 refills | Status: AC

## 2019-08-02 MED ORDER — PREDNISONE 20 MG PO TABS
40.0000 mg | ORAL_TABLET | Freq: Once | ORAL | Status: AC
  Administered 2019-08-02: 40 mg via ORAL
  Filled 2019-08-02: qty 2

## 2019-08-02 NOTE — ED Notes (Signed)
Pt with a hx of POTS   Bent to tie shoe yesterday   Felt a pop   Here for pain relief

## 2019-08-02 NOTE — ED Provider Notes (Signed)
[facility] EMERGENCY DEPARTMENT Provider Note   CSN: [id number] Arrival date & time: 08/02/19  1610     History Chief Complaint  Patient presents with  . Back Pain    Rachel Griffith is a 34 y.o. female history of chronic back pain, POTS, PCOS, endometriosis, hysterectomy oophorectomy.  Presents today for left lower back pain.  Patient reports that she has chronic lower back pain that began in 2017 after a wakeboarding injury.  She reports that she was seen by spine specialist during that year and was told that she had a bulging disc in her left lower back.  She reports that she has had intermittent left lower back pain and left-sided sciatica since that time.  She reports that her back pain exacerbation began yesterday after attempting to put on her shoes she stood up and had a return of her pain.  She describes severe left lower back pain aching spasm which will occasionally radiate down the outside of her left leg, worsened with movement minimally improved with Advil taken yesterday and 1 Aleve taken today.  She denies fevers/chills, chest pain, abdominal pain, nausea/vomiting, dysuria/hematuria, fall/injury, saddle paresthesias, bowel/bladder incontinence, urinary retention, IV drug use, history of cancer, numbness or weakness of the extremities or any additional concerns.  HPI     Past Medical History:  Diagnosis Date  . Anxiety   . Back injury   . Chronic pelvic pain in female   . Endometriosis   . Endometriosis determined by laparoscopy 10/06/2014  . Ovarian cyst   . Pelvic pain in female 10/06/2014  . PONV (postoperative nausea and vomiting)    pt states that with 1st arthroscopy she "passed out and couldnt left her head" but they tell me it was a combination of the pain meds and anesthesia" No problems with next surgery. Not sure of meds given in anesthesia.  Marland Kitchen POTS (postural orthostatic tachycardia syndrome)    pt reports this was diagnosed a couple of months ago by PCP  (Dr Annia Friendly)  . PTSD (post-traumatic stress disorder)     Patient Active Problem List   Diagnosis Date Noted  . Left ovarian cyst 07/31/2018  . Ovarian cyst, bilateral 02/08/2016  . Postoperative abdominal pain 11/24/2014  . Status post laparoscopic assisted vaginal hysterectomy (LAVH) 11/23/2014  . Pelvic pain in female 10/06/2014  . Endometriosis determined by laparoscopy 10/06/2014    Past Surgical History:  Procedure Laterality Date  . ABDOMINAL HYSTERECTOMY    . endometrosis  2014   [facility]geration-[facility]  . KNEE SURGERY Bilateral    x2  . LAPAROSCOPIC BILATERAL SALPINGO OOPHERECTOMY Left 07/31/2018   Procedure: LAPAROSCOPIC LEFT SALPINGO OOPHORECTOMY;  Surgeon: Lazaro Arms, MD;  Location: [facility];  Service: Gynecology;  Laterality: Left;  . LAPAROSCOPY N/A 11/26/2014   Procedure: DIAGNOSTIC LAPAROSCOPY;  Surgeon: [name], MD;  Location: [facility];  Service: Gynecology;  Laterality: N/A;     OB History    Gravida  2   Para  2   Term  2   Preterm      AB      Living  2     SAB      TAB      Ectopic      Multiple      Live Births              Family History  Problem Relation Age of Onset  . Cancer Father        prostate  .  Other Sister        IBS  . Other Brother        Lyme Disease  . Dementia Maternal Grandmother   . Parkinson's disease Maternal Grandfather   . Thrombocytopenia Sister     Social History   Tobacco Use  . Smoking status: Former Smoker    Packs/day: 0.50    Types: Cigarettes, E-cigarettes    Quit date: 02/28/2019    Years since quitting: 0.4  . Smokeless tobacco: Never Used  Substance Use Topics  . Alcohol use: Yes    Comment: occ  . Drug use: No    Home Medications Prior to Admission medications   Medication Sig Start Date End Date Taking? Authorizing Provider  albuterol (VENTOLIN HFA) 108 (90 Base) MCG/ACT inhaler Inhale 2 puffs into the lungs every 4 (four) hours as needed. 04/19/19   [provider]    ALPRAZolam Prudy Feeler) 0.5 MG tablet Take 0.5 mg by mouth daily as needed for anxiety.     [provider]  amphetamine-dextroamphetamine (ADDERALL) 20 MG tablet Take 20 mg by mouth 2 (two) times daily.    [provider]  APRI 0.15-30 MG-MCG tablet TAKE 1 TABLET BY MOUTH EVERY DAY 06/09/19   Lazaro Arms, MD  buPROPion (WELLBUTRIN XL) 150 MG 24 hr tablet Take 150 mg by mouth daily.    [provider]  gabapentin (NEURONTIN) 600 MG tablet Take 600-1,800 mg by mouth 3 (three) times daily. Take 600 mg at 1600, take 600 mg at 2200 and 1800 at 0400.    [provider]  ibuprofen (ADVIL) 200 MG tablet Take 800 mg by mouth every 6 (six) hours as needed for mild pain or moderate pain.     [provider]  levocetirizine (XYZAL) 5 MG tablet Take 5 mg by mouth daily.    [provider]  lidocaine (LIDODERM) 5 % Place 1 patch onto the skin daily. Remove & Discard patch within 12 hours or as directed by MD 08/02/19   Bill Salinas, PA-C  methocarbamol (ROBAXIN) 500 MG tablet Take 1 tablet (500 mg total) by mouth 3 (three) times daily for 7 days. 08/02/19 08/09/19  Harlene Salts A, PA-C  midodrine (PROAMATINE) 10 MG tablet Take 10 mg by mouth 3 (three) times daily.  01/28/19   [provider]  omeprazole (PRILOSEC) 20 MG capsule Take 20 mg by mouth 2 (two) times daily before a meal.    [provider]  ondansetron (ZOFRAN ODT) 4 MG disintegrating tablet Take 1 tablet (4 mg total) by mouth every 8 (eight) hours as needed for nausea or vomiting. Patient taking differently: Take 4 mg by mouth in the morning and at bedtime.  05/31/19   Burgess Amor, PA-C  polyethylene glycol powder (GLYCOLAX/MIRALAX) 17 GM/SCOOP powder Take 17 g by mouth daily.    [provider]  predniSONE (DELTASONE) 10 MG tablet Take 4 tablets (40 mg total) by mouth daily for 4 days. You were given your first dose in the ER today.  You may begin taking prednisone as  prescribed starting tomorrow. 08/03/19 08/07/19  Harlene Salts A, PA-C  sucralfate (CARAFATE) 1 g tablet Take 1 g by mouth 4 (four) times daily -  with meals and at bedtime.    [provider]  triamcinolone (NASACORT ALLERGY 24HR) 55 MCG/ACT AERO nasal inhaler Place 2 sprays into the nose daily.    [provider]    Allergies    Almond meal, Cymbalta [  duloxetine hcl], Sulfa antibiotics, Trazodone and nefazodone, and Sudafed [pseudoephedrine hcl]  Review of Systems   Review of Systems  Constitutional: Negative for chills and fever.  Cardiovascular: Negative.  Negative for chest pain.  Gastrointestinal: Negative.  Negative for abdominal pain, nausea and vomiting.  Genitourinary: Negative.  Negative for dysuria and hematuria.  Musculoskeletal: Positive for back pain. Negative for neck pain.  Neurological: Negative.  Negative for weakness, numbness and headaches.       Denies saddle or paresthesias Denies bowel/bladder incontinence Denies urinary retention    Physical Exam Updated Vital Signs BP (!) 106/92 (BP Location: Right Arm)   Pulse 100   Temp 98.5 F (36.9 C) (Oral)   Resp 18   Ht 5\' 9"  (1.753 m)   Wt 63.5 kg   LMP 09/19/2014 Comment: post LAVH  SpO2 98%   BMI 20.67 kg/m   Physical Exam Constitutional:      General: She is not in acute distress.    Appearance: Normal appearance. She is well-developed. She is not ill-appearing or diaphoretic.  HENT:     Head: Normocephalic and atraumatic.  Eyes:     General: Vision grossly intact. Gaze aligned appropriately.     Pupils: Pupils are equal, round, and reactive to light.  Neck:     Trachea: Trachea and phonation normal.  Cardiovascular:     Pulses:          Dorsalis pedis pulses are 2+ on the right side and 2+ on the left side.  Pulmonary:     Effort: Pulmonary effort is normal. No respiratory distress.  Abdominal:     General: There is no distension.     Palpations: Abdomen is soft.      Tenderness: There is no abdominal tenderness. There is no guarding or rebound.  Musculoskeletal:        General: Normal range of motion.     Cervical back: Normal range of motion.       Back:     Comments: Patient's area of pain.  No overlying skin changes or evidence of trauma. - No midline C/T/L spinal tenderness to palpation, no deformity, crepitus, or step-off noted. No sign of injury to the neck or back.  Positive left straight leg raise.   Feet:     Right foot:     Protective Sensation: 3 sites tested. 3 sites sensed.     Left foot:     Protective Sensation: 3 sites tested. 3 sites sensed.  Skin:    General: Skin is warm and dry.  Neurological:     Mental Status: She is alert.     GCS: GCS eye subscore is 4. GCS verbal subscore is 5. GCS motor subscore is 6.     Comments: Speech is clear and goal oriented, follows commands Major Cranial nerves without deficit, no facial droop Normal strength in upper and lower extremities bilaterally including dorsiflexion and plantar flexion, strong and equal grip strength Sensation normal to light and sharp touch Moves extremities without ataxia, coordination intact DTR 2+ bilateral patella, no clonus of the feet  Psychiatric:        Behavior: Behavior normal.    ED Results / Procedures / Treatments   Labs (all labs ordered are listed, but only abnormal results are displayed) Labs Reviewed - No data to display  EKG None  Radiology No results found.  Procedures Procedures (including critical care time)  Medications Ordered in ED Medications  lidocaine (LIDODERM) 5 % 1 patch (1  patch Transdermal Patch Applied 08/02/19 1816)  oxyCODONE-acetaminophen (PERCOCET/ROXICET) 5-325 MG per tablet 1 tablet (1 tablet Oral Given 08/02/19 1814)  predniSONE (DELTASONE) tablet 40 mg (40 mg Oral Given 08/02/19 1813)  ketorolac (TORADOL) injection 15 mg (15 mg Intramuscular Given 08/02/19 1815)    ED Course  I have reviewed the triage vital signs  and the nursing notes.  Pertinent labs & imaging results that were available during my care of the patient were reviewed by me and considered in my medical decision making (see chart for details).    MDM Rules/Calculators/A&P                     Additional History Obtained: 1. Nursing notes from this visit. ---------------------- [name] is a 34 y.o. female presenting with Left sided back pain. Pain has been present for 1 day and is described as their typical back pain. Patient denies history of trauma, fever, IV drug use, night sweats, weight loss, cancer, saddle anesthesia, urinary rentention, bowel/bladder incontinence. No neurological deficits and normal neuro exam. Suspect musculoskeletal etiology of patient's pain. Pain is consistently reproducible with left leg raise. Abdomen soft/nontender and without pulsatile mass. Patient with equal pedal pulses. Doubt spinal epidural abscess, cauda equina, AAA, kidney stone or other emergent pathologies at this time. No indication for imaging today, patient reports she has had imaging in the past through her spine specialist and PCP which showed bulging disc.  Will give patient 1 Percocet here in the ER for pain relief, her mother is driving home today.  Additionally will give 15 mg intramuscular Toradol to help with her symptoms, she denies history of CKD or gastric ulcers/bleeding.  She denies any contraindications to NSAIDs, additionally she denies chance of pregnancy today as she has history of hysterectomy.  Additionally will prescribe patient Lidoderm patches to help with her symptoms.  Will start patient on prednisone burst to help with sciatica, she reports good relief of symptoms in the past after use of steroids, she denies any adverse reaction to steroid medications in the past and denies history of diabetes. Robaxin 500mg  BID prescribed. Patient informed to avoid driving or operating heavy machinery while taking muscle relaxer.   At  this time there does not appear to be any evidence of an acute emergency medical condition and the patient appears stable for discharge with appropriate outpatient follow up. Diagnosis was discussed with patient who verbalizes understanding of care plan and is agreeable to discharge. I have discussed return precautions with patient who verbalizes understanding. Patient encouraged to follow-up with their PCP. All questions answered.   Note: Portions of this report may have been transcribed using voice recognition software. Every effort was made to ensure accuracy; however, inadvertent computerized transcription errors may still be present. Final Clinical Impression(s) / ED Diagnoses Final diagnoses:  Left-sided low back pain with left-sided sciatica, unspecified chronicity    Rx / DC Orders ED Discharge Orders         Ordered    predniSONE (DELTASONE) 10 MG tablet  Daily     08/02/19 1837    lidocaine (LIDODERM) 5 %  Every 24 hours     08/02/19 1837    methocarbamol (ROBAXIN) 500 MG tablet  3 times daily     08/02/19 1837           10/02/19 08/02/19 1840    10/02/19, MD 08/02/19 2304

## 2019-08-02 NOTE — Discharge Instructions (Addendum)
At this time there does not appear to be the presence of an emergent medical condition, however there is always the potential for conditions to change. Please read and follow the below instructions.  Please return to the Emergency Department immediately for any new or worsening symptoms. Please be sure to follow up with your Primary Care Provider within one week regarding your visit today; please call their office to schedule an appointment even if you are feeling better for a follow-up visit. You were given a pain medication today called Percocet.  This medication will make you drowsy.  Do not drive, drink alcohol or perform any dangerous activities for the rest of the day. You have been given an NSAID-containing medication called Toradol today.  Do not take the medications including ibuprofen, Aleve, Advil, naproxen or other NSAID-containing medications for the next 2 days.  Please be sure to drink plenty of water over the next few days. You may use the Lidoderm patch as prescribed to help with your symptoms.  Lidoderm may be expensive so you may speak with your pharmacist about finding over-the-counter medications that work similarly. You may use the muscle relaxer Robaxin as prescribed to help with your symptoms.  Do not drive or operate heavy machinery while taking Robaxin as it will make you drowsy.  Do not drink alcohol or take other sedating medications while taking Robaxin as this will worsen side effects. You have been started on a steroid burst today.  You were given your first dose of prednisone in the ER, starting tomorrow you may begin taking the prednisone as prescribed, 40 mg daily until complete to help with your sciatica.  Get help right away if: You develop new bowel or bladder control problems. You have unusual weakness or numbness in your arms or legs. You develop nausea or vomiting. You develop abdominal pain. You feel faint. You have fever or chills You have any new/concerning  or worsening symptoms  Please read the additional information packets attached to your discharge summary.  Do not take your medicine if  develop an itchy rash, swelling in your mouth or lips, or difficulty breathing; call 911 and seek immediate emergency medical attention if this occurs.  Note: Portions of this text may have been transcribed using voice recognition software. Every effort was made to ensure accuracy; however, inadvertent computerized transcription errors may still be present.

## 2019-08-02 NOTE — ED Triage Notes (Signed)
Patient c/o left lower back pain. Per patient has hx of bulging disc. Per patient bent down to tie shoe yesterday and when she stood up felt catch and pain has been progressively getting worse. Patient taking advil and aleve with no relief. Patient has also tried ice and heat multiple time with no relief. CNS intact. Denies any complications with urination or BM.

## 2019-09-08 ENCOUNTER — Emergency Department (HOSPITAL_COMMUNITY)
Admission: EM | Admit: 2019-09-08 | Discharge: 2019-09-09 | Disposition: A | Discharge status: Home / Self Care | Payer: 59 | Attending: Emergency Medicine | Admitting: Emergency Medicine

## 2019-09-08 ENCOUNTER — Encounter (HOSPITAL_COMMUNITY): Payer: Self-pay | Admitting: Emergency Medicine

## 2019-09-08 DIAGNOSIS — M5442 Lumbago with sciatica, left side: Secondary | ICD-10-CM | POA: Diagnosis not present

## 2019-09-08 DIAGNOSIS — Z87891 Personal history of nicotine dependence: Secondary | ICD-10-CM | POA: Diagnosis not present

## 2019-09-08 NOTE — ED Triage Notes (Signed)
Patient was getting into bathtub and heard a pop in her back, having lower back pain and tingling and numbness down left leg, has history of bulging disc in back.

## 2019-09-09 MED ORDER — METHOCARBAMOL 500 MG PO TABS
500.0000 mg | ORAL_TABLET | Freq: Three times a day (TID) | ORAL | 0 refills | Status: DC | PRN
Start: 2019-09-09 — End: 2019-09-18

## 2019-09-09 MED ORDER — KETOROLAC TROMETHAMINE 60 MG/2ML IM SOLN
15.0000 mg | Freq: Once | INTRAMUSCULAR | Status: AC
  Administered 2019-09-09: 15 mg via INTRAMUSCULAR
  Filled 2019-09-09: qty 2

## 2019-09-09 MED ORDER — OXYCODONE-ACETAMINOPHEN 5-325 MG PO TABS
1.0000 | ORAL_TABLET | Freq: Once | ORAL | Status: AC
  Administered 2019-09-09: 1 via ORAL
  Filled 2019-09-09: qty 1

## 2019-09-09 MED ORDER — METHYLPREDNISOLONE SODIUM SUCC 125 MG IJ SOLR
125.0000 mg | Freq: Once | INTRAMUSCULAR | Status: AC
  Administered 2019-09-09: 125 mg via INTRAMUSCULAR
  Filled 2019-09-09: qty 2

## 2019-09-09 MED ORDER — METHOCARBAMOL 500 MG PO TABS
500.0000 mg | ORAL_TABLET | Freq: Once | ORAL | Status: AC
  Administered 2019-09-09: 500 mg via ORAL
  Filled 2019-09-09: qty 1

## 2019-09-09 MED ORDER — MORPHINE SULFATE (PF) 4 MG/ML IV SOLN
4.0000 mg | Freq: Once | INTRAVENOUS | Status: AC
  Administered 2019-09-09: 4 mg via INTRAMUSCULAR
  Filled 2019-09-09: qty 1

## 2019-09-09 MED ORDER — PREDNISONE 20 MG PO TABS
ORAL_TABLET | ORAL | 0 refills | Status: DC
Start: 2019-09-09 — End: 2019-09-18

## 2019-09-09 NOTE — ED Provider Notes (Signed)
Emergency Department Provider Note  I have reviewed the triage vital signs and the nursing notes.  HISTORY  Chief Complaint Back Injury   HPI ARYONA SILL is a 34 y.o. female with history of a bulging disc some on her back who presents to the emergency department today secondary to back pain.  Patient states that she stepped in the tub and something popped in her left lower back similar to previous times but more severe.  It caused her to have some tingling in her toes but no other radiation of the pain.  Is pretty severe in her left lower back.  It is difficult to walk on that side.  Laying still seems to help it.  No urinary or fecal incontinence.  Right leg is unaffected.   No other associated or modifying symptoms.    Past Medical History:  Diagnosis Date  . Anxiety   . Back injury   . Chronic pelvic pain in female   . Endometriosis   . Endometriosis determined by laparoscopy 10/06/2014  . Ovarian cyst   . Pelvic pain in female 10/06/2014  . PONV (postoperative nausea and vomiting)    pt states that with 1st arthroscopy she "passed out and couldnt left her head" but they tell me it was a combination of the pain meds and anesthesia" No problems with next surgery. Not sure of meds given in anesthesia.  Marland Kitchen POTS (postural orthostatic tachycardia syndrome)    pt reports this was diagnosed a couple of months ago by PCP (Dr Annia Friendly)  . PTSD (post-traumatic stress disorder)     Patient Active Problem List   Diagnosis Date Noted  . Left ovarian cyst 07/31/2018  . Ovarian cyst, bilateral 02/08/2016  . Postoperative abdominal pain 11/24/2014  . Status post laparoscopic assisted vaginal hysterectomy (LAVH) 11/23/2014  . Pelvic pain in female 10/06/2014  . Endometriosis determined by laparoscopy 10/06/2014    Past Surgical History:  Procedure Laterality Date  . ABDOMINAL HYSTERECTOMY    . endometrosis  2014   [facility]geration-[facility]  . KNEE SURGERY Bilateral    x2  .  LAPAROSCOPIC BILATERAL SALPINGO OOPHERECTOMY Left 07/31/2018   Procedure: LAPAROSCOPIC LEFT SALPINGO OOPHORECTOMY;  Surgeon: Lazaro Arms, MD;  Location: [facility];  Service: Gynecology;  Laterality: Left;  . LAPAROSCOPY N/A 11/26/2014   Procedure: DIAGNOSTIC LAPAROSCOPY;  Surgeon: [name], MD;  Location: [facility];  Service: Gynecology;  Laterality: N/A;    Current Outpatient Rx  . Order #: [id number] Class: Historical Med  . Order #: [id number] Class: Historical Med  . Order #: [id number] Class: Historical Med  . Order #: [id number] Class: Normal  . Order #: [id number] Class: Historical Med  . Order #: [id number] Class: Historical Med  . Order #: [id number] Class: Historical Med  . Order #: [id number] Class: Historical Med  . Order #: [id number] Class: Print  . Order #: [id number] Class: Print  . Order #: [id number] Class: Historical Med  . Order #: [id number] Class: Historical Med  . Order #: [id number] Class: Print  . Order #: [id number] Class: Historical Med  . Order #: [id number] Class: Print  . Order #: [id number] Class: Historical Med  . Order #: [id number] Class: Historical Med    Allergies Almond meal, Cymbalta [duloxetine hcl], Sulfa antibiotics, Trazodone and nefazodone, and Sudafed [pseudoephedrine hcl]  Family History  Problem Relation Age of Onset  . Cancer Father        prostate  . Other Sister        IBS  . Other Brother        Lyme  Disease  . Dementia Maternal Grandmother   . Parkinson's disease Maternal Grandfather   . Thrombocytopenia Sister     Social History Social History   Tobacco Use  . Smoking status: Former Smoker    Packs/day: 0.50    Types: Cigarettes, E-cigarettes    Quit date: 02/28/2019    Years since quitting: 0.5  . Smokeless tobacco: Never Used  Vaping Use  . Vaping Use: Former  Substance Use Topics  . Alcohol use: Yes    Comment: occ  . Drug use: No    Review of Systems  All other systems negative except as documented in the HPI. All pertinent positives  and negatives as reviewed in the HPI. ____________________________________________  PHYSICAL EXAM:  VITAL SIGNS: ED Triage Vitals  Enc Vitals Group     BP 09/08/19 2220 (!) 116/91     Pulse Rate 09/08/19 2220 92     Resp 09/08/19 2220 20     Temp 09/08/19 2220 98.1 F (36.7 C)     Temp Source 09/08/19 2220 Oral     SpO2 09/08/19 2220 100 %     Weight 09/08/19 2222 150 lb (68 kg)     Height 09/08/19 2222 5\' 9"  (1.753 m)    Constitutional: Alert and oriented. Well appearing and in no acute distress. Eyes: Conjunctivae are normal. PERRL. EOMI. Head: Atraumatic. Nose: No congestion/rhinnorhea. Mouth/Throat: Mucous membranes are moist.  Oropharynx non-erythematous. Neck: No stridor.  No meningeal signs.   Cardiovascular: Normal rate, regular rhythm. Good peripheral circulation. Grossly normal heart sounds.   Respiratory: Normal respiratory effort.  No retractions. Lungs CTAB. Gastrointestinal: Soft and nontender. No distention.  Musculoskeletal: No lower extremity tenderness nor edema. No gross deformities of extremities. Neurologic:  Normal speech and language. No gross focal neurologic deficits are appreciated. Normal sensation and DTR to LLE. No perineal sensation changes.  Skin:  Skin is warm, dry and intact. No rash noted.  ____________________________________________   LABS (all labs ordered are listed, but only abnormal results are displayed)  Labs Reviewed - No data to display ____________________________________________  EKG   EKG Interpretation  Date/Time:    Ventricular Rate:    PR Interval:    QRS Duration:   QT Interval:    QTC Calculation:   R Axis:     Text Interpretation:         ____________________________________________  RADIOLOGY  No results found. ____________________________________________  PROCEDURES  Procedure(s) performed:   Procedures ____________________________________________  INITIAL IMPRESSION / ASSESSMENT AND PLAN / ED  COURSE   This patient presents to the ED for concern of back pain, this involves an extensive number of treatment options, and is a complaint that carries with it a high risk of complications and morbidity.  The differential diagnosis includes sciatica, ruptured disc, cauda equina.     Lab Tests:   None indicated   Medicines ordered:   I ordered medication robaxin, prednisone, percocet and toradol  For her pain.    Imaging Studies ordered:   Likely needs MRI, but not emergently  Additional history obtained:   Additional history obtained from noone  Previous records obtained and reviewed in epic  Reevaluation:  After the interventions stated above, I reevaluated the patient and found improved pain. Ambulates with mild limp. Stable for dc with outpatient follow up.   A medical screening exam was performed and I feel the patient has had an appropriate workup for their chief complaint at this time and likelihood of emergent condition existing is  low. They have been counseled on decision, discharge, follow up and which symptoms necessitate immediate return to the emergency department. They or their family verbally stated understanding and agreement with plan and discharged in stable condition.   ____________________________________________  FINAL CLINICAL IMPRESSION(S) / ED DIAGNOSES  Final diagnoses:  Acute left-sided low back pain with left-sided sciatica    MEDICATIONS GIVEN DURING THIS VISIT:  Medications  methylPREDNISolone sodium succinate (SOLU-MEDROL) 125 mg/2 mL injection 125 mg (125 mg Intramuscular Given 09/09/19 0105)  ketorolac (TORADOL) injection 15 mg (15 mg Intramuscular Given 09/09/19 0107)  methocarbamol (ROBAXIN) tablet 500 mg (500 mg Oral Given 09/09/19 0103)  oxyCODONE-acetaminophen (PERCOCET/ROXICET) 5-325 MG per tablet 1 tablet (1 tablet Oral Given 09/09/19 0103)  morphine 4 MG/ML injection 4 mg (4 mg Intramuscular Given 09/09/19 0220)    NEW  OUTPATIENT MEDICATIONS STARTED DURING THIS VISIT:  Discharge Medication List as of 09/09/2019  3:15 AM    START taking these medications   Details  methocarbamol (ROBAXIN) 500 MG tablet Take 1 tablet (500 mg total) by mouth every 8 (eight) hours as needed for muscle spasms., Starting Wed 09/09/2019, Print    predniSONE (DELTASONE) 20 MG tablet 3 tabs po daily x 3 days, then 2 tabs x 3 days, then 1.5 tabs x 3 days, then 1 tab x 3 days, then 0.5 tabs x 3 days, Print        Note:  This note was prepared with assistance of Dragon voice recognition software. Occasional wrong-word or sound-a-like substitutions may have occurred due to the inherent limitations of voice recognition software.   Takya Vandivier, Barbara Cower, MD 09/09/19 [phone]

## 2019-09-17 ENCOUNTER — Other Ambulatory Visit: Payer: Self-pay

## 2019-09-17 ENCOUNTER — Emergency Department (HOSPITAL_COMMUNITY)
Admission: EM | Admit: 2019-09-17 | Discharge: 2019-09-18 | Disposition: A | Discharge status: Home / Self Care | Payer: 59 | Attending: Emergency Medicine | Admitting: Emergency Medicine

## 2019-09-17 DIAGNOSIS — M5416 Radiculopathy, lumbar region: Secondary | ICD-10-CM

## 2019-09-17 DIAGNOSIS — M545 Low back pain: Secondary | ICD-10-CM | POA: Diagnosis present

## 2019-09-17 DIAGNOSIS — Z87891 Personal history of nicotine dependence: Secondary | ICD-10-CM | POA: Insufficient documentation

## 2019-09-17 NOTE — ED Triage Notes (Signed)
Pt here for further eval of worsening L lower back pain since hearing a pop and developing pain after stepping into the bath tub on 7/13. Seen at [facility] for same, followed up with specialist, on steroid taper but pain is significantly worse in last 24 hours. Appears extremely uncomfortable in triage.

## 2019-09-18 ENCOUNTER — Emergency Department (HOSPITAL_COMMUNITY): Payer: 59

## 2019-09-18 IMAGING — MR MR LUMBAR SPINE W/O CM
4 of 5 series · 27 of 48 positions shown · non-contrast
Comparison: Lumbar spine MRI [DATE] from [REDACTED] [facility]

CLINICAL DATA: Worsening left low back pain after hearing a pop
while stepping into the bathtub on [DATE].

EXAM:
MRI LUMBAR SPINE WITHOUT CONTRAST
TECHNIQUE: Multiplanar, multisequence MR imaging of the lumbar spine was
performed. No intravenous contrast was administered.

[Series 5: T2 · sagittal · 4.0mm · 0.73mm/px · 6 of 17 slices shown (1 of 2)]
[im 1/17]
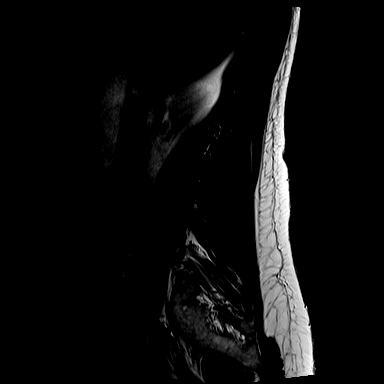
[im 4/17]
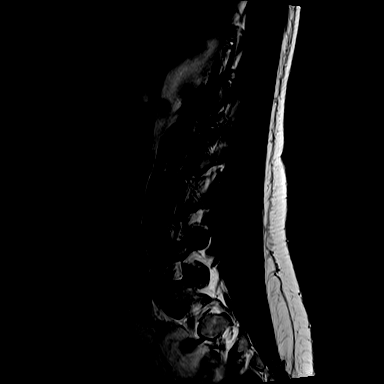
[im 7/17]
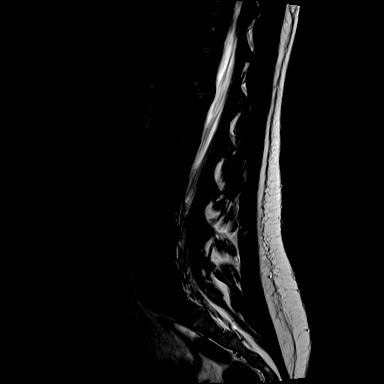
[im 10/17]
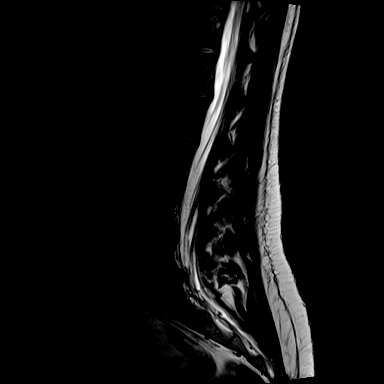
[im 13/17]
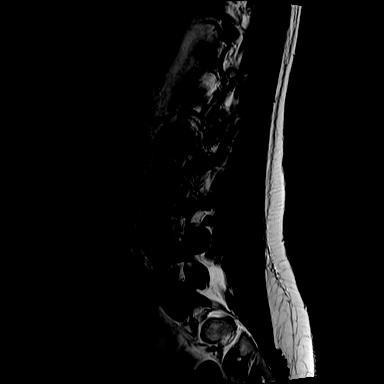
[im 17/17]
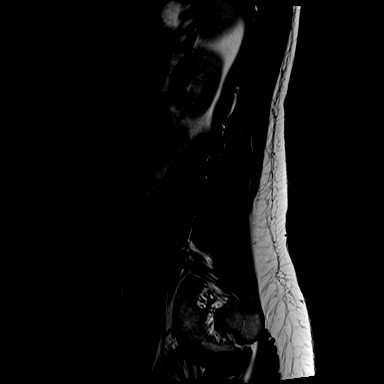

[Series 7: T1 · sagittal · 4.0mm · 0.88mm/px · 7 of 17 slices shown (1 of 2)]
[im 1/17]
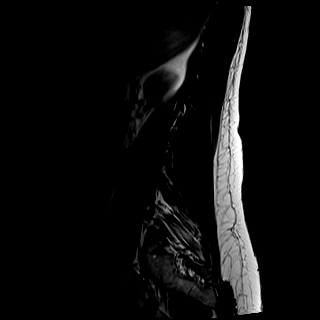
[im 3/17]
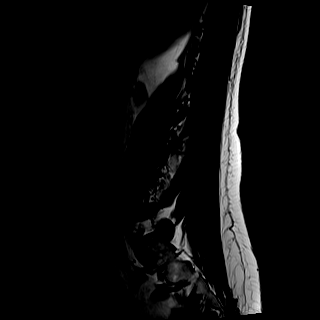
[im 6/17]
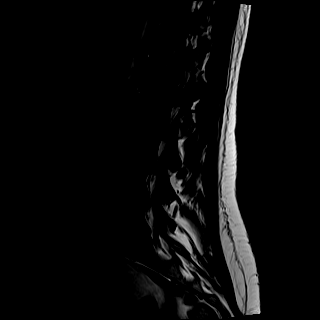
[im 9/17]
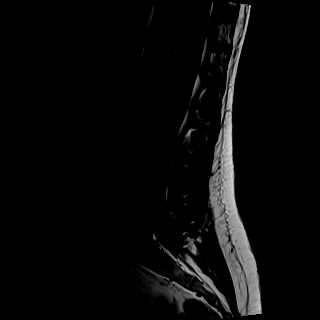
[im 11/17]
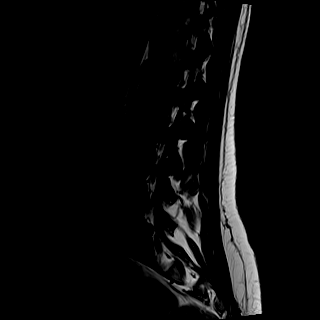
[im 14/17]
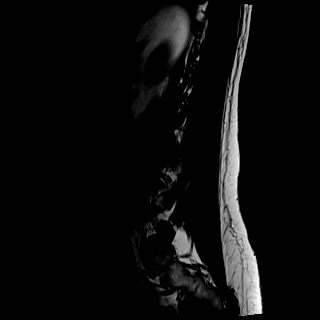
[im 17/17]
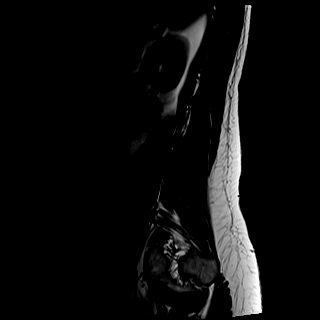

[Series 8: T2 · axial · 4.0mm · 0.57mm/px · z∈[-96,+78]mm · 8 of 34 slices shown (2 of 2)]
[im 1/34]
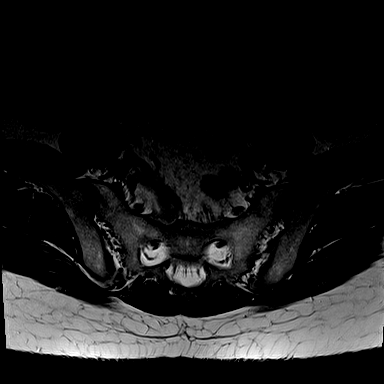
[im 6/34]
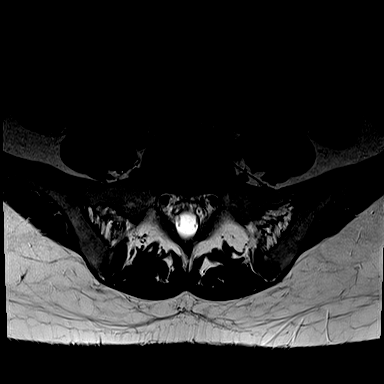
[im 11/34]
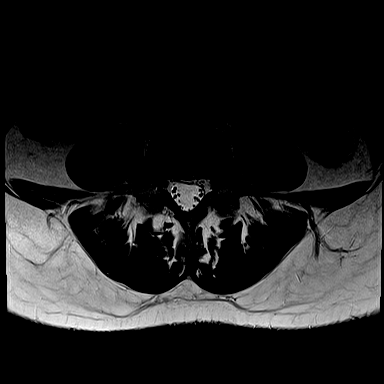
[im 16/34]
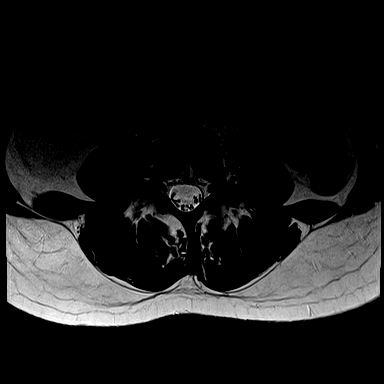
[im 18/34]
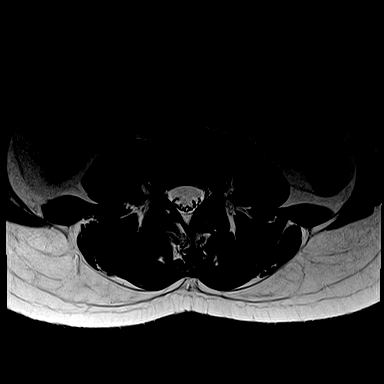
[im 23/34]
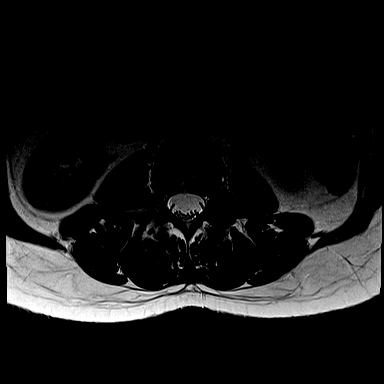
[im 28/34]
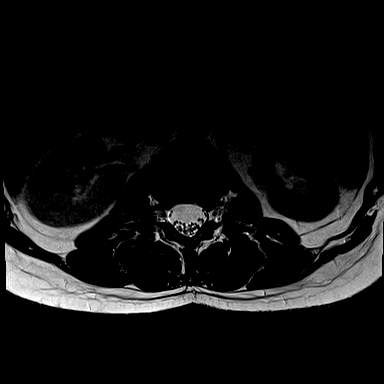
[im 34/34]
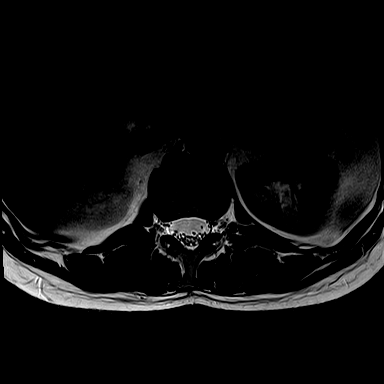

[Series 9: T1 · axial · 4.0mm · 0.34mm/px · z∈[-96,+48]mm · 6 of 34 slices shown (2 of 2)]
[im 1/34]
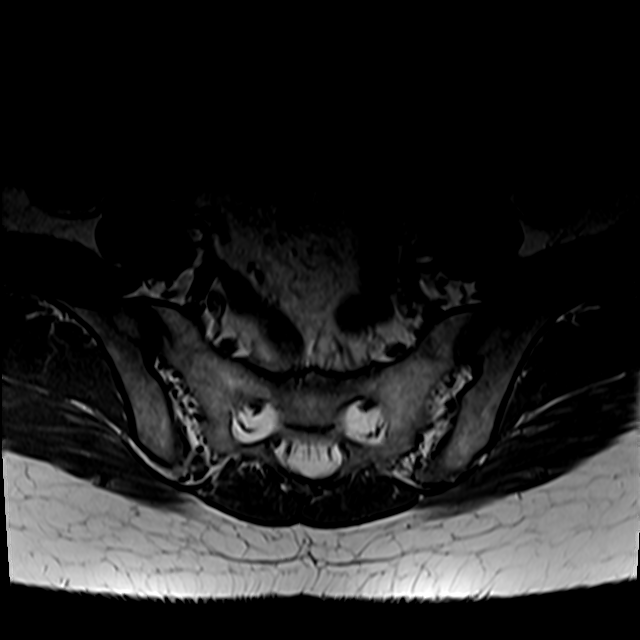
[im 6/34]
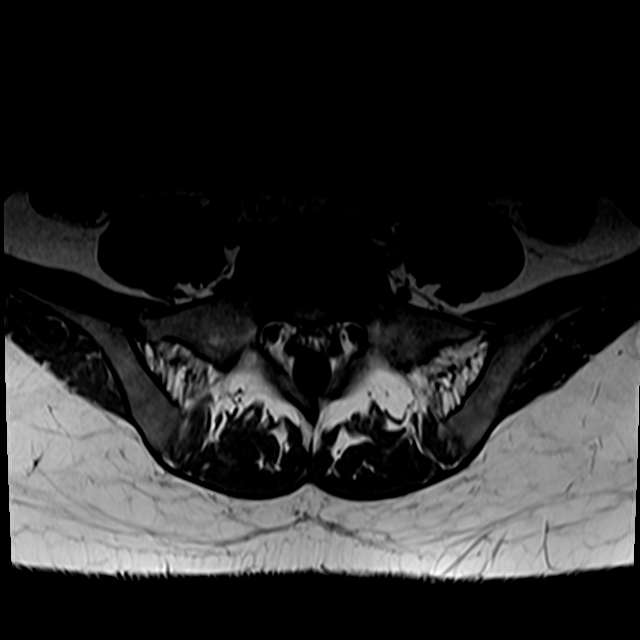
[im 11/34]
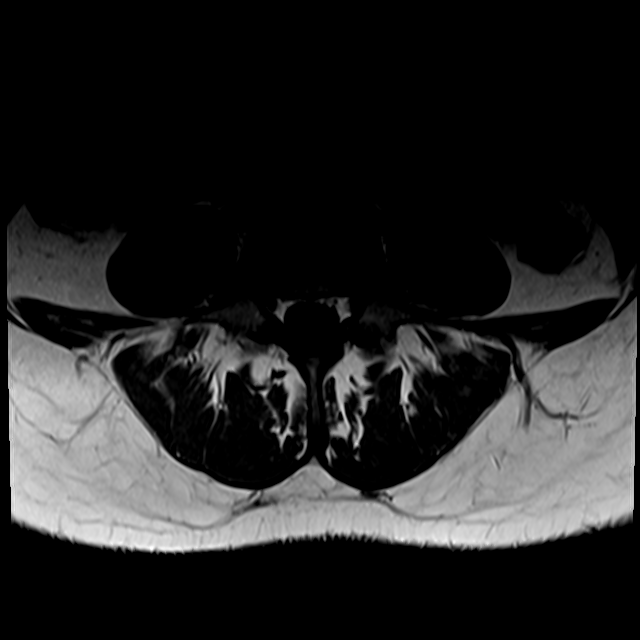
[im 16/34]
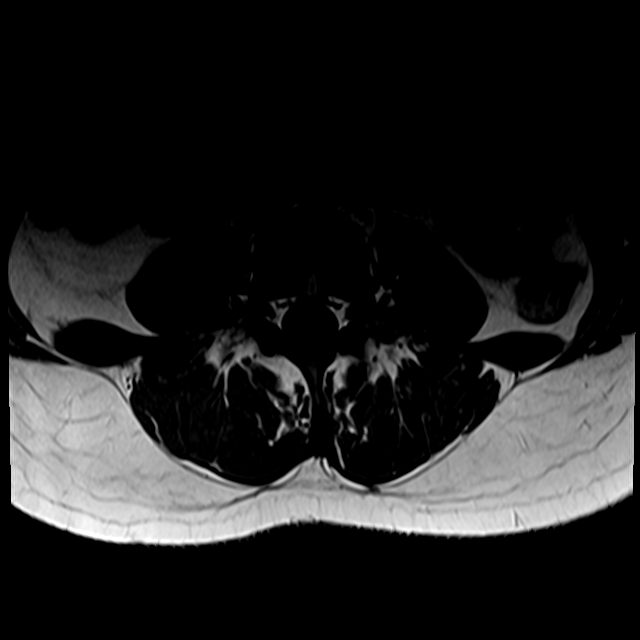
[im 18/34]
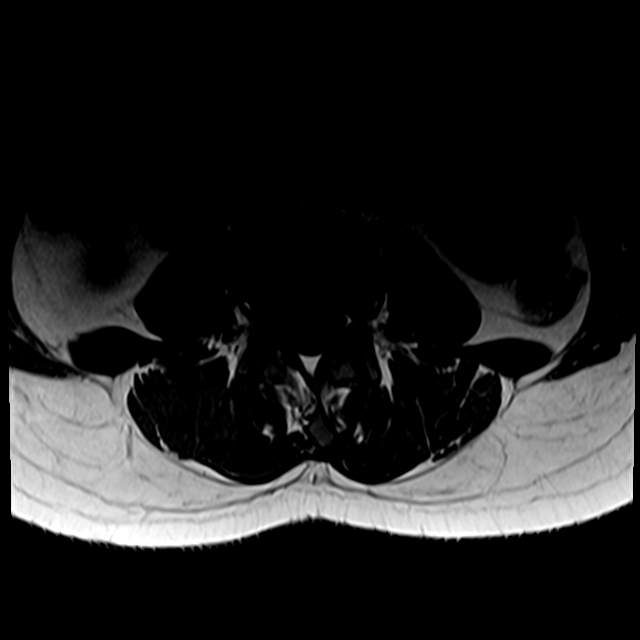
[im 28/34]
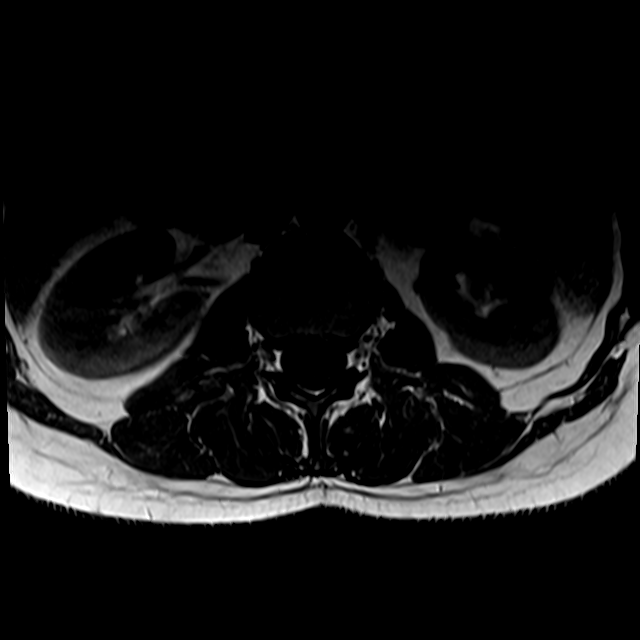

[27 of 48 positions shown; findings below may reference images not displayed]

FINDINGS: Segmentation:  Standard.

Alignment:  Normal.

Vertebrae: No fracture, suspicious osseous lesion, or significant
marrow edema.

Conus medullaris and cauda equina: Conus extends to the T12-L1
level. Conus and cauda equina appear normal.

Paraspinal and other soft tissues: Unremarkable.

Disc levels:

L1-2 through L4-5: Negative.

L5-S1: New/progressive disc desiccation. A left subarticular to left
foraminal disc protrusion is similar in size to the prior study and
results in mild-to-moderate left lateral recess stenosis with
potential left S1 nerve root impingement. There is minimal disc
bulging. No spinal or neural foraminal stenosis.
IMPRESSION: Unchanged L5-S1 disc protrusion with left lateral recess stenosis
and potential left S1 nerve root impingement.

## 2019-09-18 MED ORDER — ONDANSETRON HCL 4 MG/2ML IJ SOLN
4.0000 mg | Freq: Once | INTRAMUSCULAR | Status: AC
  Administered 2019-09-18: 4 mg via INTRAVENOUS
  Filled 2019-09-18: qty 2

## 2019-09-18 MED ORDER — PREDNISONE 20 MG PO TABS
ORAL_TABLET | ORAL | 0 refills | Status: DC
Start: 2019-09-18 — End: 2019-10-30

## 2019-09-18 MED ORDER — HYDROMORPHONE HCL 1 MG/ML IJ SOLN
1.0000 mg | Freq: Once | INTRAMUSCULAR | Status: AC | PRN
  Administered 2019-09-18: 1 mg via INTRAVENOUS
  Filled 2019-09-18: qty 1

## 2019-09-18 MED ORDER — ORPHENADRINE CITRATE ER 100 MG PO TB12
100.0000 mg | ORAL_TABLET | Freq: Two times a day (BID) | ORAL | 0 refills | Status: DC
Start: 2019-09-18 — End: 2019-10-30

## 2019-09-18 MED ORDER — HYDROMORPHONE HCL 1 MG/ML IJ SOLN
1.0000 mg | Freq: Once | INTRAMUSCULAR | Status: AC
  Administered 2019-09-18: 1 mg via INTRAVENOUS
  Filled 2019-09-18: qty 1

## 2019-09-18 NOTE — ED Notes (Signed)
RN has called MRI and informed that there will be a 2 hour wait for MRI

## 2019-09-18 NOTE — ED Provider Notes (Signed)
[facility] Provider Note   CSN: [id number] Arrival date & time: 09/17/19  1709     History Chief Complaint  Patient presents with  . Back Pain    Rachel Griffith is a 34 y.o. female.  Patient presents to the emergency department for evaluation of back pain.  Reports that she has had progressively worsening back pain for some time.  She initially felt a pop in her low back when she took a step and has had severe pain since.  She has been on courses of steroids with only minimal improvement.  She has not had any change in bowel or bladder function.  No loss of sensation in lower extremities.  She does have pain that goes all the way down to her ankle when she moves the leg.        Past Medical History:  Diagnosis Date  . Anxiety   . Back injury   . Chronic pelvic pain in female   . Endometriosis   . Endometriosis determined by laparoscopy 10/06/2014  . Ovarian cyst   . Pelvic pain in female 10/06/2014  . PONV (postoperative nausea and vomiting)    pt states that with 1st arthroscopy she "passed out and couldnt left her head" but they tell me it was a combination of the pain meds and anesthesia" No problems with next surgery. Not sure of meds given in anesthesia.  Marland Kitchen. POTS (postural orthostatic tachycardia syndrome)    pt reports this was diagnosed a couple of months ago by PCP (Dr Annia FriendlyBeard)  . PTSD (post-traumatic stress disorder)     Patient Active Problem List   Diagnosis Date Noted  . Left ovarian cyst 07/31/2018  . Ovarian cyst, bilateral 02/08/2016  . Postoperative abdominal pain 11/24/2014  . Status post laparoscopic assisted vaginal hysterectomy (LAVH) 11/23/2014  . Pelvic pain in female 10/06/2014  . Endometriosis determined by laparoscopy 10/06/2014    Past Surgical History:  Procedure Laterality Date  . ABDOMINAL HYSTERECTOMY    . endometrosis  2014   [facility]geration-[facility]  . KNEE SURGERY Bilateral    x2  . LAPAROSCOPIC  BILATERAL SALPINGO OOPHERECTOMY Left 07/31/2018   Procedure: LAPAROSCOPIC LEFT SALPINGO OOPHORECTOMY;  Surgeon: Lazaro ArmsEure, Luther H, MD;  Location: [facility];  Service: Gynecology;  Laterality: Left;  . LAPAROSCOPY N/A 11/26/2014   Procedure: DIAGNOSTIC LAPAROSCOPY;  Surgeon: Tilda BurrowJohn Ferguson V, MD;  Location: [facility];  Service: Gynecology;  Laterality: N/A;     OB History    Gravida  2   Para  2   Term  2   Preterm      AB      Living  2     SAB      TAB      Ectopic      Multiple      Live Births              Family History  Problem Relation Age of Onset  . Cancer Father        prostate  . Other Sister        IBS  . Other Brother        Lyme Disease  . Dementia Maternal Grandmother   . Parkinson's disease Maternal Grandfather   . Thrombocytopenia Sister     Social History   Tobacco Use  . Smoking status: Former Smoker    Packs/day: 0.50    Types: Cigarettes, E-cigarettes    Quit date: 02/28/2019  Years since quitting: 0.5  . Smokeless tobacco: Never Used  Vaping Use  . Vaping Use: Former  Substance Use Topics  . Alcohol use: Yes    Comment: occ  . Drug use: No    Home Medications Prior to Admission medications   Medication Sig Start Date End Date Taking? Authorizing Provider  albuterol (VENTOLIN HFA) 108 (90 Base) MCG/ACT inhaler Inhale 2 puffs into the lungs every 4 (four) hours as needed. 04/19/19  Yes [provider]  ALPRAZolam Prudy Feeler) 0.5 MG tablet Take 0.5 mg by mouth daily as needed for anxiety.    Yes [provider]  amphetamine-dextroamphetamine (ADDERALL) 20 MG tablet Take 20 mg by mouth 2 (two) times daily.   Yes [provider]  APRI 0.15-30 MG-MCG tablet TAKE 1 TABLET BY MOUTH EVERY DAY Patient taking differently: Take 1 tablet by mouth daily.  06/09/19  Yes Lazaro Arms, MD  gabapentin (NEURONTIN) 600 MG tablet Take 600-1,800 mg by mouth See admin instructions. Take 1 tablet twice daily then take 3 tablets at  bedtime   Yes [provider]  ibuprofen (ADVIL) 200 MG tablet Take 800 mg by mouth every 6 (six) hours as needed for mild pain or moderate pain.    Yes [provider]  levocetirizine (XYZAL) 5 MG tablet Take 5 mg by mouth daily.   Yes [provider]  midodrine (PROAMATINE) 10 MG tablet Take 10 mg by mouth 3 (three) times daily.  01/28/19  Yes [provider]  polyethylene glycol powder (GLYCOLAX/MIRALAX) 17 GM/SCOOP powder Take 17 g by mouth daily.   Yes [provider]  vortioxetine HBr (TRINTELLIX) 10 MG TABS tablet Take 10 mg by mouth daily.   Yes [provider]  lidocaine (LIDODERM) 5 % Place 1 patch onto the skin daily. Remove & Discard patch within 12 hours or as directed by MD Patient not taking: Reported on 09/18/2019 08/02/19   Harlene Salts A, PA-C  ondansetron (ZOFRAN ODT) 4 MG disintegrating tablet Take 1 tablet (4 mg total) by mouth every 8 (eight) hours as needed for nausea or vomiting. Patient not taking: Reported on 09/18/2019 05/31/19   Burgess Amor, PA-C  orphenadrine (NORFLEX) 100 MG tablet Take 1 tablet (100 mg total) by mouth 2 (two) times daily. 09/18/19   Gilda Crease, MD  predniSONE (DELTASONE) 20 MG tablet 3 tabs po daily x 3 days, then 2 tabs x 3 days, then 1.5 tabs x 3 days, then 1 tab x 3 days, then 0.5 tabs x 3 days 09/18/19   Gilda Crease, MD    Allergies    Almond meal, Cymbalta [duloxetine hcl], Sulfa antibiotics, Trazodone and nefazodone, and Sudafed [pseudoephedrine hcl]  Review of Systems   Review of Systems  Musculoskeletal: Positive for back pain.  All other systems reviewed and are negative.   Physical Exam Updated Vital Signs BP 124/81   Pulse 63   Temp 98.5 F (36.9 C) (Oral)   Resp 16   Ht 5\' 9"  (1.753 m)   Wt 68 kg   LMP 09/19/2014 Comment: post LAVH  SpO2 97%   BMI 22.15 kg/m   Physical Exam Vitals and nursing note reviewed.  Constitutional:      General: She is  not in acute distress.    Appearance: Normal appearance. She is well-developed.  HENT:     Head: Normocephalic and atraumatic.     Right Ear: Hearing normal.     Left Ear: Hearing normal.  Nose: Nose normal.  Eyes:     Conjunctiva/sclera: Conjunctivae normal.     Pupils: Pupils are equal, round, and reactive to light.  Cardiovascular:     Rate and Rhythm: Regular rhythm.     Heart sounds: S1 normal and S2 normal. No murmur heard.  No friction rub. No gallop.   Pulmonary:     Effort: Pulmonary effort is normal. No respiratory distress.     Breath sounds: Normal breath sounds.  Chest:     Chest wall: No tenderness.  Abdominal:     General: Bowel sounds are normal.     Palpations: Abdomen is soft.     Tenderness: There is no abdominal tenderness. There is no guarding or rebound. Negative signs include Murphy's sign and McBurney's sign.     Hernia: No hernia is present.  Musculoskeletal:     Cervical back: Normal range of motion and neck supple.     Lumbar back: Positive left straight leg raise test.     Comments: No saddle anesthesia, normal patellar reflexes, no foot drop  Skin:    General: Skin is warm and dry.     Findings: No rash.  Neurological:     Mental Status: She is alert and oriented to person, place, and time.     GCS: GCS eye subscore is 4. GCS verbal subscore is 5. GCS motor subscore is 6.     Cranial Nerves: No cranial nerve deficit.     Sensory: No sensory deficit.     Coordination: Coordination normal.  Psychiatric:        Speech: Speech normal.        Behavior: Behavior normal.        Thought Content: Thought content normal.     ED Results / Procedures / Treatments   Labs (all labs ordered are listed, but only abnormal results are displayed) Labs Reviewed - No data to display  EKG None  Radiology MR LUMBAR SPINE WO CONTRAST  Result Date: 09/18/2019 CLINICAL DATA:  Worsening left low back pain after hearing a pop while stepping into the bathtub  on 09/08/2019. EXAM: MRI LUMBAR SPINE WITHOUT CONTRAST TECHNIQUE: Multiplanar, multisequence MR imaging of the lumbar spine was performed. No intravenous contrast was administered. COMPARISON:  Lumbar spine MRI 10/17/2015 from [facility] FINDINGS: Segmentation:  Standard. Alignment:  Normal. Vertebrae: No fracture, suspicious osseous lesion, or significant marrow edema. Conus medullaris and cauda equina: Conus extends to the T12-L1 level. Conus and cauda equina appear normal. Paraspinal and other soft tissues: Unremarkable. Disc levels: L1-2 through L4-5: Negative. L5-S1: New/progressive disc desiccation. A left subarticular to left foraminal disc protrusion is similar in size to the prior study and results in mild-to-moderate left lateral recess stenosis with potential left S1 nerve root impingement. There is minimal disc bulging. No spinal or neural foraminal stenosis. IMPRESSION: Unchanged L5-S1 disc protrusion with left lateral recess stenosis and potential left S1 nerve root impingement. Electronically Signed   By: Sebastian Ache M.D.   On: 09/18/2019 06:28    Procedures Procedures (including critical care time)  Medications Ordered in ED Medications  HYDROmorphone (DILAUDID) injection 1 mg (1 mg Intravenous Given 09/18/19 0028)  ondansetron (ZOFRAN) injection 4 mg (4 mg Intravenous Given 09/18/19 0029)  HYDROmorphone (DILAUDID) injection 1 mg (1 mg Intravenous Given 09/18/19 0523)    ED Course  I have reviewed the triage vital signs and the nursing notes.  Pertinent labs & imaging results that were available during my care  of the patient were reviewed by me and considered in my medical decision making (see chart for details).    MDM Rules/Calculators/A&P                          Patient presents to the emergency department with complaints of back pain.  Patient reports onset of pain with a movement of her leg causing a popping sensation and has had severe pain since.   She did have some improvement with steroid taper but she is coming off the steroids now and pain is worsening.  She does have normal reflexes.  No foot drop, no saddle anesthesia.  She has positive straight leg raise on the left but no other neurologic findings.  MRI does show small bulge at L5-S1 but unchanged from previous, no emergent neurosurgical findings.  Will treat symptomatically, restart steroid taper.  Follow-up with primary care, possible referral to physical therapy.  Final Clinical Impression(s) / ED Diagnoses Final diagnoses:  Lumbar radiculopathy    Rx / DC Orders ED Discharge Orders         Ordered    predniSONE (DELTASONE) 20 MG tablet     Discontinue  Reprint     09/18/19 0642    orphenadrine (NORFLEX) 100 MG tablet  2 times daily     Discontinue  Reprint     09/18/19 1751           Gilda Crease, MD 09/18/19 [phone]

## 2019-10-13 ENCOUNTER — Ambulatory Visit
Admission: EM | Admit: 2019-10-13 | Discharge: 2019-10-13 | Disposition: A | Discharge status: Home / Self Care | Payer: 59

## 2019-10-13 ENCOUNTER — Other Ambulatory Visit: Payer: Self-pay

## 2019-10-13 NOTE — ED Triage Notes (Signed)
Pt has pots and having increase in heart rate and would like IV fluids . Provider made aware

## 2019-10-13 NOTE — ED Triage Notes (Signed)
Provider made aware. Pt stable and able to tolerate po fluids, pt will continue po fluids and follow up with pcp

## 2019-10-30 ENCOUNTER — Encounter: Payer: Self-pay | Admitting: Internal Medicine

## 2019-10-30 ENCOUNTER — Other Ambulatory Visit: Payer: Self-pay

## 2019-10-30 ENCOUNTER — Ambulatory Visit: Payer: 59 | Admitting: Internal Medicine

## 2019-10-30 VITALS — BP 98/52 | HR 67 | Ht 69.0 in | Wt 162.0 lb

## 2019-10-30 DIAGNOSIS — Z79899 Other long term (current) drug therapy: Secondary | ICD-10-CM

## 2019-10-30 MED ORDER — FLUDROCORTISONE ACETATE 0.1 MG PO TABS: 0.1000 mg | ORAL_TABLET | Freq: Two times a day (BID) | ORAL | 11 refills | Status: DC

## 2019-10-30 NOTE — Progress Notes (Signed)
HPI Rachel Griffith is referred today by Dr. Margo Aye for evaluation of autonomic dysfunction. She has a long h/o POTS type symptoms and near syncope. She has been treated in the past in [facility] with escalating doses of florinef and midodrine. She has periods where he symptoms are fairly well controlled. She has learned that if she lies down her symptoms will improve and she will avoid passing out. She notes exertional HR's in the 180 range. She has at times not done as well as she could with regard to salt intake. She has a lot of anxiety. Allergies  Allergen Reactions  . Almond Meal Anaphylaxis  . Cymbalta [Duloxetine Hcl]   . Sulfa Antibiotics Hives  . Trazodone And Nefazodone     Sever body pain   . Sudafed [Pseudoephedrine Hcl] Palpitations     Current Outpatient Medications  Medication Sig Dispense Refill  . albuterol (VENTOLIN HFA) 108 (90 Base) MCG/ACT inhaler Inhale 2 puffs into the lungs every 4 (four) hours as needed.    . ALPRAZolam (XANAX) 0.5 MG tablet Take 0.5 mg by mouth daily as needed for anxiety.     Marland Kitchen amphetamine-dextroamphetamine (ADDERALL) 20 MG tablet Take 20 mg by mouth 2 (two) times daily.    . APRI 0.15-30 MG-MCG tablet TAKE 1 TABLET BY MOUTH EVERY DAY (Patient taking differently: Take 1 tablet by mouth daily. ) 28 tablet 13  . gabapentin (NEURONTIN) 600 MG tablet Take 600-1,800 mg by mouth See admin instructions. Take 1 tablet twice daily then take 3 tablets at bedtime    . ibuprofen (ADVIL) 200 MG tablet Take 800 mg by mouth every 6 (six) hours as needed for mild pain or moderate pain.     Marland Kitchen levocetirizine (XYZAL) 5 MG tablet Take 5 mg by mouth daily.    . midodrine (PROAMATINE) 10 MG tablet Take 10 mg by mouth 3 (three) times daily.     . ondansetron (ZOFRAN ODT) 4 MG disintegrating tablet Take 1 tablet (4 mg total) by mouth every 8 (eight) hours as needed for nausea or vomiting. 30 tablet 0  . polyethylene glycol powder (GLYCOLAX/MIRALAX) 17 GM/SCOOP  powder Take 17 g by mouth daily.    Marland Kitchen vortioxetine HBr (TRINTELLIX) 10 MG TABS tablet Take 10 mg by mouth daily.     No current facility-administered medications for this visit.     Past Medical History:  Diagnosis Date  . Anxiety   . Back injury   . Chronic pelvic pain in female   . Endometriosis   . Endometriosis determined by laparoscopy 10/06/2014  . Ovarian cyst   . Pelvic pain in female 10/06/2014  . PONV (postoperative nausea and vomiting)    pt states that with 1st arthroscopy she "passed out and couldnt left her head" but they tell me it was a combination of the pain meds and anesthesia" No problems with next surgery. Not sure of meds given in anesthesia.  Marland Kitchen POTS (postural orthostatic tachycardia syndrome)    pt reports this was diagnosed a couple of months ago by PCP (Dr Annia Friendly)  . PTSD (post-traumatic stress disorder)     ROS:   All systems reviewed and negative except as noted in the HPI.   Past Surgical History:  Procedure Laterality Date  . ABDOMINAL HYSTERECTOMY    . endometrosis  2014   [facility]geration-[facility]  . KNEE SURGERY Bilateral    x2  . LAPAROSCOPIC BILATERAL SALPINGO OOPHERECTOMY Left 07/31/2018   Procedure: LAPAROSCOPIC LEFT SALPINGO  OOPHORECTOMY;  Surgeon: Lazaro Arms, MD;  Location: [facility];  Service: Gynecology;  Laterality: Left;  . LAPAROSCOPY N/A 11/26/2014   Procedure: DIAGNOSTIC LAPAROSCOPY;  Surgeon: [name], MD;  Location: [facility];  Service: Gynecology;  Laterality: N/A;     Family History  Problem Relation Age of Onset  . Cancer Father        prostate  . Other Sister        IBS  . Other Brother        Lyme Disease  . Dementia Maternal Grandmother   . Parkinson's disease Maternal Grandfather   . Thrombocytopenia Sister      Social History   Socioeconomic History  . Marital status: Divorced    Spouse name: Not on file  . Number of children: Not on file  . Years of education: Not on file  . Highest education level: Not on  file  Occupational History  . Not on file  Tobacco Use  . Smoking status: Former Smoker    Packs/day: 0.50    Types: Cigarettes, E-cigarettes    Quit date: 02/28/2019    Years since quitting: 0.6  . Smokeless tobacco: Never Used  Vaping Use  . Vaping Use: Former  Substance and Sexual Activity  . Alcohol use: Yes    Comment: occ  . Drug use: No  . Sexual activity: Not Currently    Birth control/protection: Surgical  Other Topics Concern  . Not on file  Social History Narrative  . Not on file   Social Determinants of Health   Financial Resource Strain:   . Difficulty of Paying Living Expenses: Not on file  Food Insecurity:   . Worried About Programme researcher, broadcasting/film/video in the Last Year: Not on file  . Ran Out of Food in the Last Year: Not on file  Transportation Needs:   . Lack of Transportation (Medical): Not on file  . Lack of Transportation (Non-Medical): Not on file  Physical Activity:   . Days of Exercise per Week: Not on file  . Minutes of Exercise per Session: Not on file  Stress:   . Feeling of Stress : Not on file  Social Connections:   . Frequency of Communication with Friends and Family: Not on file  . Frequency of Social Gatherings with Friends and Family: Not on file  . Attends Religious Services: Not on file  . Active Member of Clubs or Organizations: Not on file  . Attends Banker Meetings: Not on file  . Marital Status: Not on file  Intimate Partner Violence:   . Fear of Current or Ex-Partner: Not on file  . Emotionally Abused: Not on file  . Physically Abused: Not on file  . Sexually Abused: Not on file     BP (!) 98/52   Pulse 67   Ht 5\' 9"  (1.753 m)   Wt 162 lb (73.5 kg)   LMP 09/19/2014 Comment: post LAVH  SpO2 99%   BMI 23.92 kg/m   Physical Exam:  Anxious appearing 34 yo woman, NAD HEENT: Unremarkable Neck:  6 cm JVD, no thyromegally Lymphatics:  No adenopathy Back:  No CVA tenderness Lungs:  Clear with no wheezs HEART:   Regular rate rhythm, no murmurs, no rubs, no clicks Abd:  soft, positive bowel sounds, no organomegally, no rebound, no guarding Ext:  2 plus pulses, no edema, no cyanosis, no clubbing Skin:  No rashes no nodules Neuro:  CN II through XII intact, motor  grossly intact  EKG - reviewed. NSR   Assess/Plan: 1. Autonomic dysfunction - she has a mix of symptoms. She is on fairly high dose midodrine. I have recommended she take a lower dose of florinef. I encouraged her to increase the salt in her diet.  2. Hypokalemia - she has had some problems with this on the higher doses of florinef. I encouraged her to increase her potassium intake and we will have her check a bmp in a week or two.  Sharlot Gowda Harshita Bernales,MD

## 2019-10-30 NOTE — Patient Instructions (Signed)
Medication Instructions:  Your physician has recommended you make the following change in your medication:   Start Florinef 0.1 mg Two Times Daily   *If you need a refill on your cardiac medications before your next appointment, please call your pharmacy*   Lab Work: Your physician recommends that you return for lab work in: 2 Weeks   If you have labs (blood work) drawn today and your tests are completely normal, you will receive your results only by: Marland Kitchen MyChart Message (if you have MyChart) OR . A paper copy in the mail If you have any lab test that is abnormal or we need to change your treatment, we will call you to review the results.   Testing/Procedures: NONE    Follow-Up: At [facility], you and your health needs are our priority.  As part of our continuing mission to provide you with exceptional heart care, we have created designated Provider Care Teams.  These Care Teams include your primary Cardiologist (physician) and Advanced Practice Providers (APPs -  Physician Assistants and Nurse Practitioners) who all work together to provide you with the care you need, when you need it.  We recommend signing up for the patient portal called "MyChart".  Sign up information is provided on this After Visit Summary.  MyChart is used to connect with patients for Virtual Visits (Telemedicine).  Patients are able to view lab/test results, encounter notes, upcoming appointments, etc.  Non-urgent messages can be sent to your provider as well.   To learn more about what you can do with MyChart, go to [email or web address].    Your next appointment:   6 month(s)  The format for your next appointment:   In Person  Provider:   Lewayne Bunting, MD   Other Instructions Thank you for choosing [facility]!

## 2019-12-31 ENCOUNTER — Ambulatory Visit: Payer: 59 | Admitting: Orthopedic Surgery

## 2020-03-30 ENCOUNTER — Other Ambulatory Visit: Payer: Self-pay | Admitting: Obstetrics & Gynecology

## 2020-04-15 ENCOUNTER — Ambulatory Visit
Admission: EM | Admit: 2020-04-15 | Discharge: 2020-04-15 | Disposition: A | Discharge status: Home / Self Care | Payer: 59 | Attending: Family Medicine | Admitting: Family Medicine

## 2020-04-15 ENCOUNTER — Encounter: Payer: Self-pay | Admitting: Emergency Medicine

## 2020-04-15 ENCOUNTER — Other Ambulatory Visit: Payer: Self-pay

## 2020-04-15 DIAGNOSIS — T148XXA Other injury of unspecified body region, initial encounter: Secondary | ICD-10-CM | POA: Diagnosis not present

## 2020-04-15 DIAGNOSIS — M545 Low back pain, unspecified: Secondary | ICD-10-CM

## 2020-04-15 DIAGNOSIS — M62838 Other muscle spasm: Secondary | ICD-10-CM

## 2020-04-15 MED ORDER — KETOROLAC TROMETHAMINE 30 MG/ML IJ SOLN
30.0000 mg | Freq: Once | INTRAMUSCULAR | Status: AC
  Administered 2020-04-15: 30 mg via INTRAMUSCULAR

## 2020-04-15 MED ORDER — PREDNISONE 10 MG (21) PO TBPK: ORAL_TABLET | Freq: Every day | ORAL | 0 refills | Status: AC

## 2020-04-15 MED ORDER — CYCLOBENZAPRINE HCL 10 MG PO TABS: 10.0000 mg | ORAL_TABLET | Freq: Two times a day (BID) | ORAL | 0 refills | Status: DC | PRN

## 2020-04-15 MED ORDER — DEXAMETHASONE SODIUM PHOSPHATE 10 MG/ML IJ SOLN
10.0000 mg | Freq: Once | INTRAMUSCULAR | Status: AC
  Administered 2020-04-15: 10 mg via INTRAMUSCULAR

## 2020-04-15 NOTE — Discharge Instructions (Addendum)
You have received toradol and decadron for pain and inflammation in the office today.  I have sent in a prednisone taper for you to take for 6 days. 6 tablets on day one, 5 tablets on day two, 4 tablets on day three, 3 tablets on day four, 2 tablets on day five, and 1 tablet on day six.  I have sent in flexeril for you to take twice a day as needed for muscle spasms. This medication can make you sleepy. Do not drive or operate heavy machinery with this medication.  Follow up with this office or with primary care if symptoms are persisting.  Follow up in the ER for high fever, trouble swallowing, trouble breathing, other concerning symptoms.

## 2020-04-15 NOTE — ED Triage Notes (Signed)
Pt reports she has hx of bulging disc and is having pain to LT lower back that flared up x 3 days ago.  States she couldn't get an appt with regular Dr

## 2020-04-15 NOTE — ED Provider Notes (Signed)
[facility]   [id number] 04/15/20 Arrival Time: 1240  [id number] PAIN  SUBJECTIVE: History from: patient. Rachel Griffith is a 35 y.o. female complains of left low back pain that began 3 days ago. Reports hx bulging disc. Denies a precipitating event or specific injury. Describes the pain as constant and achy in character.  Has tried OTC medications without relief. Symptoms are made worse with activity. Reports similar symptoms in the past. Denies fever, chills, erythema, ecchymosis, effusion, weakness, numbness and tingling, saddle paresthesias, loss of bowel or bladder function.      ROS: As per HPI.  All other pertinent ROS negative.     Past Medical History:  Diagnosis Date  . Anxiety   . Back injury   . Chronic pelvic pain in female   . Endometriosis   . Endometriosis determined by laparoscopy 10/06/2014  . Ovarian cyst   . Pelvic pain in female 10/06/2014  . PONV (postoperative nausea and vomiting)    pt states that with 1st arthroscopy she "passed out and couldnt left her head" but they tell me it was a combination of the pain meds and anesthesia" No problems with next surgery. Not sure of meds given in anesthesia.  Marland Kitchen POTS (postural orthostatic tachycardia syndrome)    pt reports this was diagnosed a couple of months ago by PCP (Dr Annia Friendly)  . PTSD (post-traumatic stress disorder)    Past Surgical History:  Procedure Laterality Date  . ABDOMINAL HYSTERECTOMY    . endometrosis  2014   fulgeration-[facility]  . KNEE SURGERY Bilateral    x2  . LAPAROSCOPIC BILATERAL SALPINGO OOPHERECTOMY Left 07/31/2018   Procedure: LAPAROSCOPIC LEFT SALPINGO OOPHORECTOMY;  Surgeon: Lazaro Arms, MD;  Location: [facility];  Service: Gynecology;  Laterality: Left;  . LAPAROSCOPY N/A 11/26/2014   Procedure: DIAGNOSTIC LAPAROSCOPY;  Surgeon: [name], MD;  Location: [facility];  Service: Gynecology;  Laterality: N/A;   Allergies  Allergen Reactions  . Almond Meal Anaphylaxis  . Cymbalta  [Duloxetine Hcl]   . Sulfa Antibiotics Hives  . Trazodone And Nefazodone     Sever body pain   . Sudafed [Pseudoephedrine Hcl] Palpitations   No current facility-administered medications on file prior to encounter.   Current Outpatient Medications on File Prior to Encounter  Medication Sig Dispense Refill  . albuterol (VENTOLIN HFA) 108 (90 Base) MCG/ACT inhaler Inhale 2 puffs into the lungs every 4 (four) hours as needed.    . ALPRAZolam (XANAX) 0.5 MG tablet Take 0.5 mg by mouth daily as needed for anxiety.     Marland Kitchen amphetamine-dextroamphetamine (ADDERALL) 20 MG tablet Take 20 mg by mouth 2 (two) times daily.    . APRI 0.15-30 MG-MCG tablet TAKE 1 TABLET BY MOUTH EVERY DAY 28 tablet 13  . fludrocortisone (FLORINEF) 0.1 MG tablet Take 1 tablet (0.1 mg total) by mouth 2 (two) times daily. 60 tablet 11  . gabapentin (NEURONTIN) 600 MG tablet Take 600-1,800 mg by mouth See admin instructions. Take 1 tablet twice daily then take 3 tablets at bedtime    . ibuprofen (ADVIL) 200 MG tablet Take 800 mg by mouth every 6 (six) hours as needed for mild pain or moderate pain.     Marland Kitchen levocetirizine (XYZAL) 5 MG tablet Take 5 mg by mouth daily.    . midodrine (PROAMATINE) 10 MG tablet Take 10 mg by mouth 3 (three) times daily.     . ondansetron (ZOFRAN ODT) 4 MG disintegrating tablet Take 1 tablet (4 mg  total) by mouth every 8 (eight) hours as needed for nausea or vomiting. 30 tablet 0  . polyethylene glycol powder (GLYCOLAX/MIRALAX) 17 GM/SCOOP powder Take 17 g by mouth daily.    Marland Kitchen vortioxetine HBr (TRINTELLIX) 10 MG TABS tablet Take 10 mg by mouth daily.     Social History   Socioeconomic History  . Marital status: Divorced    Spouse name: Not on file  . Number of children: Not on file  . Years of education: Not on file  . Highest education level: Not on file  Occupational History  . Not on file  Tobacco Use  . Smoking status: Former Smoker    Packs/day: 0.50    Types: Cigarettes, E-cigarettes     Quit date: 02/28/2019    Years since quitting: 1.1  . Smokeless tobacco: Never Used  Vaping Use  . Vaping Use: Former  Substance and Sexual Activity  . Alcohol use: Yes    Comment: occ  . Drug use: No  . Sexual activity: Not Currently    Birth control/protection: Surgical  Other Topics Concern  . Not on file  Social History Narrative  . Not on file   Social Determinants of Health   Financial Resource Strain: Not on file  Food Insecurity: Not on file  Transportation Needs: Not on file  Physical Activity: Not on file  Stress: Not on file  Social Connections: Not on file  Intimate Partner Violence: Not on file   Family History  Problem Relation Age of Onset  . Cancer Father        prostate  . Other Sister        IBS  . Other Brother        Lyme Disease  . Dementia Maternal Grandmother   . Parkinson's disease Maternal Grandfather   . Thrombocytopenia Sister     OBJECTIVE:  Vitals:   04/15/20 1300  BP: (!) 149/85  Pulse: 83  Resp: 16  Temp: 98.3 F (36.8 C)  TempSrc: Oral  SpO2: 98%    General appearance: ALERT; in no acute distress, standing in room Head: NCAT Lungs: Normal respiratory effort CV: pulses 2+ bilaterally. Cap refill < 2 seconds Musculoskeletal:  Inspection: Skin warm, dry, clear and intact No erythema, effusion  Palpation: Left low back tender to palpation and in spasm ROM: Limited ROM active and passive to back with bending, twisting and changing positions Skin: warm and dry Neurologic: Ambulates without difficulty; Sensation intact about the upper/ lower extremities Psychological: alert and cooperative; normal mood and affect  DIAGNOSTIC STUDIES:  No results found.   ASSESSMENT & PLAN:  1. Acute left-sided low back pain without sciatica   2. Muscle strain   3. Muscle spasm     Meds ordered this encounter  Medications  . ketorolac (TORADOL) 30 MG/ML injection 30 mg  . dexamethasone (DECADRON) injection 10 mg  . predniSONE  (STERAPRED UNI-PAK 21 TAB) 10 MG (21) TBPK tablet    Sig: Take by mouth daily for 6 days. Take 6 tablets on day 1, 5 tablets on day 2, 4 tablets on day 3, 3 tablets on day 4, 2 tablets on day 5, 1 tablet on day 6    Dispense:  21 tablet    Refill:  0    Order Specific Question:   Supervising Provider    Answer:   Merrilee Jansky [id number]  . cyclobenzaprine (FLEXERIL) 10 MG tablet    Sig: Take 1 tablet (10 mg total) by mouth  2 (two) times daily as needed for muscle spasms.    Dispense:  20 tablet    Refill:  0    Order Specific Question:   Supervising Provider    Answer:   Merrilee Jansky [[id number]]    Toradol 30mg  IM in office today Decadron 10mg  IM in office today Steroid taper prescribed Cyclobenzaprine prescribed Continue conservative management of rest, ice, and gentle stretches Take ibuprofen as needed for pain relief (may cause abdominal discomfort, ulcers, and GI bleeds avoid taking with other NSAIDs) Take cyclobenzaprine at nighttime for symptomatic relief. Avoid driving or operating heavy machinery while using medication. Follow up with PCP if symptoms persist Return or go to the ER if you have any new or worsening symptoms (fever, chills, chest pain, abdominal pain, changes in bowel or bladder habits, pain radiating into lower legs)  Reviewed expectations re: course of current medical issues. Questions answered. Outlined signs and symptoms indicating need for more acute intervention. Patient verbalized understanding. After Visit Summary given.       , NP 04/15/20 1316

## 2020-05-12 ENCOUNTER — Ambulatory Visit: Payer: 59 | Admitting: Internal Medicine

## 2020-05-17 ENCOUNTER — Encounter (HOSPITAL_COMMUNITY): Payer: Self-pay | Admitting: Emergency Medicine

## 2020-05-17 ENCOUNTER — Other Ambulatory Visit: Payer: Self-pay

## 2020-05-17 ENCOUNTER — Emergency Department (HOSPITAL_COMMUNITY)
Admission: EM | Admit: 2020-05-17 | Discharge: 2020-05-17 | Disposition: A | Discharge status: Home / Self Care | Payer: 59 | Attending: Emergency Medicine | Admitting: Emergency Medicine

## 2020-05-17 DIAGNOSIS — Z79899 Other long term (current) drug therapy: Secondary | ICD-10-CM | POA: Diagnosis not present

## 2020-05-17 DIAGNOSIS — E876 Hypokalemia: Secondary | ICD-10-CM

## 2020-05-17 DIAGNOSIS — R109 Unspecified abdominal pain: Secondary | ICD-10-CM | POA: Diagnosis present

## 2020-05-17 DIAGNOSIS — R1013 Epigastric pain: Secondary | ICD-10-CM | POA: Insufficient documentation

## 2020-05-17 DIAGNOSIS — Z87891 Personal history of nicotine dependence: Secondary | ICD-10-CM | POA: Insufficient documentation

## 2020-05-17 LAB — COMPREHENSIVE METABOLIC PANEL
ALT: 14 U/L (ref 0–44)
AST: 16 U/L (ref 15–41)
Albumin: 3.5 g/dL (ref 3.5–5.0)
Alkaline Phosphatase: 49 U/L (ref 38–126)
Anion gap: 9 (ref 5–15)
BUN: 14 mg/dL (ref 6–20)
CO2: 25 mmol/L (ref 22–32)
Calcium: 8.4 mg/dL — ABNORMAL LOW (ref 8.9–10.3)
Chloride: 101 mmol/L (ref 98–111)
Creatinine, Ser: 0.61 mg/dL (ref 0.44–1.00)
GFR, Estimated: >60 mL/min (ref >60)
Glucose, Bld: 95 mg/dL (ref 70–99)
Potassium: 2.9 mmol/L — ABNORMAL LOW (ref 3.5–5.1)
Sodium: 135 mmol/L (ref 135–145)
Total Bilirubin: 0.4 mg/dL (ref 0.3–1.2)
Total Protein: 6.8 g/dL (ref 6.5–8.1)

## 2020-05-17 LAB — URINALYSIS, ROUTINE W REFLEX MICROSCOPIC
Bilirubin Urine: NEGATIVE
Glucose, UA: NEGATIVE mg/dL
Hgb urine dipstick: NEGATIVE
Ketones, ur: NEGATIVE mg/dL
Leukocytes,Ua: NEGATIVE
Nitrite: NEGATIVE
Protein, ur: NEGATIVE mg/dL
Specific Gravity, Urine: 1.014 (ref 1.005–1.030)
pH: 6 (ref 5.0–8.0)

## 2020-05-17 LAB — CBC
HCT: 38.5 % (ref 36.0–46.0)
Hemoglobin: 13.6 g/dL (ref 12.0–15.0)
MCH: 32.9 pg (ref 26.0–34.0)
MCHC: 35.3 g/dL (ref 30.0–36.0)
MCV: 93 fL (ref 80.0–100.0)
Platelets: 274 10*3/uL (ref 150–400)
RBC: 4.14 MIL/uL (ref 3.87–5.11)
RDW: 11.4 % — ABNORMAL LOW (ref 11.5–15.5)
WBC: 8.8 10*3/uL (ref 4.0–10.5)
nRBC: 0 % (ref 0.0–0.2)

## 2020-05-17 LAB — LIPASE, BLOOD: Lipase: 29 U/L (ref 11–51)

## 2020-05-17 MED ORDER — ONDANSETRON 4 MG PO TBDP
4.0000 mg | ORAL_TABLET | Freq: Once | ORAL | Status: AC | PRN
  Administered 2020-05-17: 4 mg via ORAL
  Filled 2020-05-17: qty 1

## 2020-05-17 MED ORDER — POTASSIUM CHLORIDE ER 20 MEQ PO TBCR: 10.0000 meq | EXTENDED_RELEASE_TABLET | Freq: Two times a day (BID) | ORAL | 1 refills | Status: DC

## 2020-05-17 MED ORDER — POTASSIUM CHLORIDE CRYS ER 20 MEQ PO TBCR
40.0000 meq | EXTENDED_RELEASE_TABLET | Freq: Once | ORAL | Status: DC
  Filled 2020-05-17: qty 2

## 2020-05-17 NOTE — ED Triage Notes (Signed)
Pt tot the ED with generalized abdominal for the past week more severe today.

## 2020-05-17 NOTE — Discharge Instructions (Signed)
There were no serious problems found associated with your upper abdominal pain.  Consider having your primary care doctor order an ultrasound to evaluate you for a gallbladder problem.  Your potassium level was low 2.9.  We sent a prescription for potassium to your pharmacy.  Consider trying antacids such as Maalox before meals and at bedtime.

## 2020-05-17 NOTE — ED Provider Notes (Signed)
[facility] Provider Note   CSN: [id number] Arrival date & time: 05/17/20  1619     History Chief Complaint  Patient presents with  . Abdominal Pain    Rachel PinaCourtney H Shorten is a 35 y.o. female.  HPI She presents for evaluation of abdominal pain, for 1 week, felt as a "sharp clogging sensation," that comes and goes.  She has chronic nausea and takes Zofran daily.  She has previously tried PPIs, and gastric acid blockers without relief for similar pain.  She denies fever, chills, cough, shortness of breath, weakness or dizziness.  Admits to stress.  She denies heavy alcohol use.  She has previously had low potassium related to Florinef use.  There are no other known modifying factors.    Past Medical History:  Diagnosis Date  . Anxiety   . Back injury   . Chronic pelvic pain in female   . Endometriosis   . Endometriosis determined by laparoscopy 10/06/2014  . Ovarian cyst   . Pelvic pain in female 10/06/2014  . PONV (postoperative nausea and vomiting)    pt states that with 1st arthroscopy she "passed out and couldnt left her head" but they tell me it was a combination of the pain meds and anesthesia" No problems with next surgery. Not sure of meds given in anesthesia.  Marland Kitchen. POTS (postural orthostatic tachycardia syndrome)    pt reports this was diagnosed a couple of months ago by PCP (Dr Annia FriendlyBeard)  . PTSD (post-traumatic stress disorder)     Patient Active Problem List   Diagnosis Date Noted  . Left ovarian cyst 07/31/2018  . Ovarian cyst, bilateral 02/08/2016  . Postoperative abdominal pain 11/24/2014  . Status post laparoscopic assisted vaginal hysterectomy (LAVH) 11/23/2014  . Pelvic pain in female 10/06/2014  . Endometriosis determined by laparoscopy 10/06/2014    Past Surgical History:  Procedure Laterality Date  . ABDOMINAL HYSTERECTOMY    . endometrosis  2014   fulgeration-[facility]  . KNEE SURGERY Bilateral    x2  . LAPAROSCOPIC BILATERAL SALPINGO  OOPHERECTOMY Left 07/31/2018   Procedure: LAPAROSCOPIC LEFT SALPINGO OOPHORECTOMY;  Surgeon: Lazaro ArmsEure, Luther H, MD;  Location: [facility];  Service: Gynecology;  Laterality: Left;  . LAPAROSCOPY N/A 11/26/2014   Procedure: DIAGNOSTIC LAPAROSCOPY;  Surgeon: Tilda BurrowJohn Ferguson V, MD;  Location: [facility];  Service: Gynecology;  Laterality: N/A;     OB History    Gravida  2   Para  2   Term  2   Preterm      AB      Living  2     SAB      IAB      Ectopic      Multiple      Live Births              Family History  Problem Relation Age of Onset  . Cancer Father        prostate  . Other Sister        IBS  . Other Brother        Lyme Disease  . Dementia Maternal Grandmother   . Parkinson's disease Maternal Grandfather   . Thrombocytopenia Sister     Social History   Tobacco Use  . Smoking status: Former Smoker    Packs/day: 0.50    Types: Cigarettes, E-cigarettes    Quit date: 02/28/2019    Years since quitting: 1.2  . Smokeless tobacco: Never Used  Vaping Use  .  Vaping Use: Former  Substance Use Topics  . Alcohol use: Yes    Comment: occ  . Drug use: No    Home Medications Prior to Admission medications   Medication Sig Start Date End Date Taking? Authorizing Provider  albuterol (VENTOLIN HFA) 108 (90 Base) MCG/ACT inhaler Inhale 2 puffs into the lungs every 4 (four) hours as needed. 04/19/19  Yes [provider]  ALPRAZolam Prudy Feeler) 0.5 MG tablet Take 0.5 mg by mouth daily as needed for anxiety.    Yes [provider]  amphetamine-dextroamphetamine (ADDERALL) 20 MG tablet Take 20 mg by mouth 2 (two) times daily.   Yes [provider]  APRI 0.15-30 MG-MCG tablet TAKE 1 TABLET BY MOUTH EVERY DAY 03/30/20  Yes Lazaro Arms, MD  fludrocortisone (FLORINEF) 0.1 MG tablet Take 1 tablet (0.1 mg total) by mouth 2 (two) times daily. 10/30/19  Yes Marinus Maw, MD  gabapentin (NEURONTIN) 600 MG tablet Take 600-1,800 mg by mouth See admin instructions.  Take 1 tablet twice daily then take 3 tablets at bedtime   Yes [provider]  levocetirizine (XYZAL) 5 MG tablet Take 5 mg by mouth daily.   Yes [provider]  ondansetron (ZOFRAN ODT) 4 MG disintegrating tablet Take 1 tablet (4 mg total) by mouth every 8 (eight) hours as needed for nausea or vomiting. 05/31/19  Yes Idol, Raynelle Fanning, PA-C  traMADol (ULTRAM) 50 MG tablet Take 50 mg by mouth every 6 (six) hours as needed for moderate pain. 05/13/20  Yes [provider]  vortioxetine HBr (TRINTELLIX) 10 MG TABS tablet Take 10 mg by mouth daily.   Yes [provider]  cyclobenzaprine (FLEXERIL) 10 MG tablet Take 1 tablet (10 mg total) by mouth 2 (two) times daily as needed for muscle spasms. Patient not taking: No sig reported 04/15/20   Moshe Cipro, NP  ibuprofen (ADVIL) 200 MG tablet Take 400 mg by mouth every 6 (six) hours as needed for mild pain or moderate pain.    [provider]  midodrine (PROAMATINE) 10 MG tablet Take 10 mg by mouth 3 (three) times daily.  Patient not taking: Reported on 05/17/2020 01/28/19   [provider]  polyethylene glycol powder (GLYCOLAX/MIRALAX) 17 GM/SCOOP powder Take 17 g by mouth daily. Patient not taking: Reported on 05/17/2020    [provider]    Allergies    Almond meal, Cymbalta [duloxetine hcl], Sulfa antibiotics, Trazodone and nefazodone, and Sudafed [pseudoephedrine hcl]  Review of Systems   Review of Systems  All other systems reviewed and are negative.   Physical Exam Updated Vital Signs BP (!) 152/83   Pulse 72   Temp 98.4 F (36.9 C) (Oral)   Resp 14   Ht 5\' 9"  (1.753 m)   Wt 77.1 kg   LMP 09/19/2014 Comment: post LAVH  SpO2 98%   BMI 25.10 kg/m   Physical Exam Vitals and nursing note reviewed.  Constitutional:      General: She is not in acute distress.    Appearance: She is well-developed. She is not ill-appearing, toxic-appearing or diaphoretic.  HENT:     Head:  Normocephalic and atraumatic.     Right Ear: External ear normal.     Left Ear: External ear normal.  Eyes:     Conjunctiva/sclera: Conjunctivae normal.     Pupils: Pupils are equal, round, and reactive to light.  Neck:     Trachea: Phonation normal.  Cardiovascular:     Rate and Rhythm:  Normal rate and regular rhythm.     Heart sounds: Normal heart sounds.  Pulmonary:     Effort: Pulmonary effort is normal.     Breath sounds: Normal breath sounds.  Abdominal:     Palpations: Abdomen is soft.     Tenderness: There is abdominal tenderness (Mild) in the epigastric area.  Musculoskeletal:        General: Normal range of motion.     Cervical back: Normal range of motion and neck supple.  Skin:    General: Skin is warm and dry.  Neurological:     General: No focal deficit present.     Mental Status: She is alert and oriented to person, place, and time.     Cranial Nerves: No cranial nerve deficit.     Sensory: No sensory deficit.     Motor: No abnormal muscle tone.     Coordination: Coordination normal.  Psychiatric:        Mood and Affect: Mood normal.        Behavior: Behavior normal.        Thought Content: Thought content normal.        Judgment: Judgment normal.     ED Results / Procedures / Treatments   Labs (all labs ordered are listed, but only abnormal results are displayed) Labs Reviewed  COMPREHENSIVE METABOLIC PANEL - Abnormal; Notable for the following components:      Result Value   Potassium 2.9 (*)    Calcium 8.4 (*)    All other components within normal limits  CBC - Abnormal; Notable for the following components:   RDW 11.4 (*)    All other components within normal limits  LIPASE, BLOOD  URINALYSIS, ROUTINE W REFLEX MICROSCOPIC    EKG None  Radiology No results found.  Procedures Procedures   Medications Ordered in ED Medications  ondansetron (ZOFRAN-ODT) disintegrating tablet 4 mg (4 mg Oral Given 05/17/20 1725)    ED Course  I have  reviewed the triage vital signs and the nursing notes.  Pertinent labs & imaging results that were available during my care of the patient were reviewed by me and considered in my medical decision making (see chart for details).    MDM Rules/Calculators/A&P                           Patient Vitals for the past 24 hrs:  BP Temp Temp src Pulse Resp SpO2 Height Weight  05/17/20 2200 (!) 152/83 -- -- 72 14 98 % -- --  05/17/20 2130 (!) 150/89 -- -- 62 16 98 % -- --  05/17/20 2045 (!) 166/93 -- -- 81 13 98 % -- --  05/17/20 1721 -- -- -- -- -- -- 5\' 9"  (1.753 m) 77.1 kg  05/17/20 1720 (!) 149/95 98.4 F (36.9 C) Oral 89 16 98 % -- --    At time of discharge- reevaluation with update and discussion. After initial assessment and treatment, an updated evaluation reveals no further complaints, findings discussed questions answered. 05/19/20   Medical Decision Making:  This patient is presenting for evaluation of upper abdominal pain, which does require a range of treatment options, and is a complaint that involves a moderate risk of morbidity and mortality. The differential diagnoses include gastritis, gallbladder disease, nonspecific intra-abdominal process. I decided to review old records, and in summary healthy young adult, presenting for ongoing pain, that comes and goes and is sharp in nature.  I did not additional historical information from anyone.  Clinical Laboratory Tests Ordered, included CBC, Metabolic panel, Urinalysis and Lipase. Review indicates normal except potassium.      Critical Interventions-clinical evaluation, laboratory testing, medication treatment, observation reassessment  After These Interventions, the Patient was reevaluated and was found stable for discharge.  Nonspecific pain, possible gallbladder disease although higher likelihood of gastritis or reflux.  Patient is nontoxic.  She is given a prescription for potassium, and will follow up with her PCP for  further evaluation treatment.  CRITICAL CARE-no Performed by: Mancel Bale  Nursing Notes Reviewed/ Care Coordinated Applicable Imaging Reviewed Interpretation of Laboratory Data incorporated into ED treatment  The patient appears reasonably screened and/or stabilized for discharge and I doubt any other medical condition or other [facility] requiring further screening, evaluation, or treatment in the ED at this time prior to discharge.  Plan: Home Medications-antacids before meals and at bedtime continue usual; Home Treatments-high potassium diet; return here if the recommended treatment, does not improve the symptoms; Recommended follow up-PCP, prn      Final Clinical Impression(s) / ED Diagnoses Final diagnoses:  Epigastric pain  Hypokalemia    Rx / DC Orders ED Discharge Orders    None       Mancel Bale, MD 05/19/20 [phone]

## 2020-05-23 ENCOUNTER — Other Ambulatory Visit: Payer: Self-pay | Admitting: Neurosurgery

## 2020-05-24 ENCOUNTER — Other Ambulatory Visit: Payer: Self-pay | Admitting: Neurosurgery

## 2020-05-24 DIAGNOSIS — M4714 Other spondylosis with myelopathy, thoracic region: Secondary | ICD-10-CM

## 2020-05-24 DIAGNOSIS — M5416 Radiculopathy, lumbar region: Secondary | ICD-10-CM

## 2020-06-09 ENCOUNTER — Ambulatory Visit
Admission: RE | Admit: 2020-06-09 | Discharge: 2020-06-09 | Disposition: A | Discharge status: Home / Self Care | Payer: 59 | Source: Ambulatory Visit | Attending: Neurosurgery | Admitting: Neurosurgery

## 2020-06-09 ENCOUNTER — Other Ambulatory Visit: Payer: Self-pay

## 2020-06-09 ENCOUNTER — Other Ambulatory Visit: Payer: 59

## 2020-06-09 DIAGNOSIS — M4714 Other spondylosis with myelopathy, thoracic region: Secondary | ICD-10-CM

## 2020-06-09 DIAGNOSIS — M5416 Radiculopathy, lumbar region: Secondary | ICD-10-CM

## 2020-06-09 IMAGING — MR MR LUMBAR SPINE W/O CM
4 of 5 series · 27 of 48 positions shown · non-contrast
Comparison: MRI lumbar spine [DATE]

CLINICAL DATA: Mid and low back pain after injury 5 years ago

EXAM:
MRI THORACIC AND LUMBAR SPINE WITHOUT CONTRAST
TECHNIQUE: Multiplanar and multiecho pulse sequences of the thoracic and lumbar
spine were obtained without intravenous contrast.

[Series 2: T2 · sagittal · 4.0mm · 1.09mm/px · 6 of 17 slices shown (1 of 2)]
[im 1/17]
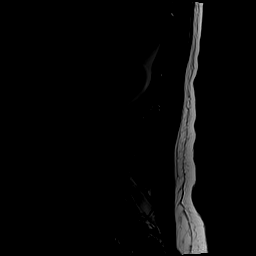
[im 4/17]
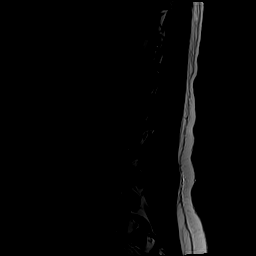
[im 7/17]
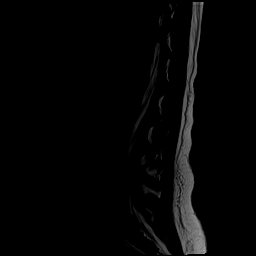
[im 10/17]
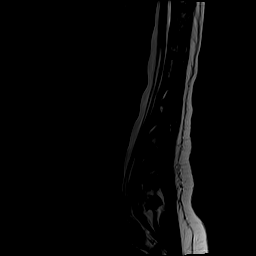
[im 13/17]
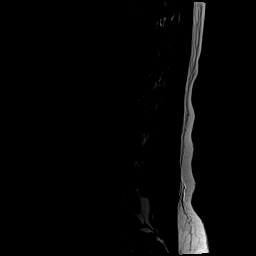
[im 17/17]
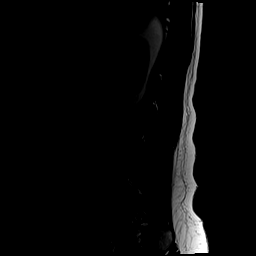

[Series 4: T1 · sagittal · 4.0mm · 1.09mm/px · 6 of 17 slices shown (1 of 2)]
[im 1/17]
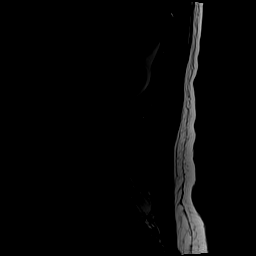
[im 4/17]
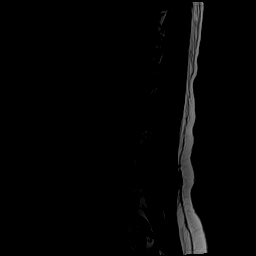
[im 7/17]
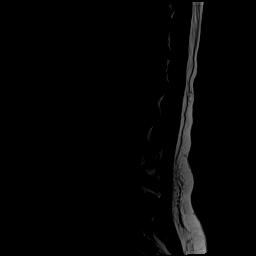
[im 10/17]
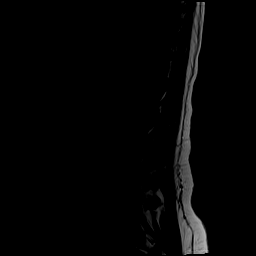
[im 13/17]
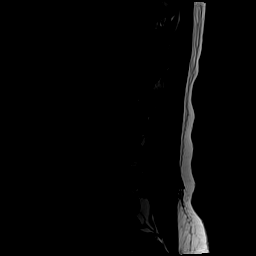
[im 17/17]
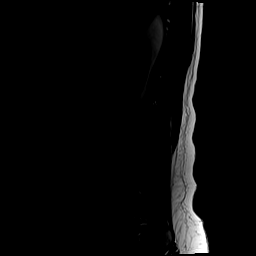

[Series 5: T2 · axial · 4.0mm · 0.39mm/px · z∈[-515,-302]mm · 9 of 41 slices shown (2 of 2)]
[im 1/41]
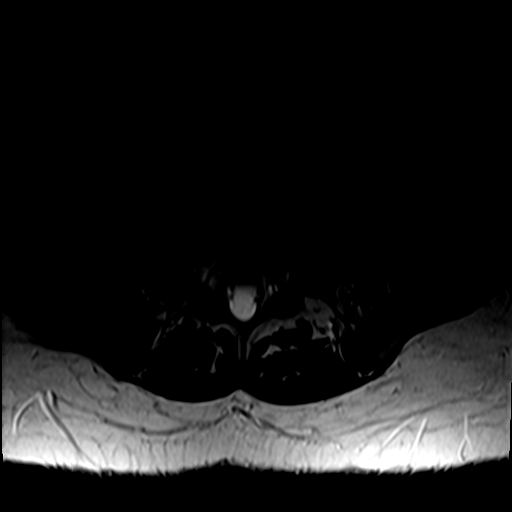
[im 6/41]
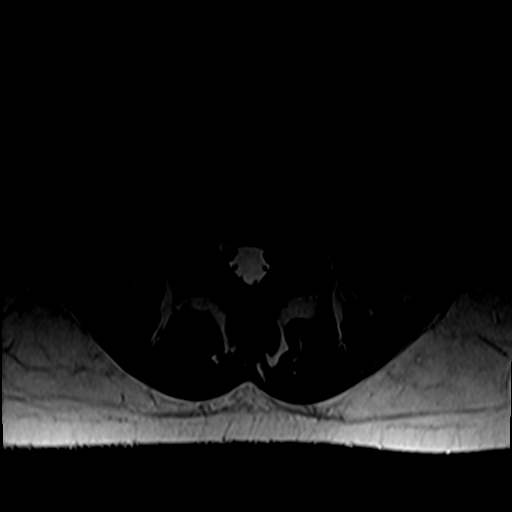
[im 12/41]
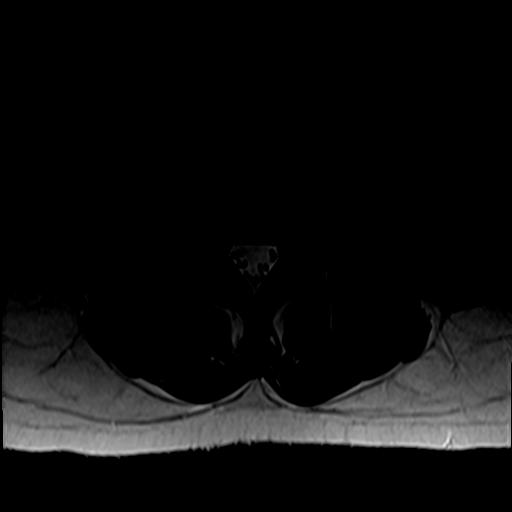
[im 18/41]
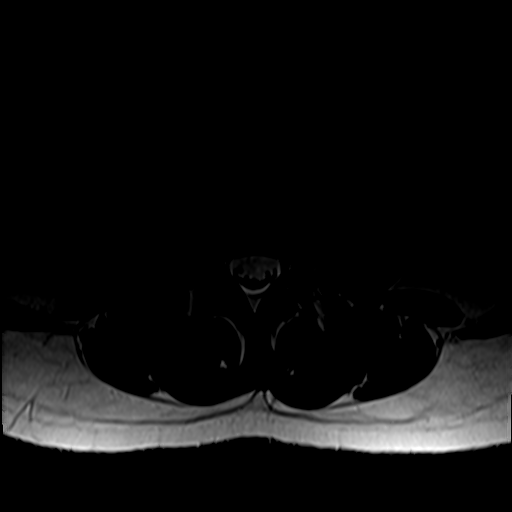
[im 21/41]
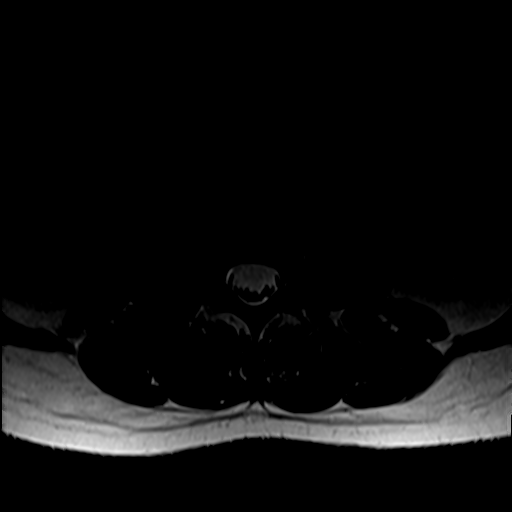
[im 23/41]
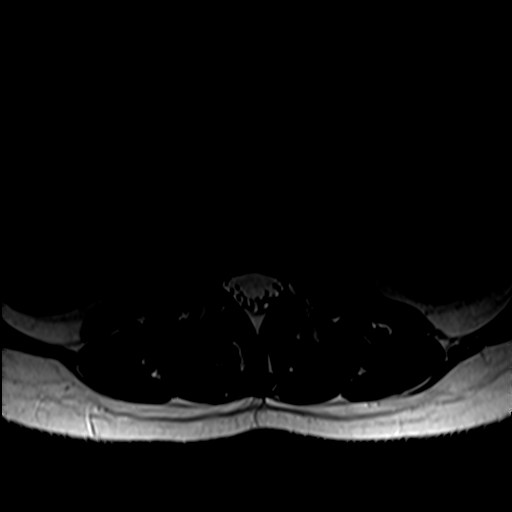
[im 29/41]
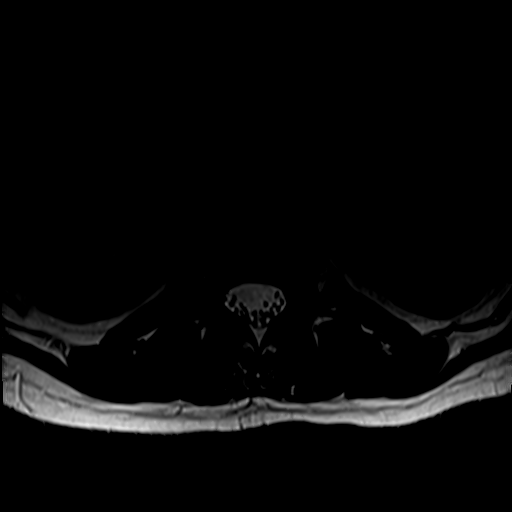
[im 35/41]
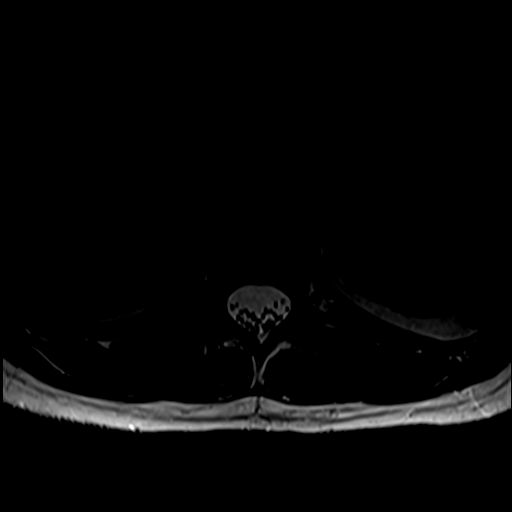
[im 41/41]
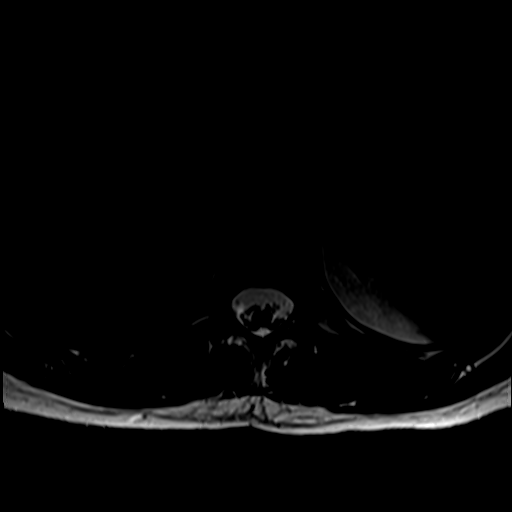

[Series 6: T1 · axial · 4.0mm · 0.39mm/px · z∈[-515,-331]mm · 6 of 41 slices shown (2 of 2)]
[im 1/41]
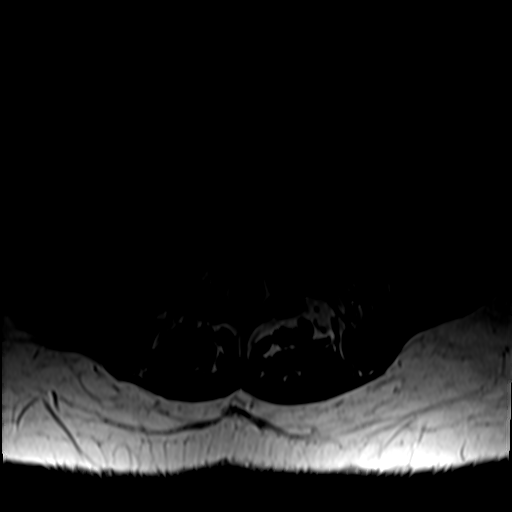
[im 6/41]
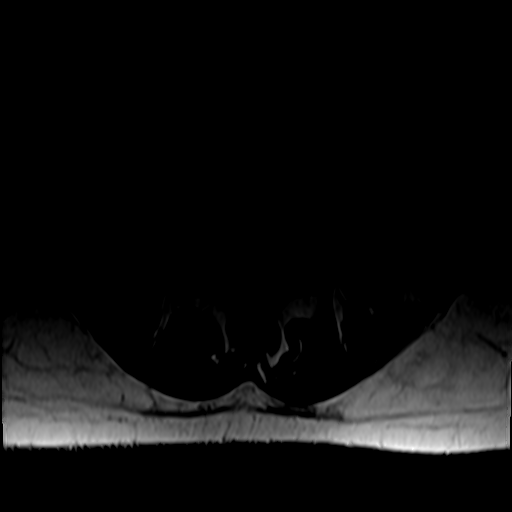
[im 12/41]
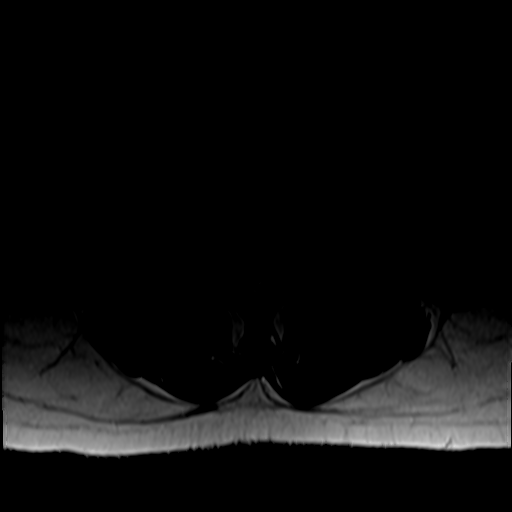
[im 18/41]
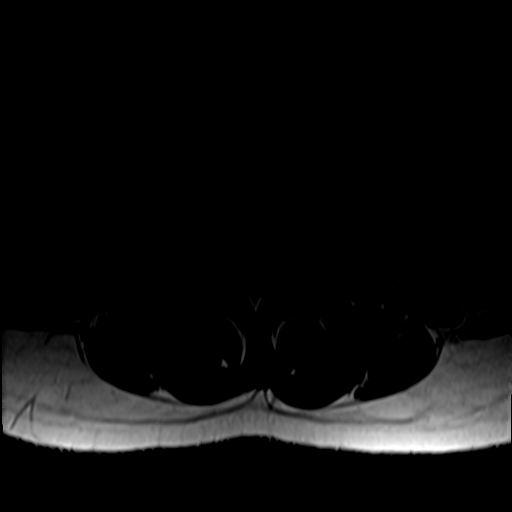
[im 21/41]
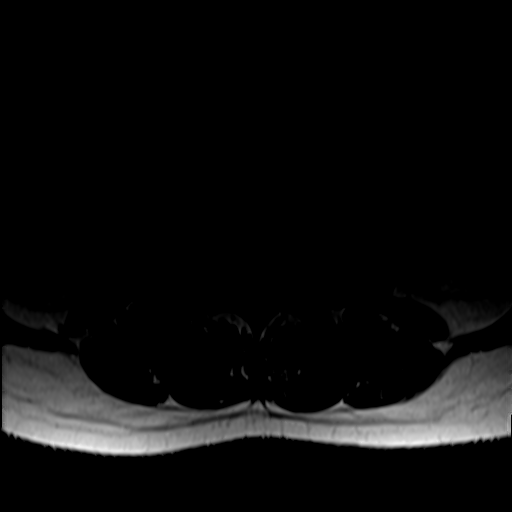
[im 35/41]
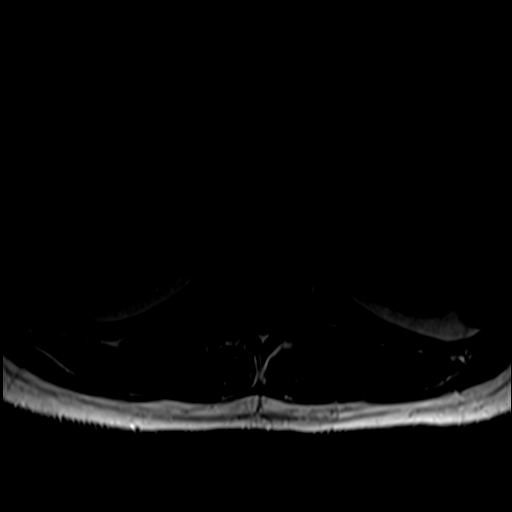

[27 of 48 positions shown; findings below may reference images not displayed]

FINDINGS: MRI THORACIC SPINE FINDINGS

Alignment:  Physiologic.

Vertebrae: No fracture, evidence of discitis, or bone lesion.

Cord:  Normal signal and morphology.

Paraspinal and other soft tissues: Negative.

Disc levels:

Intervertebral disc heights of the thoracic spine are maintained. No
disc protrusion. No foraminal or canal stenosis at any level.

MRI LUMBAR SPINE FINDINGS

Segmentation:  Standard.

Alignment:  Physiologic.

Vertebrae:  No fracture, evidence of discitis, or bone lesion.

Conus medullaris and cauda equina: Conus extends to the T12-L1
level. Conus and cauda equina appear normal.

Paraspinal and other soft tissues: Negative.

Disc levels:

T12-L1 through L4-L5: Negative.

L5-S1: Disc desiccation with left subarticular/foraminal protrusion
with annular fissure resulting in moderate left subarticular recess
stenosis, likely affecting the descending left S1 nerve root (series
5, image 37). Similar in size and appearance compared to the
previous study. Unremarkable facet joints. No foraminal stenosis. No
canal stenosis.
IMPRESSION: 1. No significant change in size or appearance of a left
subarticular/foraminal protrusion at L5-S1 likely impinging the
descending left S1 nerve root.
2. Remaining lumbar disc levels are unremarkable.
3. Normal MRI of the thoracic spine.
4. No foraminal or canal stenosis at any level.

## 2020-06-09 IMAGING — MR MR THORACIC SPINE W/O CM
4 of 6 series · 18 of 48 positions shown · non-contrast
Comparison: MRI lumbar spine [DATE]

CLINICAL DATA: Mid and low back pain after injury 5 years ago

EXAM:
MRI THORACIC AND LUMBAR SPINE WITHOUT CONTRAST
TECHNIQUE: Multiplanar and multiecho pulse sequences of the thoracic and lumbar
spine were obtained without intravenous contrast.

[Series 4: T2 · sagittal · 4.0mm · 0.55mm/px · 6 of 17 slices shown (1 of 3)]
[im 1/17]
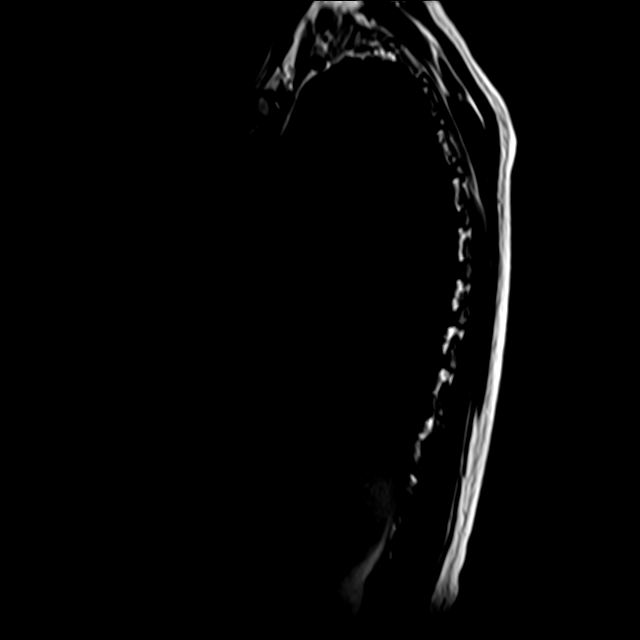
[im 4/17]
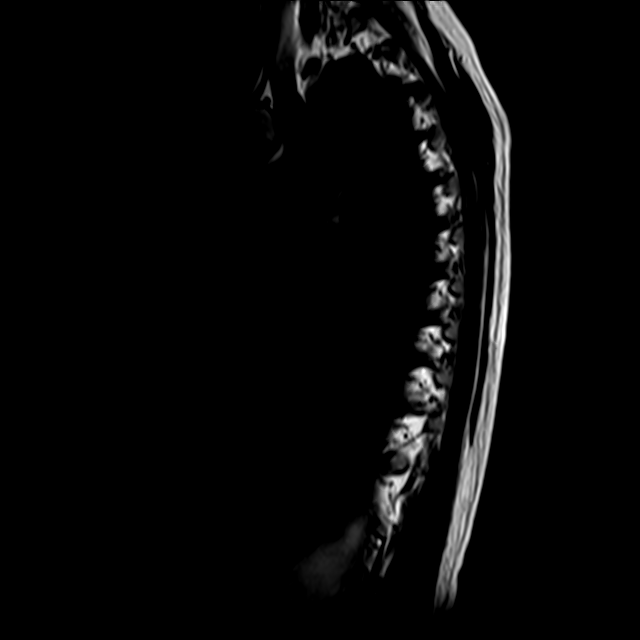
[im 7/17]
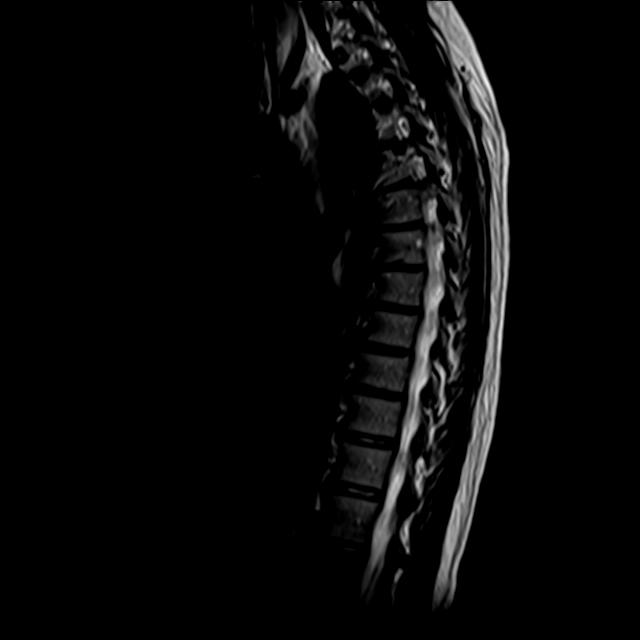
[im 10/17]
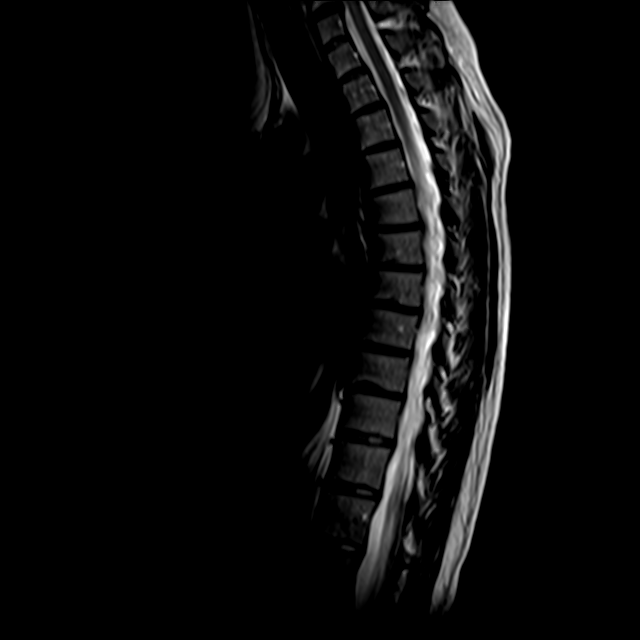
[im 13/17]
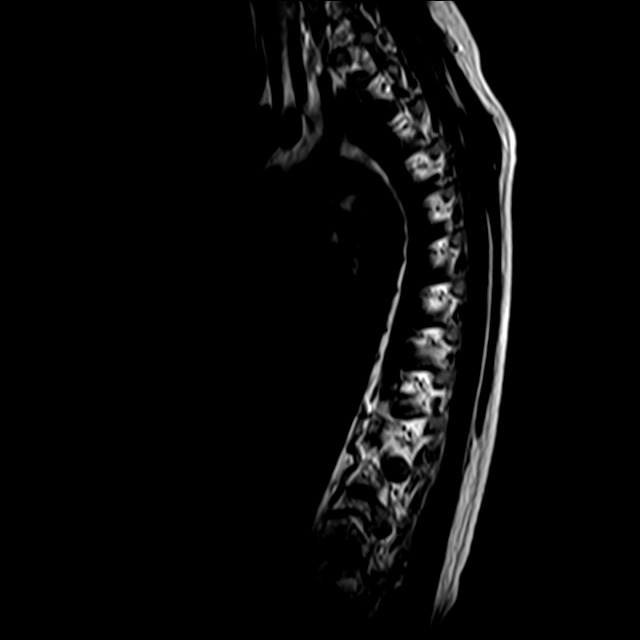
[im 17/17]
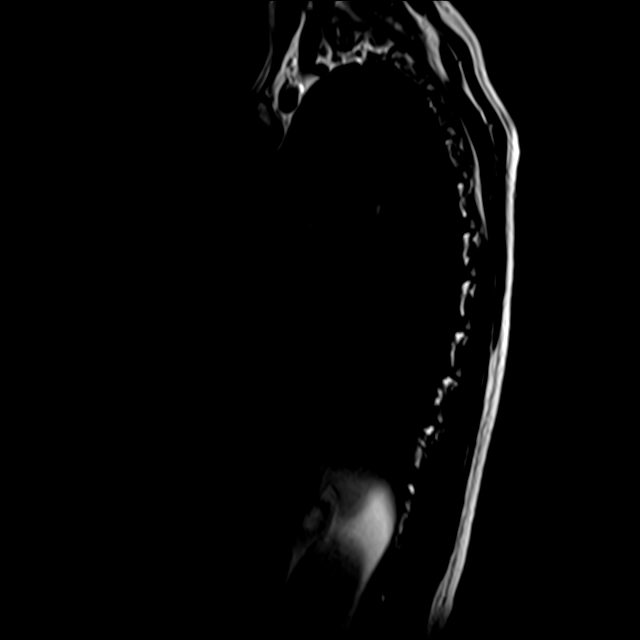

[Series 6: T1 · sagittal · 4.0mm · 1.09mm/px · 3 of 17 slices shown]
[im 4/17]
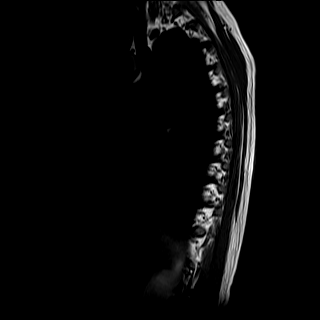
[im 10/17]
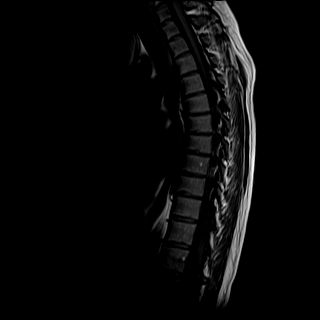
[im 17/17]
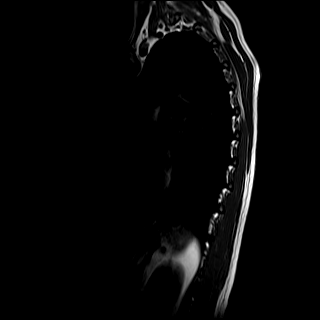

[Series 7: T2 · axial · 4.0mm · 0.39mm/px · z∈[-314,-120]mm · 6 of 36 slices shown (2 of 3)]
[im 1/36]
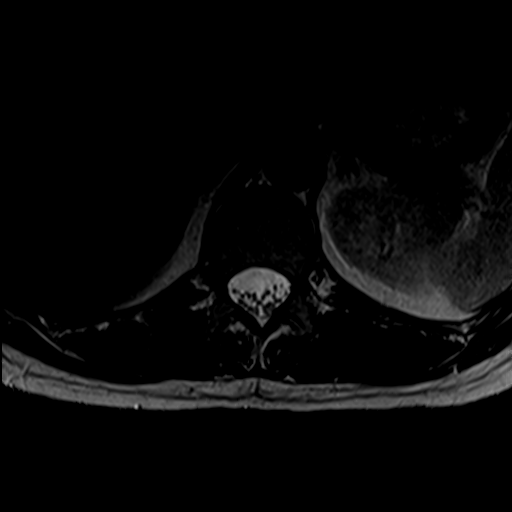
[im 7/36]
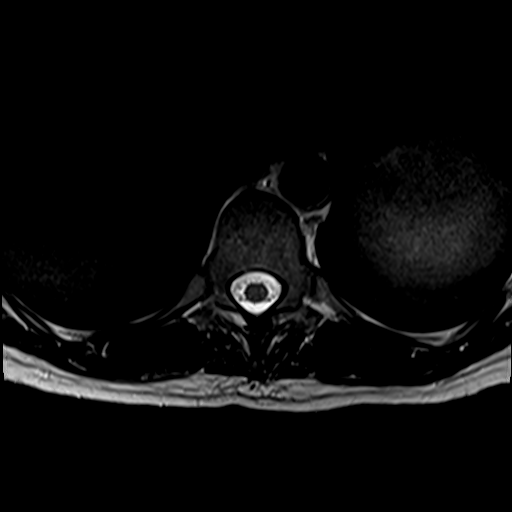
[im 10/36]
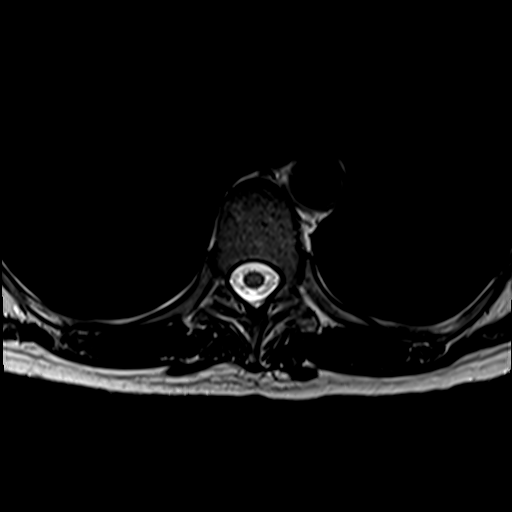
[im 16/36]
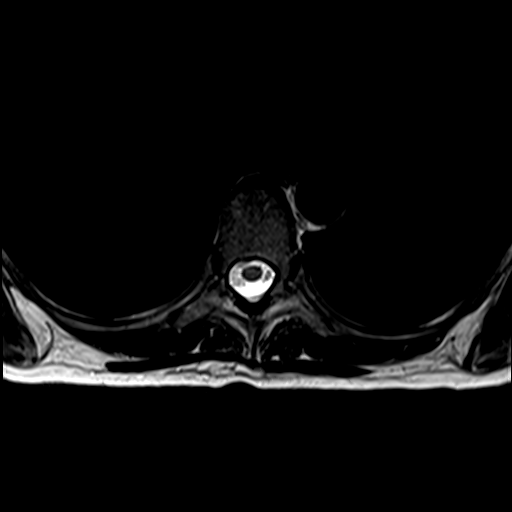
[im 20/36]
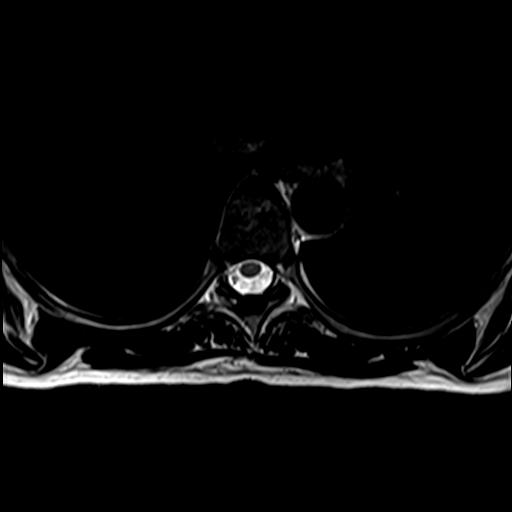
[im 32/36]
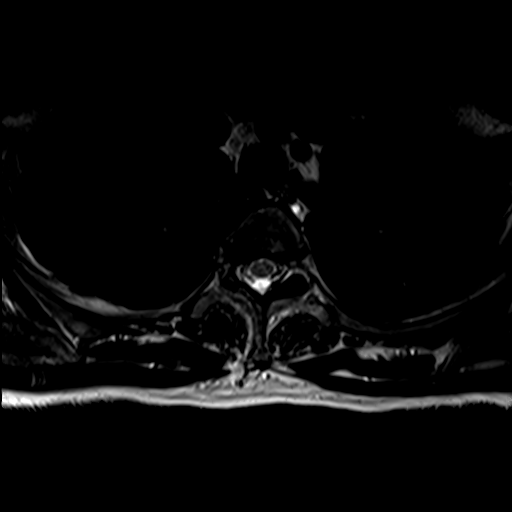

[Series 8: T2 · axial · 4.0mm · 0.39mm/px · z∈[-260,-120]mm · 3 of 36 slices shown (3 of 3)]
[im 7/36]
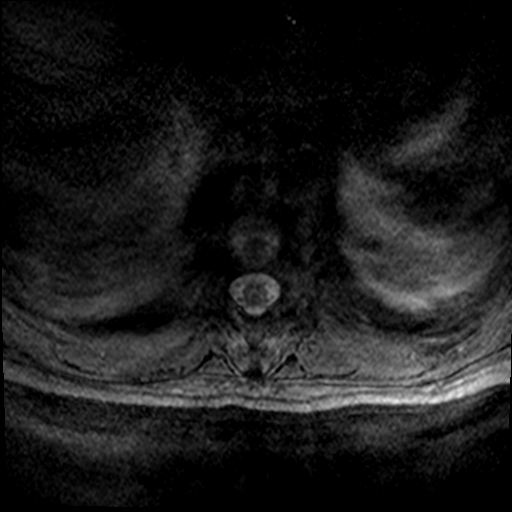
[im 20/36]
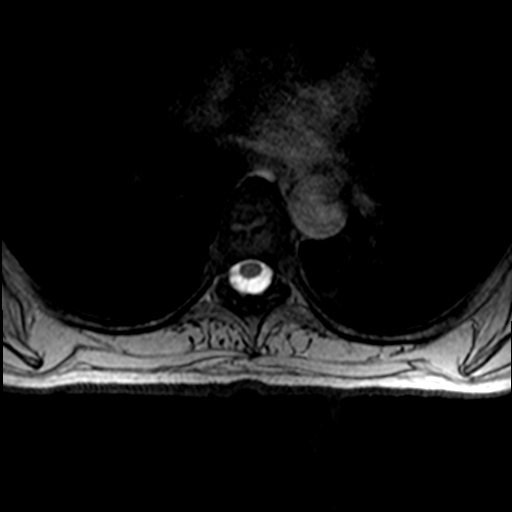
[im 32/36]
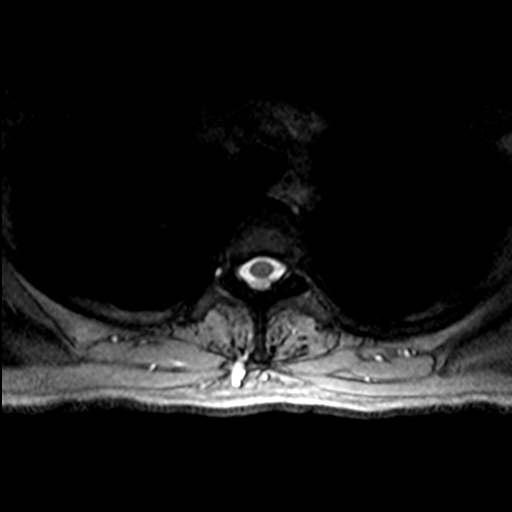

[18 of 48 positions shown; findings below may reference images not displayed]

FINDINGS: MRI THORACIC SPINE FINDINGS

Alignment:  Physiologic.

Vertebrae: No fracture, evidence of discitis, or bone lesion.

Cord:  Normal signal and morphology.

Paraspinal and other soft tissues: Negative.

Disc levels:

Intervertebral disc heights of the thoracic spine are maintained. No
disc protrusion. No foraminal or canal stenosis at any level.

MRI LUMBAR SPINE FINDINGS

Segmentation:  Standard.

Alignment:  Physiologic.

Vertebrae:  No fracture, evidence of discitis, or bone lesion.

Conus medullaris and cauda equina: Conus extends to the T12-L1
level. Conus and cauda equina appear normal.

Paraspinal and other soft tissues: Negative.

Disc levels:

T12-L1 through L4-L5: Negative.

L5-S1: Disc desiccation with left subarticular/foraminal protrusion
with annular fissure resulting in moderate left subarticular recess
stenosis, likely affecting the descending left S1 nerve root (series
5, image 37). Similar in size and appearance compared to the
previous study. Unremarkable facet joints. No foraminal stenosis. No
canal stenosis.
IMPRESSION: 1. No significant change in size or appearance of a left
subarticular/foraminal protrusion at L5-S1 likely impinging the
descending left S1 nerve root.
2. Remaining lumbar disc levels are unremarkable.
3. Normal MRI of the thoracic spine.
4. No foraminal or canal stenosis at any level.

## 2020-07-01 HISTORY — PX: MICRODISCECTOMY LUMBAR: SUR864

## 2020-07-09 ENCOUNTER — Observation Stay (HOSPITAL_COMMUNITY)
Admission: EM | Admit: 2020-07-09 | Discharge: 2020-07-11 | Disposition: A | Discharge status: Home / Self Care | Payer: 59 | Attending: Emergency Medicine | Admitting: Emergency Medicine

## 2020-07-09 ENCOUNTER — Other Ambulatory Visit: Payer: Self-pay

## 2020-07-09 ENCOUNTER — Emergency Department (HOSPITAL_COMMUNITY): Payer: 59

## 2020-07-09 ENCOUNTER — Encounter (HOSPITAL_COMMUNITY): Payer: Self-pay

## 2020-07-09 DIAGNOSIS — Z20822 Contact with and (suspected) exposure to covid-19: Secondary | ICD-10-CM | POA: Diagnosis not present

## 2020-07-09 DIAGNOSIS — Z87891 Personal history of nicotine dependence: Secondary | ICD-10-CM | POA: Insufficient documentation

## 2020-07-09 DIAGNOSIS — X58XXXD Exposure to other specified factors, subsequent encounter: Secondary | ICD-10-CM | POA: Diagnosis not present

## 2020-07-09 DIAGNOSIS — R000 Tachycardia, unspecified: Secondary | ICD-10-CM | POA: Insufficient documentation

## 2020-07-09 DIAGNOSIS — T8189XD Other complications of procedures, not elsewhere classified, subsequent encounter: Secondary | ICD-10-CM | POA: Insufficient documentation

## 2020-07-09 DIAGNOSIS — R519 Headache, unspecified: Secondary | ICD-10-CM | POA: Diagnosis not present

## 2020-07-09 LAB — CBC WITH DIFFERENTIAL/PLATELET
Abs Immature Granulocytes: 0.02 10*3/uL (ref 0.00–0.07)
Basophils Absolute: 0 10*3/uL (ref 0.0–0.1)
Basophils Relative: 0 %
Eosinophils Absolute: 0.1 10*3/uL (ref 0.0–0.5)
Eosinophils Relative: 1 %
HCT: 36.5 % (ref 36.0–46.0)
Hemoglobin: 12.9 g/dL (ref 12.0–15.0)
Immature Granulocytes: 0 %
Lymphocytes Relative: 20 %
Lymphs Abs: 1.7 10*3/uL (ref 0.7–4.0)
MCH: 32.9 pg (ref 26.0–34.0)
MCHC: 35.3 g/dL (ref 30.0–36.0)
MCV: 93.1 fL (ref 80.0–100.0)
Monocytes Absolute: 0.5 10*3/uL (ref 0.1–1.0)
Monocytes Relative: 6 %
Neutro Abs: 6.2 10*3/uL (ref 1.7–7.7)
Neutrophils Relative %: 73 %
Platelets: 278 10*3/uL (ref 150–400)
RBC: 3.92 MIL/uL (ref 3.87–5.11)
RDW: 11.3 % — ABNORMAL LOW (ref 11.5–15.5)
WBC: 8.4 10*3/uL (ref 4.0–10.5)
nRBC: 0 % (ref 0.0–0.2)

## 2020-07-09 LAB — BASIC METABOLIC PANEL
Anion gap: 9 (ref 5–15)
BUN: 11 mg/dL (ref 6–20)
CO2: 24 mmol/L (ref 22–32)
Calcium: 8.5 mg/dL — ABNORMAL LOW (ref 8.9–10.3)
Chloride: 105 mmol/L (ref 98–111)
Creatinine, Ser: 0.69 mg/dL (ref 0.44–1.00)
GFR, Estimated: >60 mL/min (ref >60)
Glucose, Bld: 100 mg/dL — ABNORMAL HIGH (ref 70–99)
Potassium: 3.2 mmol/L — ABNORMAL LOW (ref 3.5–5.1)
Sodium: 138 mmol/L (ref 135–145)

## 2020-07-09 LAB — LACTIC ACID, PLASMA
Lactic Acid, Venous: 1.5 mmol/L (ref 0.5–1.9)
Lactic Acid, Venous: 1 mmol/L (ref 0.5–1.9)

## 2020-07-09 LAB — URINALYSIS, ROUTINE W REFLEX MICROSCOPIC
Bilirubin Urine: NEGATIVE
Glucose, UA: NEGATIVE mg/dL
Ketones, ur: NEGATIVE mg/dL
Leukocytes,Ua: NEGATIVE
Nitrite: NEGATIVE
Protein, ur: NEGATIVE mg/dL
Specific Gravity, Urine: 1.017 (ref 1.005–1.030)
pH: 6 (ref 5.0–8.0)

## 2020-07-09 LAB — SEDIMENTATION RATE: Sed Rate: 32 mm/hr — ABNORMAL HIGH (ref 0–22)

## 2020-07-09 LAB — RESP PANEL BY RT-PCR (FLU A&B, COVID) ARPGX2
Influenza A by PCR: NEGATIVE
Influenza B by PCR: NEGATIVE
SARS Coronavirus 2 by RT PCR: NEGATIVE

## 2020-07-09 LAB — C-REACTIVE PROTEIN: CRP: 1.3 mg/dL — ABNORMAL HIGH (ref <1.0)

## 2020-07-09 IMAGING — DX DG CHEST 1V PORT
1 series · 1 of 1 positions shown · non-contrast
Comparison: None.

CLINICAL DATA: Fever

EXAM:
PORTABLE CHEST 1 VIEW

[chest ap]
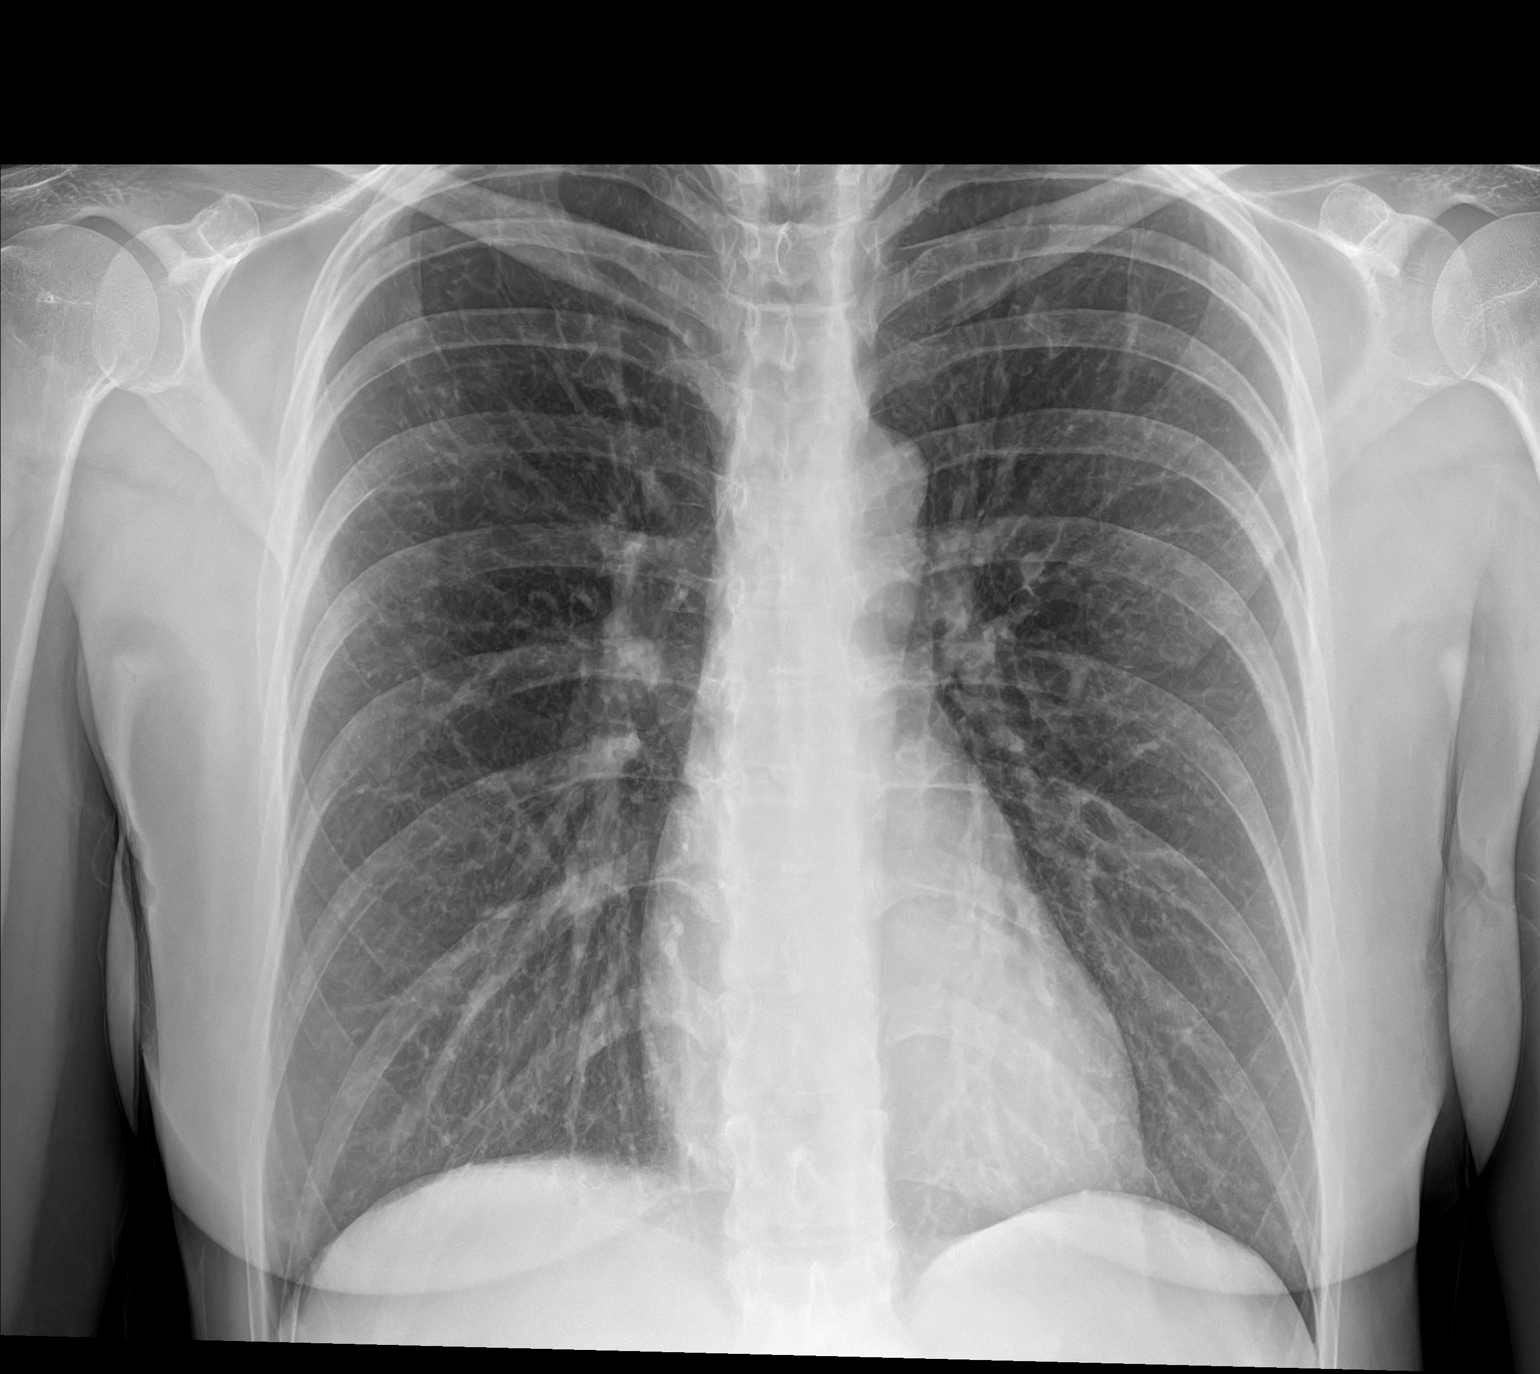

[1 of 1 positions shown; findings below may reference images not displayed]

FINDINGS: Lungs are clear. Heart size and pulmonary vascularity are normal. No
adenopathy. No pneumothorax. No bone lesions.
IMPRESSION: Lungs clear.  Cardiac silhouette normal.

## 2020-07-09 MED ORDER — POTASSIUM CHLORIDE CRYS ER 20 MEQ PO TBCR
40.0000 meq | EXTENDED_RELEASE_TABLET | Freq: Once | ORAL | Status: AC
  Administered 2020-07-09: 40 meq via ORAL
  Filled 2020-07-09: qty 2

## 2020-07-09 MED ORDER — ACETAMINOPHEN 500 MG PO TABS
1000.0000 mg | ORAL_TABLET | Freq: Once | ORAL | Status: AC
  Administered 2020-07-09: 1000 mg via ORAL
  Filled 2020-07-09: qty 2

## 2020-07-09 MED ORDER — SODIUM CHLORIDE 0.9 % IV BOLUS
1000.0000 mL | Freq: Once | INTRAVENOUS | Status: AC
  Administered 2020-07-09: 1000 mL via INTRAVENOUS

## 2020-07-09 NOTE — ED Triage Notes (Signed)
Pt arrived from home via POV c/o of severe headache following a microdiscectomy on Friday May 6th. Pt reports serosanguinous fluid draining from surgical site and would like evaluation.

## 2020-07-09 NOTE — ED Notes (Signed)
Report given to [facility] with [facility].

## 2020-07-09 NOTE — ED Provider Notes (Signed)
[facility] Provider Note   CSN: [id number] Arrival date & time: 07/09/20  1929     History Chief Complaint  Patient presents with  . severe headache    Rachel Griffith is a 35 y.o. female.  HPI   35 year old female with a history of anxiety, back injury, chronic pelvic pain, endometriosis, ovarian cyst, POTS, PTSD, who presents the emergency department today for evaluation of headache and wound check.  Patient had microdiscectomy on 07/01/2020, she has been doing well up until today when she started to have serosanguineous drainage from the wound.  She states that she is change the dressing to her wound several times throughout the day and has had to change her pants twice because of how much drainage she is having from the wound.  She further reports that she called the on-call neurosurgery team several times today and was given an antibiotic which she states she started today.  However, after speaking with neurosurgery she developed a severe headache so came to the ED for further evaluation.  States she is never had a similar headache like this before.  She denies any visual changes, unilateral numbness/weakness.  Past Medical History:  Diagnosis Date  . Anxiety   . Back injury   . Chronic pelvic pain in female   . Endometriosis   . Endometriosis determined by laparoscopy 10/06/2014  . Ovarian cyst   . Pelvic pain in female 10/06/2014  . PONV (postoperative nausea and vomiting)    pt states that with 1st arthroscopy she "passed out and couldnt left her head" but they tell me it was a combination of the pain meds and anesthesia" No problems with next surgery. Not sure of meds given in anesthesia.  Marland Kitchen. POTS (postural orthostatic tachycardia syndrome)    pt reports this was diagnosed a couple of months ago by PCP (Dr Annia FriendlyBeard)  . PTSD (post-traumatic stress disorder)     Patient Active Problem List   Diagnosis Date Noted  . Left ovarian cyst 07/31/2018  . Ovarian  cyst, bilateral 02/08/2016  . Postoperative abdominal pain 11/24/2014  . Status post laparoscopic assisted vaginal hysterectomy (LAVH) 11/23/2014  . Pelvic pain in female 10/06/2014  . Endometriosis determined by laparoscopy 10/06/2014    Past Surgical History:  Procedure Laterality Date  . ABDOMINAL HYSTERECTOMY    . endometrosis  2014   [facility]  . KNEE SURGERY Bilateral    x2  . LAPAROSCOPIC BILATERAL SALPINGO OOPHERECTOMY Left 07/31/2018   Procedure: LAPAROSCOPIC LEFT SALPINGO OOPHORECTOMY;  Surgeon: Lazaro ArmsEure, Luther H, MD;  Location: [facility];  Service: Gynecology;  Laterality: Left;  . LAPAROSCOPY N/A 11/26/2014   Procedure: DIAGNOSTIC LAPAROSCOPY;  Surgeon: Tilda BurrowJohn Ferguson V, MD;  Location: [facility];  Service: Gynecology;  Laterality: N/A;  . MICRODISCECTOMY LUMBAR  07/01/2020     OB History    Gravida  2   Para  2   Term  2   Preterm      AB      Living  2     SAB      IAB      Ectopic      Multiple      Live Births              Family History  Problem Relation Age of Onset  . Cancer Father        prostate  . Other Sister        IBS  . Other Brother  Lyme Disease  . Dementia Maternal Grandmother   . Parkinson's disease Maternal Grandfather   . Thrombocytopenia Sister     Social History   Tobacco Use  . Smoking status: Former Smoker    Packs/day: 0.50    Types: Cigarettes, E-cigarettes    Quit date: 02/28/2019    Years since quitting: 1.3  . Smokeless tobacco: Never Used  Vaping Use  . Vaping Use: Former  Substance Use Topics  . Alcohol use: Yes    Comment: occ  . Drug use: No    Home Medications Prior to Admission medications   Medication Sig Start Date End Date Taking? Authorizing Provider  albuterol (VENTOLIN HFA) 108 (90 Base) MCG/ACT inhaler Inhale 2 puffs into the lungs every 4 (four) hours as needed. 04/19/19   [provider]  ALPRAZolam Prudy Feeler) 0.5 MG tablet Take 0.5 mg by mouth daily as needed for anxiety.      [provider]  amphetamine-dextroamphetamine (ADDERALL) 20 MG tablet Take 20 mg by mouth 2 (two) times daily.    [provider]  APRI 0.15-30 MG-MCG tablet TAKE 1 TABLET BY MOUTH EVERY DAY 03/30/20   Lazaro Arms, MD  cyclobenzaprine (FLEXERIL) 10 MG tablet Take 1 tablet (10 mg total) by mouth 2 (two) times daily as needed for muscle spasms. Patient not taking: No sig reported 04/15/20   Moshe Cipro, NP  fludrocortisone (FLORINEF) 0.1 MG tablet Take 1 tablet (0.1 mg total) by mouth 2 (two) times daily. 10/30/19   Marinus Maw, MD  gabapentin (NEURONTIN) 600 MG tablet Take 600-1,800 mg by mouth See admin instructions. Take 1 tablet twice daily then take 3 tablets at bedtime    [provider]  ibuprofen (ADVIL) 200 MG tablet Take 400 mg by mouth every 6 (six) hours as needed for mild pain or moderate pain.    [provider]  levocetirizine (XYZAL) 5 MG tablet Take 5 mg by mouth daily.    [provider]  midodrine (PROAMATINE) 10 MG tablet Take 10 mg by mouth 3 (three) times daily.  Patient not taking: Reported on 05/17/2020 01/28/19   [provider]  ondansetron (ZOFRAN ODT) 4 MG disintegrating tablet Take 1 tablet (4 mg total) by mouth every 8 (eight) hours as needed for nausea or vomiting. 05/31/19   Burgess Amor, PA-C  polyethylene glycol powder (GLYCOLAX/MIRALAX) 17 GM/SCOOP powder Take 17 g by mouth daily. Patient not taking: Reported on 05/17/2020    [provider]  potassium chloride 20 MEQ TBCR Take 10 mEq by mouth 2 (two) times daily. 05/17/20   Mancel Bale, MD  traMADol (ULTRAM) 50 MG tablet Take 50 mg by mouth every 6 (six) hours as needed for moderate pain. 05/13/20   [provider]  vortioxetine HBr (TRINTELLIX) 10 MG TABS tablet Take 10 mg by mouth daily.    [provider]    Allergies    Almond meal, Cymbalta [duloxetine hcl], Sulfa antibiotics, Trazodone and nefazodone, and Sudafed  [pseudoephedrine hcl]  Review of Systems   Review of Systems  Constitutional: Negative for chills and fever.  HENT: Negative for ear pain and sore throat.   Eyes: Negative for visual disturbance.  Respiratory: Negative for cough and shortness of breath.   Cardiovascular: Negative for chest pain.  Gastrointestinal: Negative for abdominal pain, constipation, diarrhea, nausea and vomiting.  Genitourinary: Negative for dysuria and hematuria.  Musculoskeletal: Negative for back pain.  Skin: Negative for color change and rash.  Neurological: Positive for  headaches. Negative for weakness and numbness.  All other systems reviewed and are negative.   Physical Exam Updated Vital Signs BP (!) 143/92   Pulse 88   Temp 100.1 F (37.8 C) (Oral)   Resp 15   Ht 5\' 9"  (1.753 m)   Wt 72.6 kg   LMP 09/19/2014 Comment: post LAVH  SpO2 100%   BMI 23.63 kg/m   Physical Exam Vitals and nursing note reviewed.  Constitutional:      General: She is not in acute distress.    Appearance: She is well-developed.  HENT:     Head: Normocephalic and atraumatic.  Eyes:     Conjunctiva/sclera: Conjunctivae normal.  Cardiovascular:     Rate and Rhythm: Regular rhythm. Tachycardia present.     Heart sounds: Normal heart sounds. No murmur heard.   Pulmonary:     Effort: Pulmonary effort is normal. No respiratory distress.     Breath sounds: Normal breath sounds. No wheezing, rhonchi or rales.  Abdominal:     General: Bowel sounds are normal.     Palpations: Abdomen is soft.     Tenderness: There is no abdominal tenderness. There is no guarding or rebound.  Musculoskeletal:     Cervical back: Neck supple.  Skin:    General: Skin is warm and dry.  Neurological:     Mental Status: She is alert.     Comments: Mental Status:  Alert, thought content appropriate, able to give a coherent history. Speech fluent without evidence of aphasia. Able to follow 2 step commands without difficulty.  Cranial  Nerves:  II:  pupils equal, round, reactive to light III,IV, VI: ptosis not present, extra-ocular motions intact bilaterally  V,VII: smile symmetric, facial light touch sensation equal VIII: hearing grossly normal to voice  X: uvula elevates symmetrically  XI: bilateral shoulder shrug symmetric and strong XII: midline tongue extension without fassiculations Motor:  Normal tone. 5/5 strength of BUE and BLE major muscle groups including strong and equal grip strength and dorsiflexion/plantar flexion Sensory: light touch normal in all extremities.      ED Results / Procedures / Treatments   Labs (all labs ordered are listed, but only abnormal results are displayed) Labs Reviewed  CBC WITH DIFFERENTIAL/PLATELET - Abnormal; Notable for the following components:      Result Value   RDW 11.3 (*)    All other components within normal limits  URINALYSIS, ROUTINE W REFLEX MICROSCOPIC - Abnormal; Notable for the following components:   Hgb urine dipstick SMALL (*)    Bacteria, UA RARE (*)    All other components within normal limits  CULTURE, BLOOD (ROUTINE X 2)  CULTURE, BLOOD (ROUTINE X 2)  RESP PANEL BY RT-PCR (FLU A&B, COVID) ARPGX2  LACTIC ACID, PLASMA  BASIC METABOLIC PANEL  LACTIC ACID, PLASMA  SEDIMENTATION RATE  C-REACTIVE PROTEIN  I-STAT BETA HCG BLOOD, ED ([facility], [facility], [facility] ONLY)    EKG None  Radiology DG Chest Portable 1 View  Result Date: 07/09/2020 CLINICAL DATA:  Fever EXAM: PORTABLE CHEST 1 VIEW COMPARISON:  None. FINDINGS: Lungs are clear. Heart size and pulmonary vascularity are normal. No adenopathy. No pneumothorax. No bone lesions. IMPRESSION: Lungs clear.  Cardiac silhouette normal. Electronically Signed   By: 07/11/2020 III M.D.   On: 07/09/2020 21:14    Procedures Procedures   Medications Ordered in ED Medications  acetaminophen (TYLENOL) tablet 1,000 mg (1,000 mg Oral Given 07/09/20 2112)  sodium chloride 0.9 % bolus 1,000 mL (1,000 mLs  Intravenous  New Bag/Given 07/09/20 2112)    ED Course  I have reviewed the triage vital signs and the nursing notes.  Pertinent labs & imaging results that were available during my care of the patient were reviewed by me and considered in my medical decision making (see chart for details).    MDM Rules/Calculators/A&P                          35 year old female presents to the emergency department today for evaluation of wound check and headache.  Had microdiscectomy about a week ago and since then has had serosanguineous drainage and developed a headache today  9:15 PM CONSULT with Meyran NP with neurosurgery who recommends adding a sed rate, CRP and if concern for CSF leak then obtain MRI with and without contrast.  Reconsult neurosurgery if there are abnormalities on the MRI.  Labs and MRIs ordered  Case discussed with Dr. Particia Nearing at [facility], ED who accepts patient for transfer.  Final Clinical Impression(s) / ED Diagnoses Final diagnoses:  Nonintractable headache, unspecified chronicity pattern, unspecified headache type    Rx / DC Orders ED Discharge Orders    None       Rayne Du 07/09/20 2146    Zadie Rhine, MD 07/10/20 0231

## 2020-07-10 ENCOUNTER — Emergency Department (HOSPITAL_COMMUNITY): Payer: 59

## 2020-07-10 ENCOUNTER — Observation Stay (HOSPITAL_COMMUNITY): Payer: 59

## 2020-07-10 DIAGNOSIS — Z87891 Personal history of nicotine dependence: Secondary | ICD-10-CM | POA: Diagnosis not present

## 2020-07-10 DIAGNOSIS — X58XXXD Exposure to other specified factors, subsequent encounter: Secondary | ICD-10-CM | POA: Diagnosis not present

## 2020-07-10 DIAGNOSIS — T8189XD Other complications of procedures, not elsewhere classified, subsequent encounter: Secondary | ICD-10-CM | POA: Diagnosis not present

## 2020-07-10 DIAGNOSIS — Z20822 Contact with and (suspected) exposure to covid-19: Secondary | ICD-10-CM | POA: Diagnosis not present

## 2020-07-10 DIAGNOSIS — R519 Headache, unspecified: Secondary | ICD-10-CM | POA: Diagnosis present

## 2020-07-10 DIAGNOSIS — R000 Tachycardia, unspecified: Secondary | ICD-10-CM | POA: Diagnosis not present

## 2020-07-10 LAB — CBC
HCT: 39.3 % (ref 36.0–46.0)
Hemoglobin: 13.5 g/dL (ref 12.0–15.0)
MCH: 32.3 pg (ref 26.0–34.0)
MCHC: 34.4 g/dL (ref 30.0–36.0)
MCV: 94 fL (ref 80.0–100.0)
Platelets: 266 10*3/uL (ref 150–400)
RBC: 4.18 MIL/uL (ref 3.87–5.11)
RDW: 11.1 % — ABNORMAL LOW (ref 11.5–15.5)
WBC: 11.7 10*3/uL — ABNORMAL HIGH (ref 4.0–10.5)
nRBC: 0 % (ref 0.0–0.2)

## 2020-07-10 LAB — I-STAT BETA HCG BLOOD, ED (MC, WL, AP ONLY): I-stat hCG, quantitative: <5 m[IU]/mL (ref <5)

## 2020-07-10 LAB — CREATININE, SERUM
Creatinine, Ser: 0.7 mg/dL (ref 0.44–1.00)
GFR, Estimated: >60 mL/min (ref >60)

## 2020-07-10 IMAGING — CT CT HEAD W/O CM
3 series · 16 of 47 positions shown, 19 images · non-contrast
Comparison: None

CLINICAL DATA: Headache.  Rule out intracranial hemorrhage.

EXAM:
CT HEAD WITHOUT CONTRAST
TECHNIQUE: Contiguous axial images were obtained from the base of the skull
through the vertex without intravenous contrast.

[Series 4: head 5.0 h30s · axial · 0.43mm/px · z∈[-113,+22]mm · 10 of 33 slices shown, 13 images]
[im 3/33  brain]
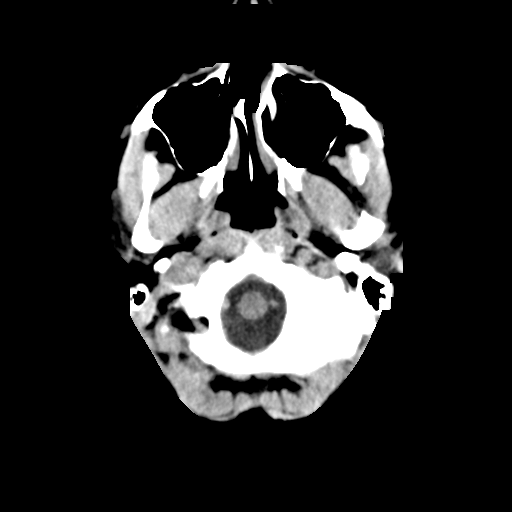
[im 3/33  bone]
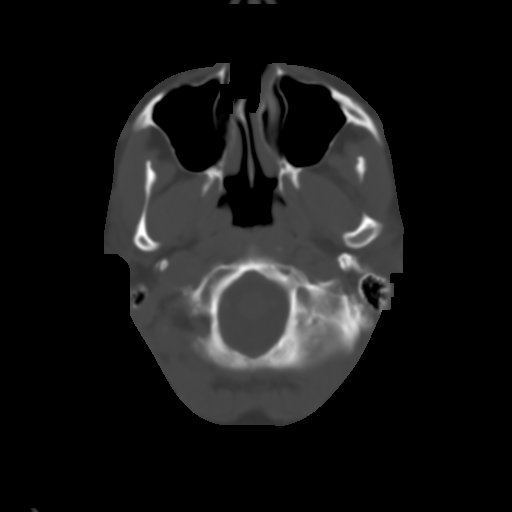
[im 6/33  brain]
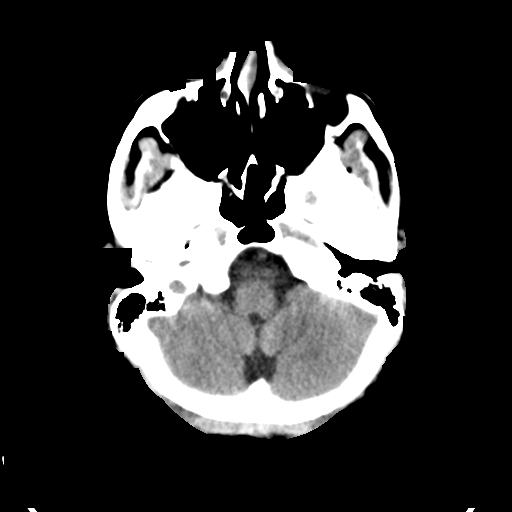
[im 9/33  brain]
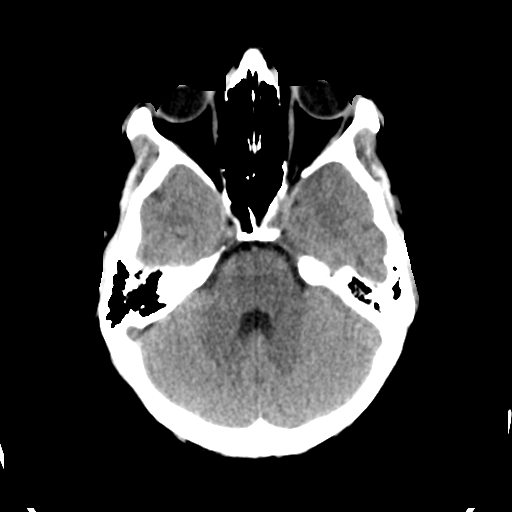
[im 12/33  brain]
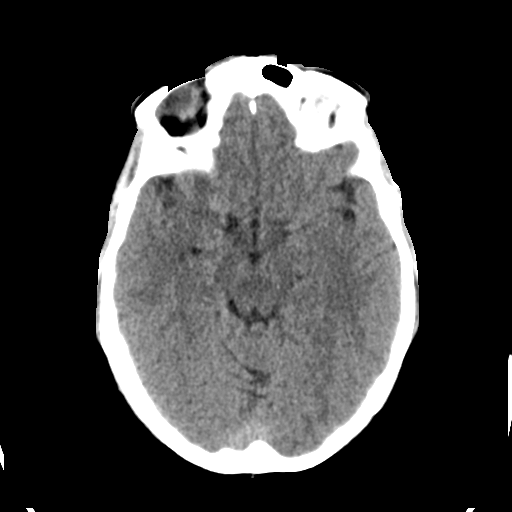
[im 15/33  brain]
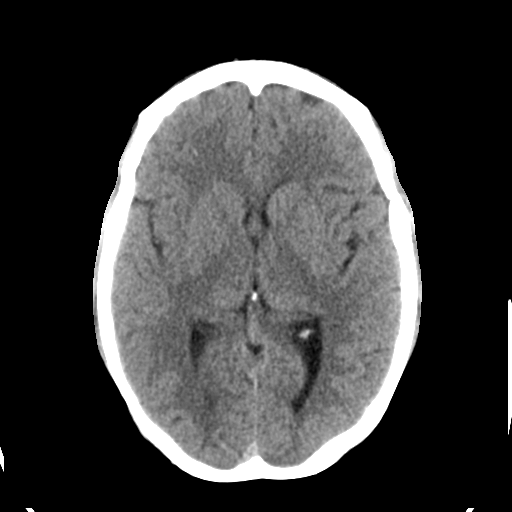
[im 15/33  bone]
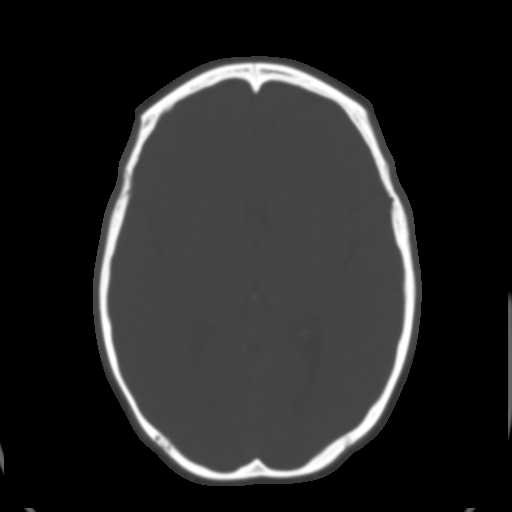
[im 18/33  brain]
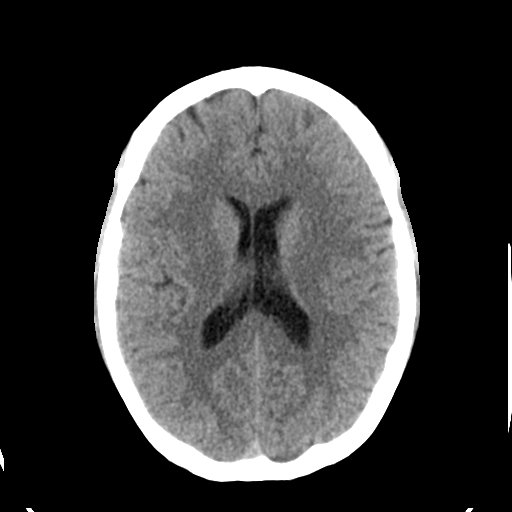
[im 21/33  brain]
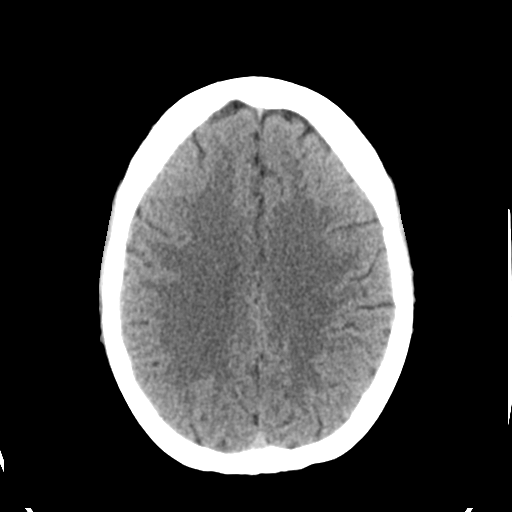
[im 25/33  brain]
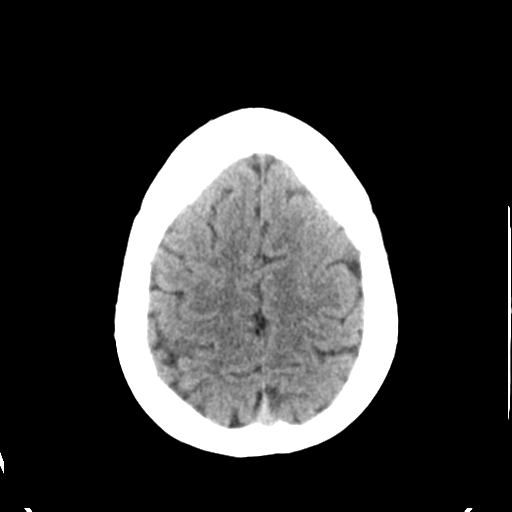
[im 27/33  brain]
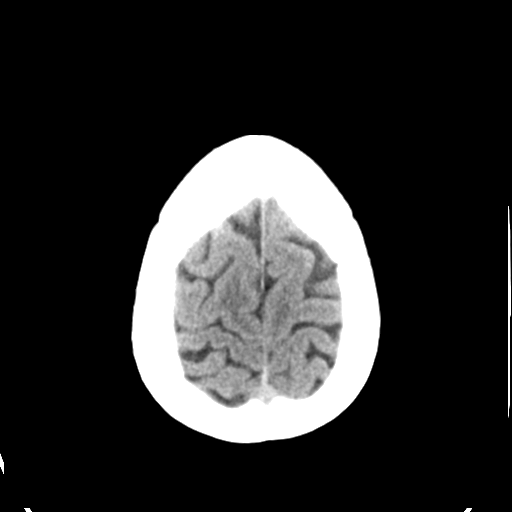
[im 27/33  bone]
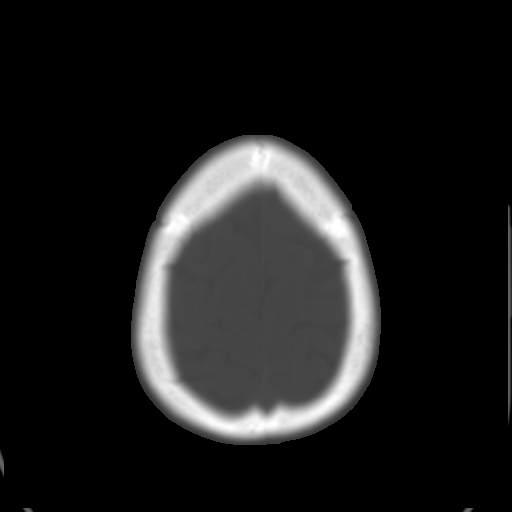
[im 30/33  brain]
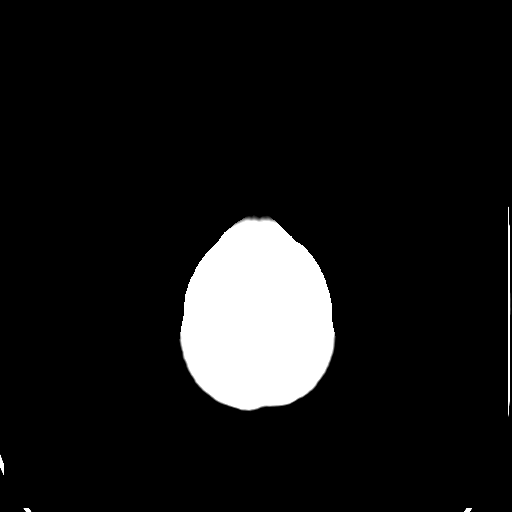

[Series 5: head 3.0 mpr cor · coronal · 0.31mm/px · 3 of 67 slices shown]
[im 23/67  brain]
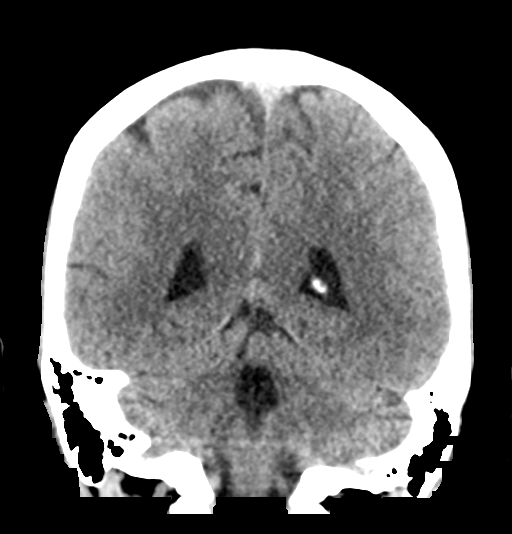
[im 30/67  brain]
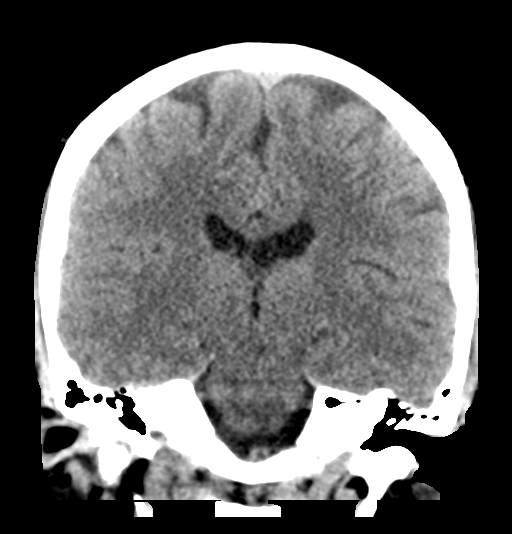
[im 37/67  brain]
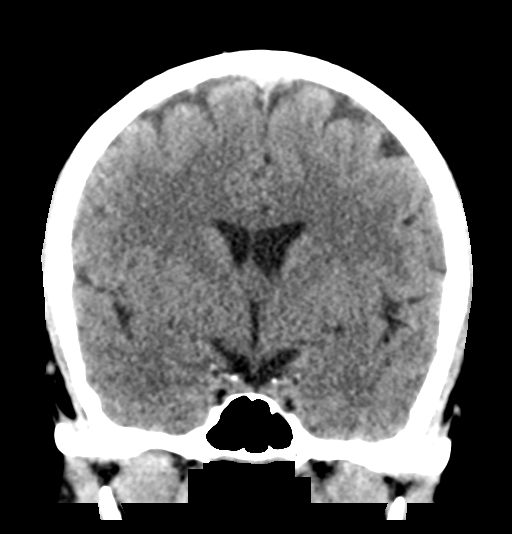

[Series 6: head 3.0 mpr sag · sagittal · 0.33mm/px · 3 of 54 slices shown]
[im 18/54  brain]
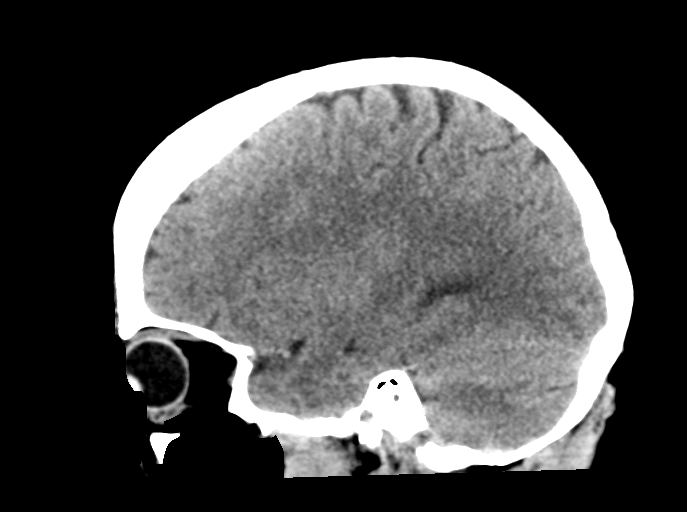
[im 27/54  brain]
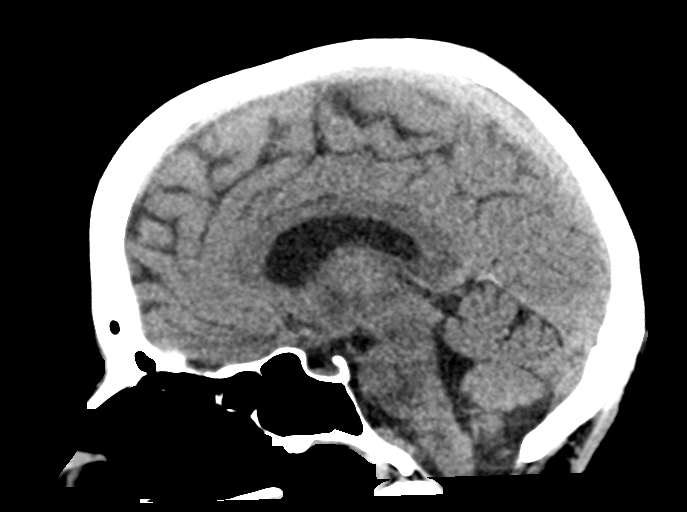
[im 36/54  brain]
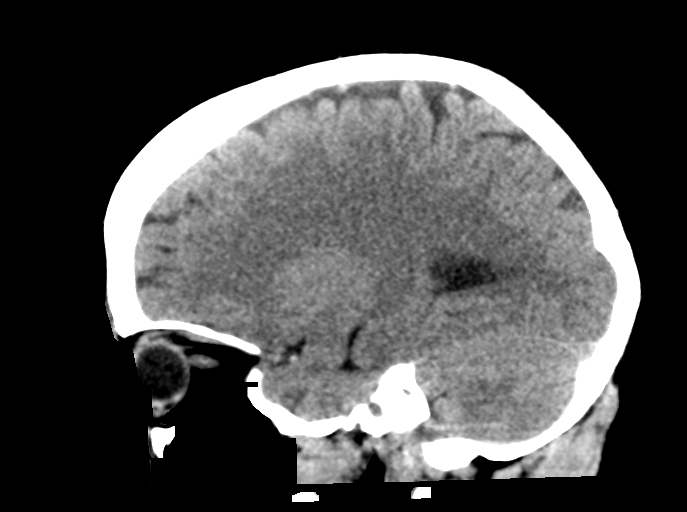

[16 of 47 positions shown; findings below may reference images not displayed]

FINDINGS: Brain: No evidence of acute infarction, hemorrhage, hydrocephalus,
extra-axial collection or mass lesion/mass effect.

Vascular: No hyperdense vessel or unexpected calcification.

Skull: Normal. Negative for fracture or focal lesion.

Sinuses/Orbits: The paranasal sinuses and mastoid air cells appear
clear.

Other: None.
IMPRESSION: No acute intracranial abnormalities. Normal brain.

## 2020-07-10 IMAGING — MR MR LUMBAR SPINE WO/W CM
8 of 9 series · 28 of 48 positions shown · IV contrast (gadavist)
Comparison: [DATE]

CLINICAL DATA: Low back pain.  Recent lumbar surgery

EXAM:
MRI LUMBAR SPINE WITHOUT AND WITH CONTRAST
TECHNIQUE: Multiplanar and multiecho pulse sequences of the lumbar spine were
obtained without and with intravenous contrast.
CONTRAST:  7.5mL GADAVIST GADOBUTROL 1 MMOL/ML IV SOLN

[Series 5: T2 · sagittal · 4.0mm · 0.73mm/px · 3 of 16 slices shown (1 of 3)]
[im 1/16]
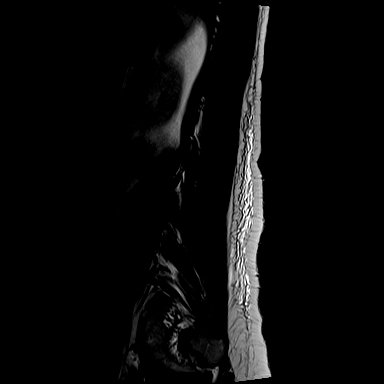
[im 8/16]
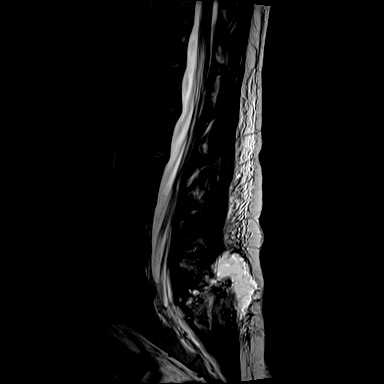
[im 16/16]
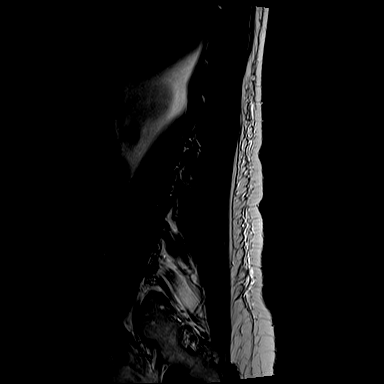

[Series 6: STIR · sagittal · 4.0mm · 0.55mm/px · 2 of 16 slices shown]
[im 1/16]
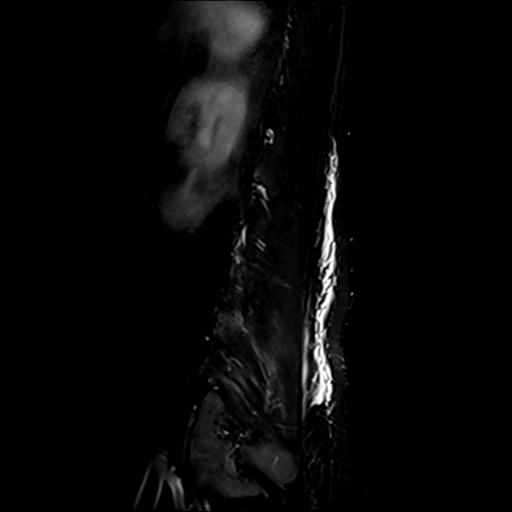
[im 16/16]
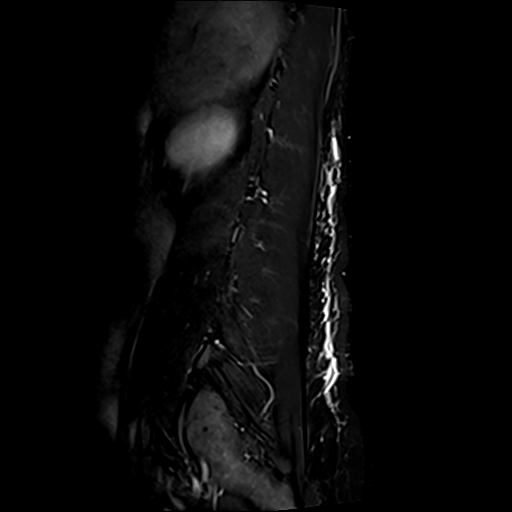

[Series 7: T1 · sagittal · 4.0mm · 0.88mm/px · 2 of 16 slices shown (1 of 2)]
[im 1/16]
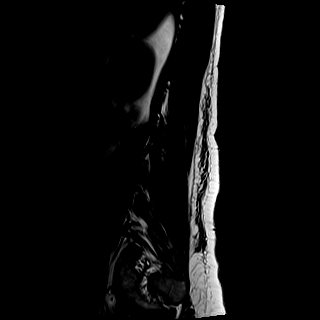
[im 16/16]
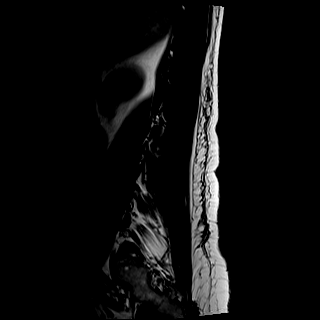

[Series 8: T2 · axial · 4.0mm · 0.57mm/px · z∈[-89,+141]mm · 4 of 38 slices shown (2 of 3)]
[im 1/38]
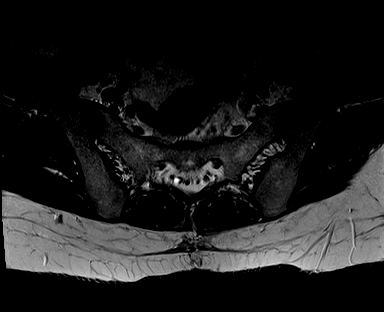
[im 13/38]
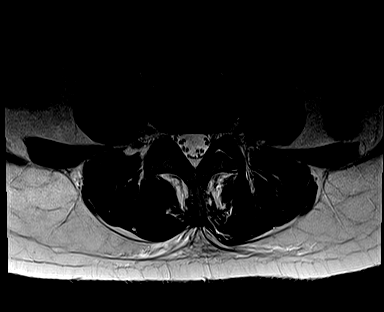
[im 25/38]
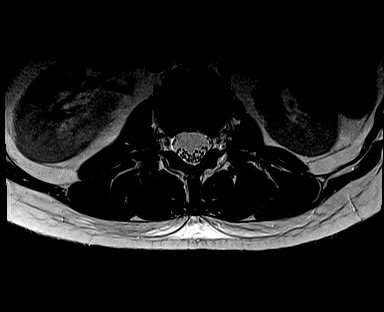
[im 38/38]
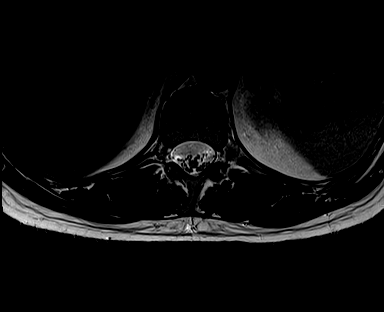

[Series 9: T1 · axial · 4.0mm · 0.34mm/px · z∈[-89,+141]mm · 4 of 38 slices shown (2 of 2)]
[im 1/38]
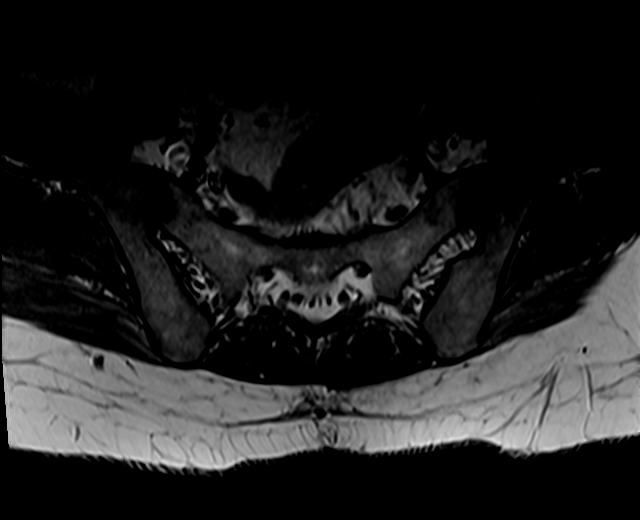
[im 13/38]
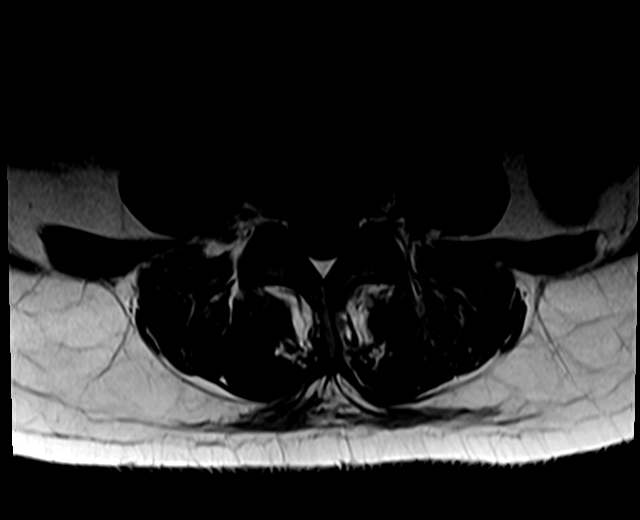
[im 25/38]
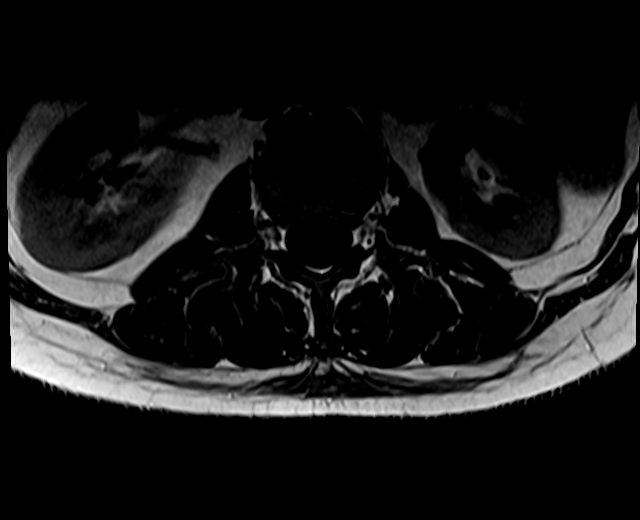
[im 38/38]
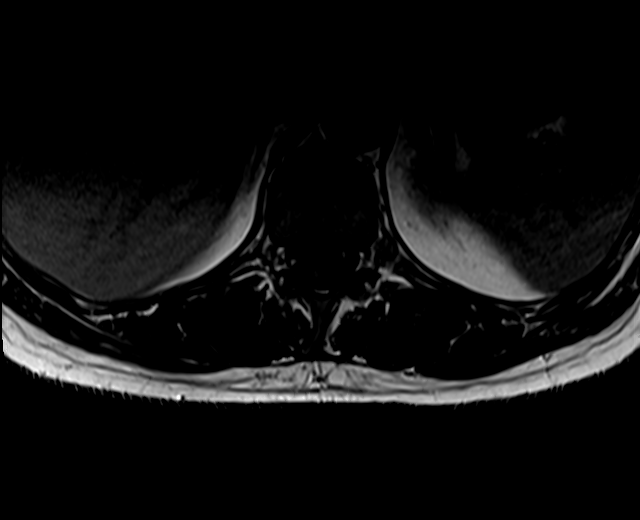

[Series 11: T1 fat-sat post-contrast · sagittal · 4.0mm · 0.88mm/px · 2 of 16 slices shown]
[im 1/16]
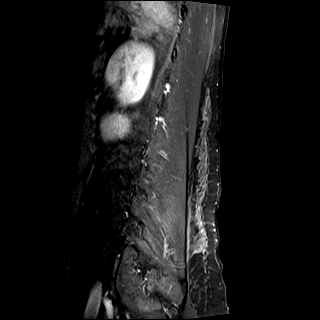
[im 16/16]
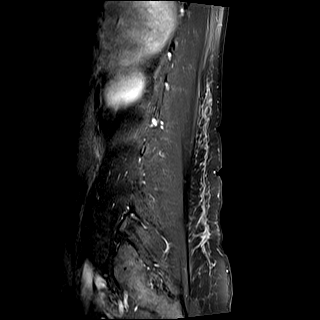

[Series 12: T1 post-contrast · axial · 4.0mm · 0.34mm/px · 1 of 38 slices shown]
[im 1/38]
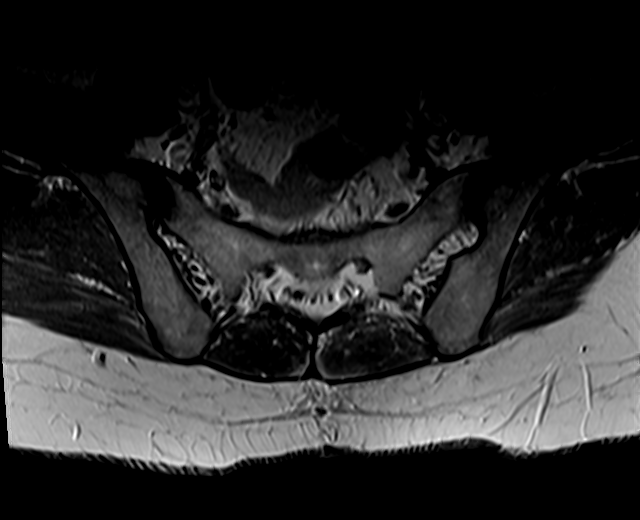

[Series 1013: T2 · axial · 2.0mm · 0.29mm/px · z∈[-100,+164]mm · 10 of 150 slices shown (3 of 3)]
[im 9/150]
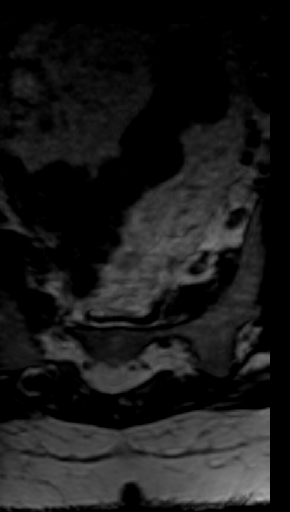
[im 27/150]
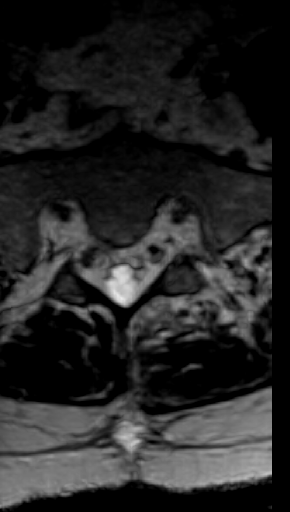
[im 44/150]
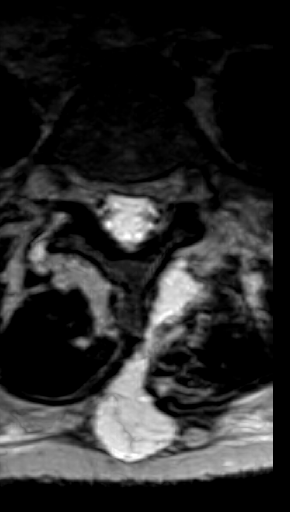
[im 62/150]
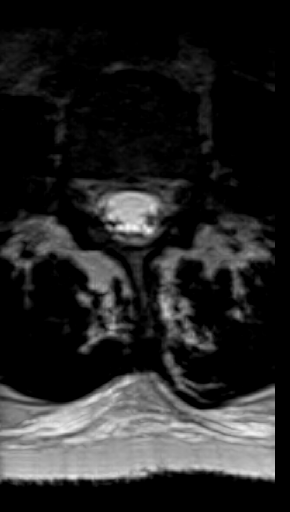
[im 79/150]
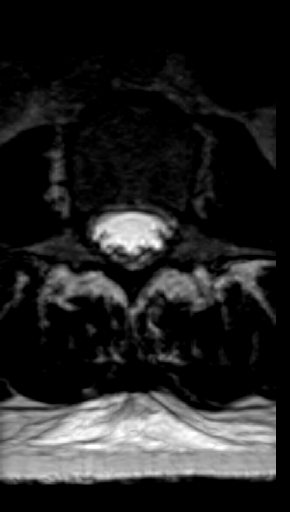
[im 88/150]
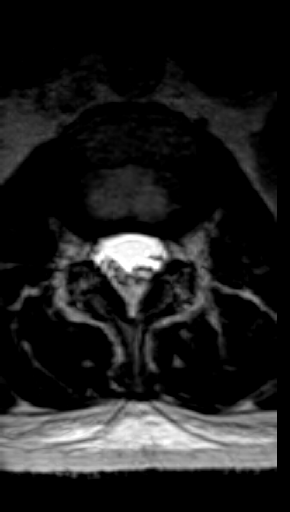
[im 106/150]
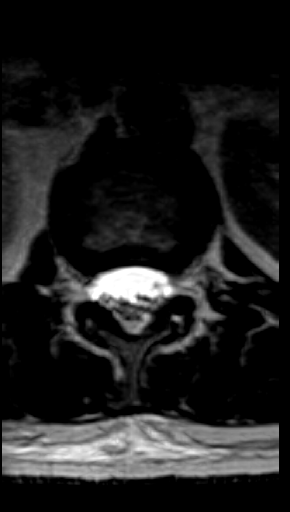
[im 123/150]
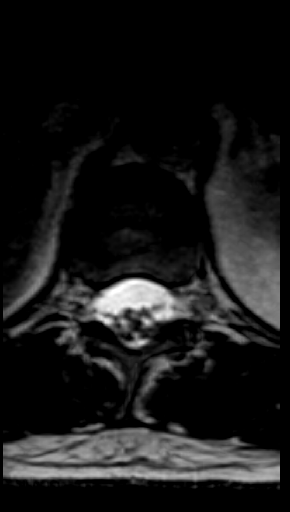
[im 132/150]
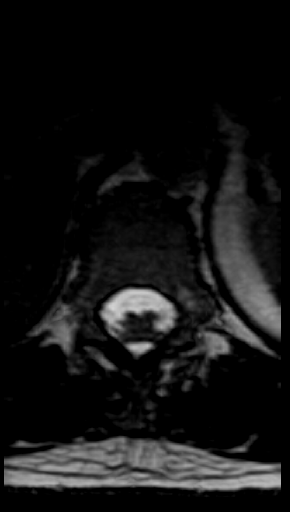
[im 141/150]
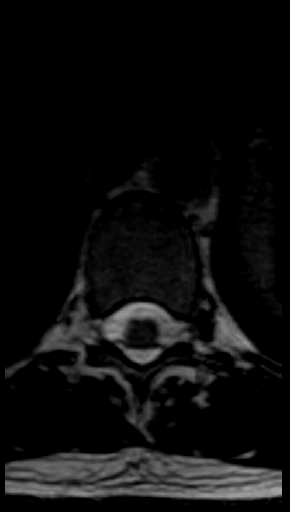

[28 of 48 positions shown; findings below may reference images not displayed]

FINDINGS: Segmentation:  5 lumbar type vertebrae

Alignment:  Normal

Vertebrae:  Unremarkable postoperative changes.

Conus medullaris and cauda equina: Conus extends to the L1 level.
Conus and cauda equina appear normal.

Paraspinal and other soft tissues: Fluid collection within the
incision extending from subcutaneous space to laminectomy defect,
without thecal sac mass effect.

Disc levels:

Focal disc degeneration at L5-S1 with left paracentral protrusion
and recent micro discectomy. There is a small recurrent herniation
with nonenhancing disc material best seen on sagittal T1 weighted
imaging due to slice selection on axial images. The herniation
posteriorly displaces the left S1 nerve root.
IMPRESSION: 1. L5-S1 small recurrent left paracentral herniation which displaces
the left S1 nerve root.
2. Incisional fluid collection without worrisome mass effect.

## 2020-07-10 MED ORDER — LORATADINE 10 MG PO TABS
10.0000 mg | ORAL_TABLET | Freq: Every day | ORAL | Status: DC
  Administered 2020-07-10 – 2020-07-11 (×2): 10 mg via ORAL
  Filled 2020-07-10 (×2): qty 1

## 2020-07-10 MED ORDER — ACETAMINOPHEN 325 MG PO TABS: 650.0000 mg | ORAL_TABLET | ORAL | Status: DC | PRN

## 2020-07-10 MED ORDER — DESOGESTREL-ETHINYL ESTRADIOL 0.15-30 MG-MCG PO TABS: 1.0000 | ORAL_TABLET | Freq: Every day | ORAL | Status: DC

## 2020-07-10 MED ORDER — HYDROMORPHONE HCL 1 MG/ML IJ SOLN
1.0000 mg | Freq: Once | INTRAMUSCULAR | Status: AC
  Administered 2020-07-10: 1 mg via INTRAVENOUS
  Filled 2020-07-10 (×2): qty 1

## 2020-07-10 MED ORDER — SODIUM CHLORIDE 0.9 % IV SOLN: 250.0000 mL | INTRAVENOUS | Status: DC

## 2020-07-10 MED ORDER — AMPHETAMINE-DEXTROAMPHETAMINE 10 MG PO TABS
20.0000 mg | ORAL_TABLET | Freq: Two times a day (BID) | ORAL | Status: DC
  Administered 2020-07-11: 20 mg via ORAL
  Filled 2020-07-10: qty 2
  Filled 2020-07-10: qty 1
  Filled 2020-07-10 (×2): qty 2

## 2020-07-10 MED ORDER — SODIUM CHLORIDE 0.9 % IV BOLUS
1000.0000 mL | Freq: Once | INTRAVENOUS | Status: AC
  Administered 2020-07-10: 1000 mL via INTRAVENOUS

## 2020-07-10 MED ORDER — VORTIOXETINE HBR 5 MG PO TABS
10.0000 mg | ORAL_TABLET | Freq: Every day | ORAL | Status: DC
  Administered 2020-07-10 – 2020-07-11 (×2): 10 mg via ORAL
  Filled 2020-07-10 (×2): qty 2

## 2020-07-10 MED ORDER — POTASSIUM CHLORIDE IN NACL 20-0.9 MEQ/L-% IV SOLN
INTRAVENOUS | Status: DC
  Filled 2020-07-10 (×4): qty 1000

## 2020-07-10 MED ORDER — DEXAMETHASONE 4 MG PO TABS
4.0000 mg | ORAL_TABLET | Freq: Four times a day (QID) | ORAL | Status: DC
  Administered 2020-07-10 – 2020-07-11 (×5): 4 mg via ORAL
  Filled 2020-07-10 (×5): qty 1

## 2020-07-10 MED ORDER — MORPHINE SULFATE (PF) 2 MG/ML IV SOLN
2.0000 mg | INTRAVENOUS | Status: DC | PRN
  Administered 2020-07-10: 1 mg via INTRAVENOUS
  Administered 2020-07-10 (×2): 2 mg via INTRAVENOUS
  Filled 2020-07-10 (×3): qty 1

## 2020-07-10 MED ORDER — DIPHENHYDRAMINE HCL 50 MG/ML IJ SOLN
25.0000 mg | Freq: Once | INTRAMUSCULAR | Status: AC
  Administered 2020-07-10: 25 mg via INTRAVENOUS
  Filled 2020-07-10: qty 1

## 2020-07-10 MED ORDER — ONDANSETRON HCL 4 MG/2ML IJ SOLN: 4.0000 mg | Freq: Four times a day (QID) | INTRAMUSCULAR | Status: DC | PRN

## 2020-07-10 MED ORDER — HYDROCODONE-ACETAMINOPHEN 7.5-325 MG PO TABS
1.0000 | ORAL_TABLET | Freq: Four times a day (QID) | ORAL | Status: DC
  Administered 2020-07-10 – 2020-07-11 (×5): 1 via ORAL
  Filled 2020-07-10 (×5): qty 1

## 2020-07-10 MED ORDER — SODIUM CHLORIDE 0.9% FLUSH: 3.0000 mL | INTRAVENOUS | Status: DC | PRN

## 2020-07-10 MED ORDER — POTASSIUM CHLORIDE CRYS ER 10 MEQ PO TBCR
10.0000 meq | EXTENDED_RELEASE_TABLET | Freq: Two times a day (BID) | ORAL | Status: DC
  Administered 2020-07-10 – 2020-07-11 (×3): 10 meq via ORAL
  Filled 2020-07-10 (×3): qty 1

## 2020-07-10 MED ORDER — POTASSIUM CHLORIDE ER 20 MEQ PO TBCR: 10.0000 meq | EXTENDED_RELEASE_TABLET | Freq: Two times a day (BID) | ORAL | Status: DC

## 2020-07-10 MED ORDER — HEPARIN SODIUM (PORCINE) 5000 UNIT/ML IJ SOLN
5000.0000 [IU] | Freq: Three times a day (TID) | INTRAMUSCULAR | Status: DC
  Administered 2020-07-10 – 2020-07-11 (×3): 5000 [IU] via SUBCUTANEOUS
  Filled 2020-07-10 (×3): qty 1

## 2020-07-10 MED ORDER — FLUDROCORTISONE ACETATE 0.1 MG PO TABS
0.1000 mg | ORAL_TABLET | Freq: Two times a day (BID) | ORAL | Status: DC
  Administered 2020-07-10 – 2020-07-11 (×3): 0.1 mg via ORAL
  Filled 2020-07-10 (×3): qty 1

## 2020-07-10 MED ORDER — ACETAMINOPHEN 650 MG RE SUPP: 650.0000 mg | RECTAL | Status: DC | PRN

## 2020-07-10 MED ORDER — GADOBUTROL 1 MMOL/ML IV SOLN
7.5000 mL | Freq: Once | INTRAVENOUS | Status: AC | PRN
  Administered 2020-07-10: 7.5 mL via INTRAVENOUS

## 2020-07-10 MED ORDER — ONDANSETRON HCL 4 MG PO TABS: 4.0000 mg | ORAL_TABLET | Freq: Four times a day (QID) | ORAL | Status: DC | PRN

## 2020-07-10 MED ORDER — DEXAMETHASONE SODIUM PHOSPHATE 10 MG/ML IJ SOLN
10.0000 mg | Freq: Once | INTRAMUSCULAR | Status: AC
  Administered 2020-07-10: 10 mg via INTRAVENOUS
  Filled 2020-07-10: qty 1

## 2020-07-10 MED ORDER — CYCLOBENZAPRINE HCL 10 MG PO TABS: 10.0000 mg | ORAL_TABLET | Freq: Two times a day (BID) | ORAL | Status: DC | PRN

## 2020-07-10 MED ORDER — SODIUM CHLORIDE 0.9% FLUSH
3.0000 mL | Freq: Two times a day (BID) | INTRAVENOUS | Status: DC
  Administered 2020-07-10 – 2020-07-11 (×3): 3 mL via INTRAVENOUS

## 2020-07-10 MED ORDER — DEXAMETHASONE SODIUM PHOSPHATE 4 MG/ML IJ SOLN
4.0000 mg | Freq: Four times a day (QID) | INTRAMUSCULAR | Status: DC
  Filled 2020-07-10: qty 1

## 2020-07-10 MED ORDER — PROCHLORPERAZINE EDISYLATE 10 MG/2ML IJ SOLN
10.0000 mg | Freq: Once | INTRAMUSCULAR | Status: AC
  Administered 2020-07-10: 10 mg via INTRAVENOUS
  Filled 2020-07-10: qty 2

## 2020-07-10 MED ORDER — HYDROMORPHONE HCL 1 MG/ML IJ SOLN
1.0000 mg | Freq: Once | INTRAMUSCULAR | Status: AC
  Administered 2020-07-10: 1 mg via INTRAVENOUS
  Filled 2020-07-10: qty 1

## 2020-07-10 MED ORDER — SENNA 8.6 MG PO TABS
1.0000 | ORAL_TABLET | Freq: Two times a day (BID) | ORAL | Status: DC
  Administered 2020-07-10 – 2020-07-11 (×3): 8.6 mg via ORAL
  Filled 2020-07-10 (×3): qty 1

## 2020-07-10 MED ORDER — GABAPENTIN 600 MG PO TABS: 600.0000 mg | ORAL_TABLET | ORAL | Status: DC

## 2020-07-10 MED ORDER — GABAPENTIN 300 MG PO CAPS
600.0000 mg | ORAL_CAPSULE | Freq: Two times a day (BID) | ORAL | Status: DC
  Administered 2020-07-10 – 2020-07-11 (×3): 600 mg via ORAL
  Filled 2020-07-10 (×4): qty 2

## 2020-07-10 MED ORDER — ALBUTEROL SULFATE HFA 108 (90 BASE) MCG/ACT IN AERS
2.0000 | INHALATION_SPRAY | RESPIRATORY_TRACT | Status: DC | PRN
  Filled 2020-07-10: qty 6.7

## 2020-07-10 MED ORDER — GABAPENTIN 400 MG PO CAPS
1800.0000 mg | ORAL_CAPSULE | Freq: Every day | ORAL | Status: DC
  Administered 2020-07-10: 1800 mg via ORAL
  Filled 2020-07-10: qty 2

## 2020-07-10 MED ORDER — ALPRAZOLAM 0.5 MG PO TABS
0.5000 mg | ORAL_TABLET | Freq: Every day | ORAL | Status: DC
  Administered 2020-07-10: 0.5 mg via ORAL
  Filled 2020-07-10: qty 1

## 2020-07-10 MED ORDER — LEVOCETIRIZINE DIHYDROCHLORIDE 5 MG PO TABS: 5.0000 mg | ORAL_TABLET | Freq: Every day | ORAL | Status: DC

## 2020-07-10 NOTE — ED Notes (Addendum)
Non-stick and 2x2 gauze to surgical area for padding and comfort

## 2020-07-10 NOTE — Progress Notes (Signed)
Received pt from ED. Pt GCS 15 appears in no apparent distress, pt denies nausea, full motor sensation intact. Pt reports headache "feels better", pt wanting to eat.  Pt mother at bedside

## 2020-07-10 NOTE — ED Notes (Signed)
Report called  

## 2020-07-10 NOTE — ED Notes (Signed)
Report from Royetta Crochet RN at this time

## 2020-07-10 NOTE — ED Notes (Signed)
Pt taken to MRI  

## 2020-07-10 NOTE — ED Provider Notes (Signed)
D/w neurosurgery who will see patient   Zadie Rhine, MD 07/10/20 [phone]

## 2020-07-10 NOTE — ED Provider Notes (Signed)
Signed out to Dr. Dalene Seltzer at shift change to f/u on NSGY recs   Zadie Rhine, MD 07/10/20 [phone]

## 2020-07-10 NOTE — H&P (Signed)
Subjective: Patient is a 35 y.o. female who complains of drainage from her wound and the onset of headache. Onset of symptoms was 1 day ago, gradually worsening since that time.  Onset was not related to a fall. The pain is rated severe, and is holocephalic.  It is not necessarily related to position.  She states is the worst headache she is ever had and unrelenting.  The pain is described as aching and occurs all day. The symptoms have been progressive. Symptoms are exacerbated by nothing in particular. The patient has tried pain medications through the emergency department. MRI showed postoperative fluid collection with may be a small recurrent disc.  Fluid appears to have been either brownish or rust colored or light red.  It has slowed down over the last 4 hours.  Her back and leg are doing okay.  Past Medical History:  Diagnosis Date  . Anxiety   . Back injury   . Chronic pelvic pain in female   . Endometriosis   . Endometriosis determined by laparoscopy 10/06/2014  . Ovarian cyst   . Pelvic pain in female 10/06/2014  . PONV (postoperative nausea and vomiting)    pt states that with 1st arthroscopy she "passed out and couldnt left her head" but they tell me it was a combination of the pain meds and anesthesia" No problems with next surgery. Not sure of meds given in anesthesia.  Marland Kitchen POTS (postural orthostatic tachycardia syndrome)    pt reports this was diagnosed a couple of months ago by PCP (Dr Annia Friendly)  . PTSD (post-traumatic stress disorder)     Past Surgical History:  Procedure Laterality Date  . ABDOMINAL HYSTERECTOMY    . endometrosis  2014   [facility]geration-[facility]  . KNEE SURGERY Bilateral    x2  . LAPAROSCOPIC BILATERAL SALPINGO OOPHERECTOMY Left 07/31/2018   Procedure: LAPAROSCOPIC LEFT SALPINGO OOPHORECTOMY;  Surgeon: Lazaro Arms, MD;  Location: [facility];  Service: Gynecology;  Laterality: Left;  . LAPAROSCOPY N/A 11/26/2014   Procedure: DIAGNOSTIC LAPAROSCOPY;  Surgeon: [name], MD;  Location: [facility];  Service: Gynecology;  Laterality: N/A;  . MICRODISCECTOMY LUMBAR  07/01/2020    Allergies  Allergen Reactions  . Almond Meal Anaphylaxis  . Cymbalta [Duloxetine Hcl]   . Sulfa Antibiotics Hives  . Trazodone And Nefazodone     Sever body pain   . Sudafed [Pseudoephedrine Hcl] Palpitations    Social History   Tobacco Use  . Smoking status: Former Smoker    Packs/day: 0.50    Types: Cigarettes, E-cigarettes    Quit date: 02/28/2019    Years since quitting: 1.3  . Smokeless tobacco: Never Used  Substance Use Topics  . Alcohol use: Yes    Comment: occ    Family History  Problem Relation Age of Onset  . Cancer Father        prostate  . Other Sister        IBS  . Other Brother        Lyme Disease  . Dementia Maternal Grandmother   . Parkinson's disease Maternal Grandfather   . Thrombocytopenia Sister    Prior to Admission medications   Medication Sig Start Date End Date Taking? Authorizing Provider  albuterol (VENTOLIN HFA) 108 (90 Base) MCG/ACT inhaler Inhale 2 puffs into the lungs every 4 (four) hours as needed for wheezing or shortness of breath. 04/19/19  Yes [provider]  ALPRAZolam (XANAX) 0.5 MG tablet Take 0.5 mg by mouth at bedtime.  Yes [provider]  amphetamine-dextroamphetamine (ADDERALL) 20 MG tablet Take 20 mg by mouth 2 (two) times daily.   Yes [provider]  APRI 0.15-30 MG-MCG tablet TAKE 1 TABLET BY MOUTH EVERY DAY Patient taking differently: Take 1 tablet by mouth daily. 03/30/20  Yes Lazaro Arms, MD  cyclobenzaprine (FLEXERIL) 10 MG tablet Take 1 tablet (10 mg total) by mouth 2 (two) times daily as needed for muscle spasms. 04/15/20  Yes Moshe Cipro, NP  doxycycline (ADOXA) 100 MG tablet Take 100 mg by mouth See admin instructions. Bid x 10 days 07/09/20  Yes [provider]  fludrocortisone (FLORINEF) 0.1 MG tablet Take 1 tablet (0.1 mg total) by mouth 2 (two) times daily.  10/30/19  Yes Marinus Maw, MD  gabapentin (NEURONTIN) 600 MG tablet Take 600-1,800 mg by mouth See admin instructions. 600 mg bid, 1800 mg at bedtime   Yes [provider]  ibuprofen (ADVIL) 200 MG tablet Take 800 mg by mouth every 6 (six) hours as needed for mild pain or moderate pain.   Yes [provider]  levocetirizine (XYZAL) 5 MG tablet Take 5 mg by mouth daily.   Yes [provider]  ondansetron (ZOFRAN ODT) 4 MG disintegrating tablet Take 1 tablet (4 mg total) by mouth every 8 (eight) hours as needed for nausea or vomiting. 05/31/19  Yes Idol, Raynelle Fanning, PA-C  polyethylene glycol powder (GLYCOLAX/MIRALAX) 17 GM/SCOOP powder Take 17 g by mouth daily as needed for mild constipation.   Yes [provider]  vortioxetine HBr (TRINTELLIX) 10 MG TABS tablet Take 10 mg by mouth daily.   Yes [provider]  potassium chloride 20 MEQ TBCR Take 10 mEq by mouth 2 (two) times daily. Patient not taking: No sig reported 05/17/20   Mancel Bale, MD     Review of Systems  Positive ROS: Negative, history of pots disease  All other systems have been reviewed and were otherwise negative with the exception of those mentioned in the HPI and as above.  Objective: Vital signs in last 24 hours: Temp:  [98.5 F (36.9 C)-100.1 F (37.8 C)] 98.7 F (37.1 C) (05/15 0311) Pulse Rate:  [77-135] 85 (05/15 0600) Resp:  [9-20] 14 (05/15 0600) BP: (136-175)/(87-113) 158/106 (05/15 0600) SpO2:  [96 %-100 %] 99 % (05/15 0600) Weight:  [72.6 kg] 72.6 kg (05/14 1944)  General Appearance: Alert, cooperative, no distress, appears stated age Head: Normocephalic, without obvious abnormality, atraumatic Eyes: PERRL, conjunctiva/corneas clear, EOM's intact     Ears: Normal TM's and external ear canals, both ears Throat: Lips, mucosa, and tongue normal; teeth and gums normal Neck: Supple Back: Incision is clean dry and intact without signs of infection.  We can milk it and  get a small drop of pink fluid.  We placed 1 staple in the wound and had her stand up and saw no drainage Lungs:  respirations unlabored Heart: Regular rate and rhythm Abdomen: Soft Extremities: Extremities normal, atraumatic, no cyanosis or edema Pulses: 2+ and symmetric all extremities Skin: Skin color, texture, turgor normal, no rashes or lesions  NEUROLOGIC:   Mental status: alert and oriented, no aphasia, good attention span, Fund of knowledge/ memory ok Motor Exam - grossly normal Sensory Exam - grossly normal Reflexes: Normal Coordination - grossly normal Gait - grossly normal Balance - grossly normal Cranial Nerves: I: smell Not tested  II: visual acuity  OS: na    OD: na  II: visual fields Full to confrontation  II:  pupils Equal, round, reactive to light  III,VII: ptosis None  III,IV,VI: extraocular muscles  Full ROM  V: mastication Normal  V: facial light touch sensation  Normal  V,VII: corneal reflex  Present  VII: facial muscle function - upper  Normal  VII: facial muscle function - lower Normal  VIII: hearing Not tested  IX: soft palate elevation  Normal  IX,X: gag reflex Present  XI: trapezius strength  5/5  XI: sternocleidomastoid strength 5/5  XI: neck flexion strength  5/5  XII: tongue strength  Normal    Data Review Lab Results  Component Value Date   WBC 8.4 07/09/2020   HGB 12.9 07/09/2020   HCT 36.5 07/09/2020   MCV 93.1 07/09/2020   PLT 278 07/09/2020   Lab Results  Component Value Date   NA 138 07/09/2020   K 3.2 (L) 07/09/2020   CL 105 07/09/2020   CO2 24 07/09/2020   BUN 11 07/09/2020   CREATININE 0.69 07/09/2020   GLUCOSE 100 (H) 07/09/2020   No results found for: INR, PROTIME  Assessment/Plan: 35 year old female who is about 8 days status post microdiscectomy who comes in with drainage from her wound.  She called twice yesterday and then presented to the emergency department when the headache developed.  MRI does have a  postoperative fluid collection but it probably looks no different than typical microdiscectomy in the first 2 weeks.  The picture of the drainage on the gauze put in by the PA in the ED looks brown and serosanguineous.  The fluid is not running out of the wound like typical CSF.  But I explained to her that really only time will tell.  Could be early wound infection but the wound looks quite good and her sed rate is not overly high.  We will admit her for hydration and some steroids and observation.  Pool comes on call at 3 PM.   Tia Alert 07/10/2020 7:31 AM

## 2020-07-10 NOTE — ED Notes (Signed)
Pt in MRI at this time 

## 2020-07-10 NOTE — ED Notes (Signed)
Pt c/o of headache, crying. States that she is in pain and that is why she was crying. Pt was made aware of admit status.

## 2020-07-10 NOTE — ED Notes (Signed)
Provider at bedside

## 2020-07-11 MED ORDER — HYDROCODONE-ACETAMINOPHEN 5-325 MG PO TABS: 1.0000 | ORAL_TABLET | ORAL | 0 refills | Status: DC | PRN

## 2020-07-11 NOTE — Discharge Instructions (Signed)

## 2020-07-11 NOTE — Discharge Summary (Signed)
Physician Discharge Summary  Patient ID: Rachel Griffith MRN: [id number] DOB/AGE: 36-Aug-1987 36 y.o.  Admit date: 07/09/2020 Discharge date: 07/11/2020  Admission Diagnoses:  Discharge Diagnoses:  Active Problems:   Headache   Discharged Condition: good  Hospital Course: Patient admitted to hospital for evaluation of severe headache and some intermittent wound drainage.  Patient recently status post lumbar microdiscectomy.  Surgery uncomplicated.  Patient initially did well but developed severe headache suddenly on Saturday.  Patient had some intermittent scant drainage from the wound which was not consistent with CSF.  There is no evidence of CSF leak during her surgery.  Headache was not postural.  It was related to severe hypertension which is related to her chronic steroid use for her pots syndrome.  The patient has been admitted to hospital and observed.  She shows no signs of worsening wound drainage or consistent with either infection or CSF leak.  She feels much better today.  Headache is now resolved.  She is having minimal back pain.  She is having no lower extremity pain.  She feels comfortable with discharge home.  Consults:   Significant Diagnostic Studies:   Treatments:   Discharge Exam: Blood pressure 127/83, pulse 83, temperature 98.5 F (36.9 C), temperature source Oral, resp. rate 18, height 5\' 9"  (1.753 m), weight 72.6 kg, last menstrual period 09/19/2014, SpO2 98 %. Awake and alert.  Oriented and appropriate.  Motor and sensory function intact.  Her wound is clean and dry.  Chest and abdomen are benign.  Extremities are free from injury or deformity.  Disposition: Discharge disposition: 01-Home or Self Care        Allergies as of 07/11/2020      Reactions   Almond Meal Anaphylaxis   Cymbalta [duloxetine Hcl]    Sulfa Antibiotics Hives   Trazodone And Nefazodone    Sever body pain   Sudafed [pseudoephedrine Hcl] Palpitations      Medication List     TAKE these medications   albuterol 108 (90 Base) MCG/ACT inhaler Commonly known as: VENTOLIN HFA Inhale 2 puffs into the lungs every 4 (four) hours as needed for wheezing or shortness of breath.   ALPRAZolam 0.5 MG tablet Commonly known as: XANAX Take 0.5 mg by mouth at bedtime.   amphetamine-dextroamphetamine 20 MG tablet Commonly known as: ADDERALL Take 20 mg by mouth 2 (two) times daily.   Apri 0.15-30 MG-MCG tablet Generic drug: desogestrel-ethinyl estradiol TAKE 1 TABLET BY MOUTH EVERY DAY   cyclobenzaprine 10 MG tablet Commonly known as: FLEXERIL Take 1 tablet (10 mg total) by mouth 2 (two) times daily as needed for muscle spasms.   doxycycline 100 MG tablet Commonly known as: ADOXA Take 100 mg by mouth See admin instructions. Bid x 10 days   fludrocortisone 0.1 MG tablet Commonly known as: FLORINEF Take 1 tablet (0.1 mg total) by mouth 2 (two) times daily.   gabapentin 600 MG tablet Commonly known as: NEURONTIN Take 600-1,800 mg by mouth See admin instructions. 600 mg bid, 1800 mg at bedtime   ibuprofen 200 MG tablet Commonly known as: ADVIL Take 800 mg by mouth every 6 (six) hours as needed for mild pain or moderate pain.   levocetirizine 5 MG tablet Commonly known as: XYZAL Take 5 mg by mouth daily.   ondansetron 4 MG disintegrating tablet Commonly known as: Zofran ODT Take 1 tablet (4 mg total) by mouth every 8 (eight) hours as needed for nausea or vomiting.   polyethylene glycol powder 17 GM/SCOOP  powder Commonly known as: GLYCOLAX/MIRALAX Take 17 g by mouth daily as needed for mild constipation.   Potassium Chloride ER 20 MEQ Tbcr Take 10 mEq by mouth 2 (two) times daily.   vortioxetine HBr 10 MG Tabs tablet Commonly known as: TRINTELLIX Take 10 mg by mouth daily.        Signed: Kathaleen Maser [name] 07/11/2020, 7:31 AM

## 2020-07-11 NOTE — Progress Notes (Signed)
RN gave pt discharge instructions and the patient state understanding IV has been removed, belongings packed. 1 new medication escribed to home pharmacy.

## 2020-07-11 NOTE — Progress Notes (Signed)
Pt called RN into the room and stated that she was having increased pain and wanted to reach out to MD to see if she could have labs drawn prior to leaving just to make sure she was not getting an infection also since her WBC were slightly elevated. MD office paged and left message with secretary who said she would pass it along to MD and his PA.

## 2020-07-12 LAB — AEROBIC CULTURE W GRAM STAIN (SUPERFICIAL SPECIMEN)
Culture: NO GROWTH
Gram Stain: NONE SEEN

## 2020-07-13 ENCOUNTER — Encounter: Payer: Self-pay | Admitting: Internal Medicine

## 2020-07-13 ENCOUNTER — Other Ambulatory Visit: Payer: Self-pay

## 2020-07-13 ENCOUNTER — Ambulatory Visit (INDEPENDENT_AMBULATORY_CARE_PROVIDER_SITE_OTHER): Payer: 59 | Admitting: Internal Medicine

## 2020-07-13 VITALS — BP 126/80 | HR 100 | Ht 69.0 in | Wt 165.6 lb

## 2020-07-13 DIAGNOSIS — G909 Disorder of the autonomic nervous system, unspecified: Secondary | ICD-10-CM | POA: Diagnosis not present

## 2020-07-13 MED ORDER — MIDODRINE HCL 5 MG PO TABS: ORAL_TABLET | ORAL | 3 refills | Status: DC

## 2020-07-13 NOTE — Progress Notes (Signed)
HPI Rachel Griffith returns today for followup. She is a pleasant 35 yo woman with anxiety and neurally mediated syncope. She has been bothered by worsening anxiety. She has undergone microdiscectomy complicated by a small hematoma. She is improving from this. She has not passed out. She notes that on florinef she has had some problem with hypokalemia.  Allergies  Allergen Reactions  . Almond Meal Anaphylaxis  . Cymbalta [Duloxetine Hcl]   . Sulfa Antibiotics Hives  . Trazodone And Nefazodone     Sever body pain   . Sudafed [Pseudoephedrine Hcl] Palpitations     Current Outpatient Medications  Medication Sig Dispense Refill  . albuterol (VENTOLIN HFA) 108 (90 Base) MCG/ACT inhaler Inhale 2 puffs into the lungs every 4 (four) hours as needed for wheezing or shortness of breath.    . ALPRAZolam (XANAX) 0.5 MG tablet Take 0.5 mg by mouth at bedtime.    Marland Kitchen amphetamine-dextroamphetamine (ADDERALL) 20 MG tablet Take 20 mg by mouth 2 (two) times daily.    . APRI 0.15-30 MG-MCG tablet TAKE 1 TABLET BY MOUTH EVERY DAY (Patient taking differently: Take 1 tablet by mouth daily.) 28 tablet 13  . cyclobenzaprine (FLEXERIL) 10 MG tablet Take 1 tablet (10 mg total) by mouth 2 (two) times daily as needed for muscle spasms. 20 tablet 0  . fludrocortisone (FLORINEF) 0.1 MG tablet Take 1 tablet (0.1 mg total) by mouth 2 (two) times daily. 60 tablet 11  . gabapentin (NEURONTIN) 600 MG tablet Take 600-1,800 mg by mouth See admin instructions. 600 mg bid, 1800 mg at bedtime    . HYDROcodone-acetaminophen (NORCO/VICODIN) 5-325 MG tablet Take 1 tablet by mouth every 4 (four) hours as needed for moderate pain. 30 tablet 0  . ibuprofen (ADVIL) 200 MG tablet Take 800 mg by mouth every 6 (six) hours as needed for mild pain or moderate pain.    Marland Kitchen levocetirizine (XYZAL) 5 MG tablet Take 5 mg by mouth daily.    . ondansetron (ZOFRAN ODT) 4 MG disintegrating tablet Take 1 tablet (4 mg total) by mouth every 8 (eight)  hours as needed for nausea or vomiting. 30 tablet 0  . polyethylene glycol powder (GLYCOLAX/MIRALAX) 17 GM/SCOOP powder Take 17 g by mouth daily as needed for mild constipation.    . vortioxetine HBr (TRINTELLIX) 10 MG TABS tablet Take 10 mg by mouth daily.    . potassium chloride 20 MEQ TBCR Take 10 mEq by mouth 2 (two) times daily. (Patient not taking: No sig reported) 10 tablet 1   No current facility-administered medications for this visit.     Past Medical History:  Diagnosis Date  . Anxiety   . Back injury   . Chronic pelvic pain in female   . Endometriosis   . Endometriosis determined by laparoscopy 10/06/2014  . Ovarian cyst   . Pelvic pain in female 10/06/2014  . PONV (postoperative nausea and vomiting)    pt states that with 1st arthroscopy she "passed out and couldnt left her head" but they tell me it was a combination of the pain meds and anesthesia" No problems with next surgery. Not sure of meds given in anesthesia.  Marland Kitchen POTS (postural orthostatic tachycardia syndrome)    pt reports this was diagnosed a couple of months ago by PCP (Dr Annia Friendly)  . PTSD (post-traumatic stress disorder)     ROS:   All systems reviewed and negative except as noted in the HPI.   Past Surgical History:  Procedure  Laterality Date  . ABDOMINAL HYSTERECTOMY    . endometrosis  2014   fulgeration-[facility]  . KNEE SURGERY Bilateral    x2  . LAPAROSCOPIC BILATERAL SALPINGO OOPHERECTOMY Left 07/31/2018   Procedure: LAPAROSCOPIC LEFT SALPINGO OOPHORECTOMY;  Surgeon: Lazaro Arms, MD;  Location: [facility];  Service: Gynecology;  Laterality: Left;  . LAPAROSCOPY N/A 11/26/2014   Procedure: DIAGNOSTIC LAPAROSCOPY;  Surgeon: [name], MD;  Location: [facility];  Service: Gynecology;  Laterality: N/A;  . MICRODISCECTOMY LUMBAR  07/01/2020     Family History  Problem Relation Age of Onset  . Cancer Father        prostate  . Other Sister        IBS  . Other Brother        Lyme Disease  . Dementia  Maternal Grandmother   . Parkinson's disease Maternal Grandfather   . Thrombocytopenia Sister      Social History   Socioeconomic History  . Marital status: Divorced    Spouse name: Not on file  . Number of children: Not on file  . Years of education: Not on file  . Highest education level: Not on file  Occupational History  . Not on file  Tobacco Use  . Smoking status: Former Smoker    Packs/day: 0.50    Types: Cigarettes, E-cigarettes    Quit date: 02/28/2019    Years since quitting: 1.3  . Smokeless tobacco: Never Used  Vaping Use  . Vaping Use: Former  Substance and Sexual Activity  . Alcohol use: Yes    Comment: occ  . Drug use: No  . Sexual activity: Not Currently    Birth control/protection: Surgical  Other Topics Concern  . Not on file  Social History Narrative  . Not on file   Social Determinants of Health   Financial Resource Strain: Not on file  Food Insecurity: Not on file  Transportation Needs: Not on file  Physical Activity: Not on file  Stress: Not on file  Social Connections: Not on file  Intimate Partner Violence: Not on file     BP 126/80   Pulse 100   Ht 5\' 9"  (1.753 m)   Wt 165 lb 9.6 oz (75.1 kg)   LMP 09/19/2014 Comment: post LAVH  SpO2 97%   BMI 24.45 kg/m   Physical Exam:  Well appearing NAD HEENT: Unremarkable Neck:  No JVD, no thyromegally Lymphatics:  No adenopathy Back:  No CVA tenderness Lungs:  Clear HEART:  Regular rate rhythm, no murmurs, no rubs, no clicks Abd:  soft, positive bowel sounds, no organomegally, no rebound, no guarding Ext:  2 plus pulses, no edema, no cyanosis, no clubbing Skin:  No rashes no nodules Neuro:  CN II through XII intact, motor grossly intact    Assess/Plan: 1. Autonomic dysfunction - she is doing well. She has not had syncope. I asked her to use midodrine prn if her symptoms return when she is off of the florinef. 2. Hypertension - she has periods where her pressure is elevated.  Hopefully stopping the florinef will help.  3. Anxiety -I think that this is her biggest problem. I asked her to seek professional help and start walking.  4. Hypokalemia - this should resolve with stopping the florinef. I asked her to wean off of the florinef.  09/21/2014 Jo Booze,MD

## 2020-07-13 NOTE — Patient Instructions (Signed)
Medication Instructions:  Your physician has recommended you make the following change in your medication:  1. WEAN off your Florinef --- take 1/2 tablet TWICE daily for 2 weeks, then reduce and take 1/2 tablet ONCE daily for 2 weeks, then stop it   2. TAKE Midodrine 5 mg daily as needed (do    Do not take later than 6pm.  Avoid after the evening meal or within 4 hours of bedtime.   *If you need a refill on your cardiac medications before your next appointment, please call your pharmacy*   Lab Work: None ordered   Testing/Procedures: None ordered   Follow-Up: At [facility], you and your health needs are our priority.  As part of our continuing mission to provide you with exceptional heart care, we have created designated Provider Care Teams.  These Care Teams include your primary Cardiologist (physician) and Advanced Practice Providers (APPs -  Physician Assistants and Nurse Practitioners) who all work together to provide you with the care you need, when you need it.  Your next appointment:   4 month(s)  The format for your next appointment:   In Person  Provider:   Lewayne Bunting, MD    Thank you for choosing [facility]!!    Other Instructions  Midodrine tablets What is this medicine? MIDODRINE (MI doe dreen) is used to treat low blood pressure in patients who have symptoms like dizziness when going from a sitting to a standing position. This medicine may be used for other purposes; ask your health care provider or pharmacist if you have questions. COMMON BRAND NAME(S): Orvaten, ProAmatine What should I tell my health care provider before I take this medicine? They need to know if you have any of the following conditions:  difficulty passing urine  heart disease  high blood pressure  kidney disease  over active thyroid  pheochromocytoma  an unusual or allergic reaction to midodrine, other medicines, foods, dyes, or preservatives  pregnant or trying  to get pregnant  breast-feeding How should I use this medicine? Take this medicine by mouth with a glass of water. Follow the directions on the prescription label. The last dose of this medicine should not be taken after the evening meal or less than 4 hours before bedtime. When you lie down for any length of time after taking this medicine, high blood pressure can occur. Do not take this medicine if you will be lying down for any length of time. Do not take your medicine more often than directed. Do not stop taking except on your doctor's advice. Talk to your pediatrician regarding the use of this medicine in children. Special care may be needed. Overdosage: If you think you have taken too much of this medicine contact a poison control center or emergency room at once. NOTE: This medicine is only for you. Do not share this medicine with others. What if I miss a dose? If you miss a dose, take it as soon as you can. If it is almost time for your next dose, take only that dose. Do not take double or extra doses. What may interact with this medicine? Do not take this medicine with any of the following medications:  MAOIs like Carbex, Eldepryl, Marplan, Nardil, and Parnate  medicines called ergot alkaloids  medicines for colds and breathing difficulties or weight loss  procarbazine This medicine may also interact with the following medications:  cimetidine  digoxin  flecainide  fludrocortisone  metformin  procainamide  quinidine  ranitidine  triamterene  medicines called alpha-blockers like doxazosin, prazosin, and terazosin This list may not describe all possible interactions. Give your health care provider a list of all the medicines, herbs, non-prescription drugs, or dietary supplements you use. Also tell them if you smoke, drink alcohol, or use illegal drugs. Some items may interact with your medicine. What should I watch for while using this medicine? Visit your doctor or  health care professional for regular checks on your progress. You may get drowsy or dizzy. Do not drive, use machinery, or do anything that needs mental alertness until you know how this medicine affects you. Do not stand or sit up quickly, especially if you are an older patient. This reduces the risk of dizzy or fainting spells. Your mouth may get dry. Chewing sugarless gum or sucking hard candy, and drinking plenty of water may help. Contact your doctor if the problem does not go away or is severe. Do not treat yourself for coughs, colds, or pain while you are taking this medicine without asking your doctor or health care professional for advice. Some ingredients may increase your blood pressure. What side effects may I notice from receiving this medicine? Side effects that you should report to your doctor or health care professional as soon as possible:  awareness of heart beating  blurred vision  headache  irregular heartbeat, palpitations, or chest pain  pounding in the ears  skin rash, hives Side effects that usually do not require medical attention (report to your doctor or health care professional if they continue or are bothersome):  change in heart rate  chills  goose bumps  increased need to urinate  itching  stomach pain  tingling in the skin or scalp This list may not describe all possible side effects. Call your doctor for medical advice about side effects. You may report side effects to FDA at 1-800-FDA-1088. Where should I keep my medicine? Keep out of the reach of children. Store at room temperature between 15 and 30 degrees C (59 and 86 degrees F). Throw away any unused medicine after the expiration date. NOTE: This sheet is a summary. It may not cover all possible information. If you have questions about this medicine, talk to your doctor, pharmacist, or health care provider.  2021 Elsevier/Gold Standard (2007-09-01 13:51:24)

## 2020-07-14 LAB — CULTURE, BLOOD (ROUTINE X 2)
Culture: NO GROWTH
Culture: NO GROWTH
Special Requests: ADEQUATE
Special Requests: ADEQUATE

## 2020-07-14 MED FILL — Fentanyl Citrate Preservative Free (PF) Inj 100 MCG/2ML: INTRAMUSCULAR | Qty: 2 | Status: AC

## 2020-09-02 ENCOUNTER — Ambulatory Visit
Admission: EM | Admit: 2020-09-02 | Discharge: 2020-09-02 | Disposition: A | Discharge status: Home / Self Care | Payer: 59 | Attending: Emergency Medicine | Admitting: Emergency Medicine

## 2020-09-02 ENCOUNTER — Other Ambulatory Visit: Payer: Self-pay

## 2020-09-02 DIAGNOSIS — R0981 Nasal congestion: Secondary | ICD-10-CM | POA: Diagnosis not present

## 2020-09-02 MED ORDER — AMOXICILLIN-POT CLAVULANATE 875-125 MG PO TABS: 1.0000 | ORAL_TABLET | Freq: Two times a day (BID) | ORAL | 0 refills | Status: AC

## 2020-09-02 NOTE — ED Provider Notes (Signed)
[facility]   [id number] 09/02/20 Arrival Time: 2831   CC: sinus congestion   SUBJECTIVE: History from: patient.  STEHANIE EKSTROM is a 35 y.o. female who presents with sinus congestion, pain/ pressure x 9  days ago.  Denies sick exposure to COVID, flu or strep.  Has tried course of doxycycline with minimal relief.  Denies aggravating factors.  Reports previous symptoms in the past.   Denies fever, chills, fatigue, sinus pain, rhinorrhea, sore throat, SOB, wheezing, chest pain, nausea, changes in bowel or bladder habits.     ROS: As per HPI.  All other pertinent ROS negative.     Past Medical History:  Diagnosis Date   Anxiety    Back injury    Chronic pelvic pain in female    Endometriosis    Endometriosis determined by laparoscopy 10/06/2014   Ovarian cyst    Pelvic pain in female 10/06/2014   PONV (postoperative nausea and vomiting)    pt states that with 1st arthroscopy she "passed out and couldnt left her head" but they tell me it was a combination of the pain meds and anesthesia" No problems with next surgery. Not sure of meds given in anesthesia.   POTS (postural orthostatic tachycardia syndrome)    pt reports this was diagnosed a couple of months ago by PCP (Dr Annia Friendly)   PTSD (post-traumatic stress disorder)    Past Surgical History:  Procedure Laterality Date   ABDOMINAL HYSTERECTOMY     endometrosis  2014   fulgeration-[facility]   KNEE SURGERY Bilateral    x2   LAPAROSCOPIC BILATERAL SALPINGO OOPHERECTOMY Left 07/31/2018   Procedure: LAPAROSCOPIC LEFT SALPINGO OOPHORECTOMY;  Surgeon: Lazaro Arms, MD;  Location: [facility];  Service: Gynecology;  Laterality: Left;   LAPAROSCOPY N/A 11/26/2014   Procedure: DIAGNOSTIC LAPAROSCOPY;  Surgeon: [name], MD;  Location: [facility];  Service: Gynecology;  Laterality: N/A;   MICRODISCECTOMY LUMBAR  07/01/2020   Allergies  Allergen Reactions   Almond Meal Anaphylaxis   Cymbalta [Duloxetine Hcl]    Sulfa  Antibiotics Hives   Trazodone And Nefazodone     Sever body pain    Sudafed [Pseudoephedrine Hcl] Palpitations   No current facility-administered medications on file prior to encounter.   Current Outpatient Medications on File Prior to Encounter  Medication Sig Dispense Refill   albuterol (VENTOLIN HFA) 108 (90 Base) MCG/ACT inhaler Inhale 2 puffs into the lungs every 4 (four) hours as needed for wheezing or shortness of breath.     ALPRAZolam (XANAX) 0.5 MG tablet Take 0.5 mg by mouth at bedtime.     amphetamine-dextroamphetamine (ADDERALL) 20 MG tablet Take 20 mg by mouth 2 (two) times daily.     APRI 0.15-30 MG-MCG tablet TAKE 1 TABLET BY MOUTH EVERY DAY (Patient taking differently: Take 1 tablet by mouth daily.) 28 tablet 13   cyclobenzaprine (FLEXERIL) 10 MG tablet Take 1 tablet (10 mg total) by mouth 2 (two) times daily as needed for muscle spasms. 20 tablet 0   gabapentin (NEURONTIN) 600 MG tablet Take 600-1,800 mg by mouth See admin instructions. 600 mg bid, 1800 mg at bedtime     HYDROcodone-acetaminophen (NORCO/VICODIN) 5-325 MG tablet Take 1 tablet by mouth every 4 (four) hours as needed for moderate pain. 30 tablet 0   ibuprofen (ADVIL) 200 MG tablet Take 800 mg by mouth every 6 (six) hours as needed for mild pain or moderate pain.     levocetirizine (XYZAL) 5 MG tablet Take 5  mg by mouth daily.     midodrine (PROAMATINE) 5 MG tablet Take as directed 60 tablet 3   ondansetron (ZOFRAN ODT) 4 MG disintegrating tablet Take 1 tablet (4 mg total) by mouth every 8 (eight) hours as needed for nausea or vomiting. 30 tablet 0   polyethylene glycol powder (GLYCOLAX/MIRALAX) 17 GM/SCOOP powder Take 17 g by mouth daily as needed for mild constipation.     potassium chloride 20 MEQ TBCR Take 10 mEq by mouth 2 (two) times daily. (Patient not taking: No sig reported) 10 tablet 1   vortioxetine HBr (TRINTELLIX) 10 MG TABS tablet Take 10 mg by mouth daily.     Social History   Socioeconomic  History   Marital status: Divorced    Spouse name: Not on file   Number of children: Not on file   Years of education: Not on file   Highest education level: Not on file  Occupational History   Not on file  Tobacco Use   Smoking status: Former    Packs/day: 0.50    Pack years: 0.00    Types: Cigarettes, E-cigarettes    Quit date: 02/28/2019    Years since quitting: 1.5   Smokeless tobacco: Never  Vaping Use   Vaping Use: Former  Substance and Sexual Activity   Alcohol use: Yes    Comment: occ   Drug use: No   Sexual activity: Not Currently    Birth control/protection: Surgical  Other Topics Concern   Not on file  Social History Narrative   Not on file   Social Determinants of Health   Financial Resource Strain: Not on file  Food Insecurity: Not on file  Transportation Needs: Not on file  Physical Activity: Not on file  Stress: Not on file  Social Connections: Not on file  Intimate Partner Violence: Not on file   Family History  Problem Relation Age of Onset   Cancer Father        prostate   Other Sister        IBS   Other Brother        Lyme Disease   Dementia Maternal Grandmother    Parkinson's disease Maternal Grandfather    Thrombocytopenia Sister     OBJECTIVE:  Vitals:   09/02/20 0930  BP: 112/77  Pulse: 88  Resp: 18  Temp: 98.3 F (36.8 C)  SpO2: 97%     General appearance: alert; appears fatigued, but nontoxic; speaking in full sentences and tolerating own secretions HEENT: NCAT; Ears: EACs clear, TMs pearly gray; Eyes: PERRL.  EOM grossly intact. Nose: nares patent without rhinorrhea, Throat: oropharynx clear, tonsils non erythematous or enlarged, uvula midline  Neck: supple without LAD Lungs: unlabored respirations, symmetrical air entry; cough: absent; no respiratory distress; CTAB Heart: regular rate and rhythm.   Skin: warm and dry Psychological: alert and cooperative; normal mood and affect  ASSESSMENT & PLAN:  1. Sinus congestion      Meds ordered this encounter  Medications   amoxicillin-clavulanate (AUGMENTIN) 875-125 MG tablet    Sig: Take 1 tablet by mouth every 12 (twelve) hours for 10 days.    Dispense:  20 tablet    Refill:  0    Order Specific Question:   Supervising Provider    Answer:   Eustace Moore [[id number]]   Rest and push fluids Augmentin prescribed.  Take as directed and to completion Continue with OTC ibuprofen/tylenol as needed for pain Follow up with PCP as needed Return  or go to the ED if you have any new or worsening symptoms such as fever, chills, worsening sinus pain/pressure, cough, sore throat, chest pain, shortness of breath, abdominal pain, changes in bowel or bladder habits, etc...   Reviewed expectations re: course of current medical issues. Questions answered. Outlined signs and symptoms indicating need for more acute intervention. Patient verbalized understanding.          Alvino Chapel [place], PA-C 09/02/20 [phone]

## 2020-09-02 NOTE — ED Triage Notes (Signed)
Pt presents with nasal congestion for past 2 weeks took course of doxy with no improvement

## 2020-09-02 NOTE — Discharge Instructions (Addendum)
Rest and push fluids Augmentin prescribed.  Take as directed and to completion Continue with OTC ibuprofen/tylenol as needed for pain Follow up with PCP as needed Return or go to the ED if you have any new or worsening symptoms such as fever, chills, worsening sinus pain/pressure, cough, sore throat, chest pain, shortness of breath, abdominal pain, changes in bowel or bladder habits, etc... 

## 2020-09-28 ENCOUNTER — Encounter: Payer: Self-pay | Admitting: Gastroenterology

## 2020-10-10 ENCOUNTER — Other Ambulatory Visit: Payer: Self-pay | Admitting: Internal Medicine

## 2020-10-21 ENCOUNTER — Telehealth: Payer: Self-pay | Admitting: Internal Medicine

## 2020-10-28 ENCOUNTER — Other Ambulatory Visit: Payer: Self-pay | Admitting: Obstetrics & Gynecology

## 2020-10-28 ENCOUNTER — Telehealth: Payer: Self-pay | Admitting: Obstetrics & Gynecology

## 2020-10-28 NOTE — Telephone Encounter (Signed)
Patient wanted to see if Dr Despina Hidden could change her Apri 28 tab to the 21 days as she takes them continuously and doesn't take the last week.

## 2020-10-28 NOTE — Telephone Encounter (Signed)
They are placebo, just go to the next pack when she gets to the last week

## 2020-11-01 NOTE — Telephone Encounter (Signed)
Called pt to relay Dr Forestine Chute msg. Two identifiers used. Pt explained that she was having trouble getting her BC refilled because they were saying it was too early since she skips the placebo pills as directed.  Called the pharmacy and they stated that it was an insurance thing.  Called the pt back and asked her to call her insurance company to see if the prescription needed to be rewritten so she could get her refills in time. Pt confirmed understanding and will let us know.

## 2020-11-01 NOTE — Telephone Encounter (Signed)
This encounter was created in error - please disregard.

## 2020-11-03 NOTE — Telephone Encounter (Signed)
Pt is calling see if this medication has been sent to the pharmacy   Please call [phone]

## 2020-11-03 NOTE — Telephone Encounter (Signed)
Message routed to provider for approval/denial of medication refill. Medication is no longer on pt's medication list. Please advise.    Pt sent message:  Rachel Griffith, Tow to P C[facility] Azuc[facility] Triage (supporting Marinus Maw, MD)   Hi Dr. Ladona Ridgel, I tried to wean off the fludrocortisone like we discussed and use midodrine instead. However, my heart rate was 140s - 160s even with salt tablets and midodrine, which is unsustainable. So I went back to taking fludrocortisone 0.1mg  twice a day, which has me stable again. Could you refill the fludrocortisone for me? Thank you in advance!

## 2020-11-07 ENCOUNTER — Telehealth: Payer: Self-pay | Admitting: Internal Medicine

## 2020-11-07 NOTE — Telephone Encounter (Signed)
Start back florinef 0.1 mg twice daily.

## 2020-11-07 NOTE — Telephone Encounter (Signed)
Spoke with patient who did stop taking the fludrocortisone and started taking the Midodrine. Pt reports that while taking the midodrine she has elevated HR, nausea and dizziness. She states that she was stable on fludrocortisone. Please advise.

## 2020-11-07 NOTE — Telephone Encounter (Signed)
New message    Patient has been out of her medication for a week - she is dizzy and nausated, she can not work without this medication  .     *STAT* If patient is at the pharmacy, call can be transferred to refill team.   1. Which medications need to be refilled? (please list name of each medication and dose if known) fludrocortisone  2. Which pharmacy/location (including street and city if local pharmacy) is medication to be sent to? [facility]  at [facility]   3. Do they need a 30 day or 90 day supply? 90

## 2020-11-08 ENCOUNTER — Other Ambulatory Visit (HOSPITAL_COMMUNITY): Payer: Self-pay

## 2020-11-08 MED ORDER — FLUDROCORTISONE ACETATE 0.1 MG PO TABS
0.1000 mg | ORAL_TABLET | Freq: Every day | ORAL | 12 refills | Status: DC
  Filled 2020-12-26: qty 30, 30d supply, fill #0

## 2020-11-08 NOTE — Telephone Encounter (Signed)
Please refill florinef 0.1 mg po twice daily, one month, with 12 refills. GT

## 2020-11-08 NOTE — Addendum Note (Signed)
Addended by: Leonides Schanz C on: 11/08/2020 07:15 AM   Modules accepted: Orders

## 2020-11-09 ENCOUNTER — Ambulatory Visit: Payer: 59 | Admitting: Gastroenterology

## 2020-11-09 ENCOUNTER — Other Ambulatory Visit (HOSPITAL_COMMUNITY): Payer: Self-pay

## 2020-11-10 ENCOUNTER — Other Ambulatory Visit: Payer: Self-pay

## 2020-11-10 ENCOUNTER — Other Ambulatory Visit (HOSPITAL_COMMUNITY): Payer: Self-pay

## 2020-11-10 MED ORDER — VORTIOXETINE HBR 10 MG PO TABS
ORAL_TABLET | ORAL | 0 refills | Status: AC
  Filled 2020-11-10 – 2021-02-27 (×3): qty 90, 90d supply, fill #0
  Filled 2021-03-08: qty 30, 30d supply, fill #0
  Filled 2021-03-21: qty 90, 90d supply, fill #0

## 2020-11-10 MED ORDER — GABAPENTIN 600 MG PO TABS
ORAL_TABLET | ORAL | 3 refills | Status: DC
  Filled 2020-11-10 – 2020-11-24 (×2): qty 150, 30d supply, fill #0
  Filled 2021-01-10: qty 150, 30d supply, fill #1
  Filled 2021-02-13: qty 150, 30d supply, fill #2

## 2020-11-10 MED ORDER — MIDODRINE HCL 5 MG PO TABS
ORAL_TABLET | ORAL | 2 refills | Status: DC
  Filled 2020-11-10: qty 90, 90d supply, fill #0

## 2020-11-10 MED ORDER — ALPRAZOLAM 0.5 MG PO TABS
ORAL_TABLET | ORAL | 2 refills | Status: AC
  Filled 2020-12-25: qty 30, 30d supply, fill #0

## 2020-11-10 NOTE — Telephone Encounter (Signed)
Pt's medication as sent to pt's pharmacy with directions, which was not on the first Rx. Confirmation received.

## 2020-11-15 ENCOUNTER — Ambulatory Visit: Payer: 59 | Admitting: Internal Medicine

## 2020-11-17 ENCOUNTER — Other Ambulatory Visit (HOSPITAL_COMMUNITY): Payer: Self-pay

## 2020-11-18 ENCOUNTER — Other Ambulatory Visit (HOSPITAL_COMMUNITY): Payer: Self-pay

## 2020-11-21 ENCOUNTER — Other Ambulatory Visit (HOSPITAL_COMMUNITY): Payer: Self-pay

## 2020-11-21 MED ORDER — ALBUTEROL SULFATE HFA 108 (90 BASE) MCG/ACT IN AERS
INHALATION_SPRAY | RESPIRATORY_TRACT | 2 refills | Status: AC
  Filled 2020-11-21: qty 18, 16d supply, fill #0

## 2020-11-21 MED ORDER — AZITHROMYCIN 250 MG PO TABS
ORAL_TABLET | ORAL | 0 refills | Status: DC
  Filled 2020-11-21: qty 6, 5d supply, fill #0

## 2020-11-21 MED ORDER — PREDNISONE 10 MG (21) PO TBPK
ORAL_TABLET | ORAL | 0 refills | Status: DC
  Filled 2020-11-21: qty 21, 6d supply, fill #0

## 2020-11-21 MED ORDER — HYDROCODONE BIT-HOMATROP MBR 5-1.5 MG/5ML PO SOLN
ORAL | 0 refills | Status: DC
  Filled 2020-11-21: qty 120, 4d supply, fill #0

## 2020-11-24 ENCOUNTER — Other Ambulatory Visit (HOSPITAL_COMMUNITY): Payer: Self-pay

## 2020-12-01 ENCOUNTER — Other Ambulatory Visit (HOSPITAL_COMMUNITY): Payer: Self-pay

## 2020-12-26 ENCOUNTER — Other Ambulatory Visit (HOSPITAL_COMMUNITY): Payer: Self-pay

## 2020-12-26 DIAGNOSIS — E785 Hyperlipidemia, unspecified: Secondary | ICD-10-CM | POA: Diagnosis not present

## 2020-12-26 DIAGNOSIS — F909 Attention-deficit hyperactivity disorder, unspecified type: Secondary | ICD-10-CM | POA: Diagnosis not present

## 2020-12-26 MED ORDER — AMPHETAMINE-DEXTROAMPHETAMINE 20 MG PO TABS
20.0000 mg | ORAL_TABLET | Freq: Two times a day (BID) | ORAL | 0 refills | Status: DC
  Filled 2020-12-26: qty 60, 30d supply, fill #0

## 2020-12-27 ENCOUNTER — Other Ambulatory Visit (HOSPITAL_COMMUNITY): Payer: Self-pay

## 2020-12-27 ENCOUNTER — Telehealth: Payer: Self-pay

## 2020-12-27 MED ORDER — FLUDROCORTISONE ACETATE 0.1 MG PO TABS
0.1000 mg | ORAL_TABLET | Freq: Two times a day (BID) | ORAL | 2 refills | Status: DC
  Filled 2020-12-27: qty 180, 90d supply, fill #0

## 2020-12-27 MED ORDER — FLUDROCORTISONE ACETATE 0.1 MG PO TABS: 0.1000 mg | ORAL_TABLET | Freq: Two times a day (BID) | ORAL | 2 refills | Status: DC

## 2020-12-27 NOTE — Telephone Encounter (Signed)
Pt 's medication should've been resent in for BID, not QD as per Dr. Lubertha Basque instructions.

## 2021-01-03 ENCOUNTER — Other Ambulatory Visit (HOSPITAL_COMMUNITY): Payer: Self-pay

## 2021-01-03 ENCOUNTER — Ambulatory Visit: Payer: 59 | Admitting: Internal Medicine

## 2021-01-03 ENCOUNTER — Encounter: Payer: Self-pay | Admitting: Internal Medicine

## 2021-01-03 ENCOUNTER — Other Ambulatory Visit: Payer: Self-pay

## 2021-01-03 VITALS — BP 132/82 | HR 107 | Ht 69.0 in | Wt 157.3 lb

## 2021-01-03 DIAGNOSIS — G909 Disorder of the autonomic nervous system, unspecified: Secondary | ICD-10-CM

## 2021-01-03 MED ORDER — FLUDROCORTISONE ACETATE 0.1 MG PO TABS
0.1000 mg | ORAL_TABLET | Freq: Two times a day (BID) | ORAL | 3 refills | Status: DC
  Filled 2021-01-03 – 2021-03-23 (×2): qty 180, 90d supply, fill #0
  Filled 2021-07-06: qty 180, 90d supply, fill #1
  Filled 2021-10-17: qty 180, 90d supply, fill #2

## 2021-01-03 NOTE — Patient Instructions (Signed)
Medication Instructions:  °Your physician recommends that you continue on your current medications as directed. Please refer to the Current Medication list given to you today. ° °*If you need a refill on your cardiac medications before your next appointment, please call your pharmacy* ° ° °Lab Work: °NONE  ° °If you have labs (blood work) drawn today and your tests are completely normal, you will receive your results only by: °MyChart Message (if you have MyChart) OR °A paper copy in the mail °If you have any lab test that is abnormal or we need to change your treatment, we will call you to review the results. ° ° °Testing/Procedures: °NONE  ° ° °Follow-Up: °At [facility], you and your health needs are our priority.  As part of our continuing mission to provide you with exceptional heart care, we have created designated Provider Care Teams.  These Care Teams include your primary Cardiologist (physician) and Advanced Practice Providers (APPs -  Physician Assistants and Nurse Practitioners) who all work together to provide you with the care you need, when you need it. ° °We recommend signing up for the patient portal called "MyChart".  Sign up information is provided on this After Visit Summary.  MyChart is used to connect with patients for Virtual Visits (Telemedicine).  Patients are able to view lab/test results, encounter notes, upcoming appointments, etc.  Non-urgent messages can be sent to your provider as well.   °To learn more about what you can do with MyChart, go to [email or web address].   ° °Your next appointment:   °2 year(s) ° °The format for your next appointment:   °In Person ° °Provider:   °Gregg Taylor, MD  ° ° °Other Instructions °Thank you for choosing [facility]! °  ° ° °

## 2021-01-03 NOTE — Progress Notes (Signed)
HPI Ms. Osterberg returns today for followup. She is a pleasant 35 yo woman with anxiety and neurally mediated syncope. She has been bothered by worsening anxiety. She has not passed out. She notes that on florinef she has had some problem with leg swelling.  Allergies  Allergen Reactions   Almond Meal Anaphylaxis   Cymbalta [Duloxetine Hcl]    Sulfa Antibiotics Hives   Trazodone And Nefazodone     Sever body pain    Sudafed [Pseudoephedrine Hcl] Palpitations     Current Outpatient Medications  Medication Sig Dispense Refill   albuterol (VENTOLIN HFA) 108 (90 Base) MCG/ACT inhaler Inhale 2 puffs into the lungs every 4 (four) hours as needed for wheezing or shortness of breath.     albuterol (VENTOLIN HFA) 108 (90 Base) MCG/ACT inhaler INHALE 2 PUFFS BY MOUTH EVERY 4 HOURS AS NEEDED 18 g 2   ALPRAZolam (XANAX) 0.5 MG tablet Take 0.5 mg by mouth at bedtime.     ALPRAZolam (XANAX) 0.5 MG tablet Take 1 tablet by mouth every day as needed for anxiety. 30 tablet 2   amphetamine-dextroamphetamine (ADDERALL) 20 MG tablet Take 20 mg by mouth 2 (two) times daily.     amphetamine-dextroamphetamine (ADDERALL) 20 MG tablet TAKE 1 TABLET BY MOUTH TWICE A DAY 60 tablet 0   desogestrel-ethinyl estradiol (APRI) 0.15-30 MG-MCG tablet TAKE 1 TABLET BY MOUTH EVERY DAY 28 tablet 13   fludrocortisone (FLORINEF) 0.1 MG tablet Take 1 tablet (0.1 mg total) by mouth 2 (two) times daily. 180 tablet 2   gabapentin (NEURONTIN) 600 MG tablet Take 600-1,800 mg by mouth See admin instructions. 600 mg bid, 1800 mg at bedtime     gabapentin (NEURONTIN) 600 MG tablet Take 1 tablet by mouth twice daily and 3 tablets at bedtime. 150 tablet 3   ibuprofen (ADVIL) 200 MG tablet Take 800 mg by mouth every 6 (six) hours as needed for mild pain or moderate pain.     levocetirizine (XYZAL) 5 MG tablet Take 5 mg by mouth daily.     ondansetron (ZOFRAN ODT) 4 MG disintegrating tablet Take 1 tablet (4 mg total) by mouth every  8 (eight) hours as needed for nausea or vomiting. 30 tablet 0   polyethylene glycol powder (GLYCOLAX/MIRALAX) 17 GM/SCOOP powder Take 17 g by mouth daily as needed for mild constipation.     vortioxetine HBr (TRINTELLIX) 10 MG TABS tablet Take 10 mg by mouth daily.     vortioxetine HBr (TRINTELLIX) 10 MG TABS tablet take 1 tablet by mouth once daily 90 tablet 0   azithromycin (ZITHROMAX Z-PAK) 250 MG tablet TAKE 2 TABLETS BY MOUTH ONCE ON DAY 1, THEN TAKE 1 TABLET DAILY FOR 4 DAYS (Patient not taking: Reported on 01/03/2021) 6 each 0   cyclobenzaprine (FLEXERIL) 10 MG tablet Take 1 tablet (10 mg total) by mouth 2 (two) times daily as needed for muscle spasms. (Patient not taking: Reported on 01/03/2021) 20 tablet 0   fludrocortisone (FLORINEF) 0.1 MG tablet Take 1 tablet (0.1 mg total) by mouth 2 (two) times daily. (Patient not taking: Reported on 01/03/2021) 180 tablet 2   HYDROcodone-acetaminophen (NORCO/VICODIN) 5-325 MG tablet Take 1 tablet by mouth every 4 (four) hours as needed for moderate pain. (Patient not taking: Reported on 01/03/2021) 30 tablet 0   midodrine (PROAMATINE) 5 MG tablet Take 1 tablet by mouth daily as needed. Do not take later than 6pm. Avoid after the evening meal or within 4 hours of bedtime. (  Patient not taking: Reported on 01/03/2021) 90 tablet 2   potassium chloride 20 MEQ TBCR Take 10 mEq by mouth 2 (two) times daily. (Patient not taking: No sig reported) 10 tablet 1   predniSONE (STERAPRED UNI-PAK 21 TAB) 10 MG (21) TBPK tablet Take 6 tablets by mouth on day 1 then decrease by 1 tablet daily until gone (Patient not taking: Reported on 01/03/2021) 21 each 0   No current facility-administered medications for this visit.     Past Medical History:  Diagnosis Date   Anxiety    Back injury    Chronic pelvic pain in female    Endometriosis    Endometriosis determined by laparoscopy 10/06/2014   Ovarian cyst    Pelvic pain in female 10/06/2014   PONV (postoperative nausea and  vomiting)    pt states that with 1st arthroscopy she "passed out and couldnt left her head" but they tell me it was a combination of the pain meds and anesthesia" No problems with next surgery. Not sure of meds given in anesthesia.   POTS (postural orthostatic tachycardia syndrome)    pt reports this was diagnosed a couple of months ago by PCP (Dr Annia Friendly)   PTSD (post-traumatic stress disorder)     ROS:   All systems reviewed and negative except as noted in the HPI.   Past Surgical History:  Procedure Laterality Date   ABDOMINAL HYSTERECTOMY     endometrosis  2014   fulgeration-[facility]   KNEE SURGERY Bilateral    x2   LAPAROSCOPIC BILATERAL SALPINGO OOPHERECTOMY Left 07/31/2018   Procedure: LAPAROSCOPIC LEFT SALPINGO OOPHORECTOMY;  Surgeon: Lazaro Arms, MD;  Location: [facility];  Service: Gynecology;  Laterality: Left;   LAPAROSCOPY N/A 11/26/2014   Procedure: DIAGNOSTIC LAPAROSCOPY;  Surgeon: [name], MD;  Location: [facility];  Service: Gynecology;  Laterality: N/A;   MICRODISCECTOMY LUMBAR  07/01/2020     Family History  Problem Relation Age of Onset   Cancer Father        prostate   Other Sister        IBS   Other Brother        Lyme Disease   Dementia Maternal Grandmother    Parkinson's disease Maternal Grandfather    Thrombocytopenia Sister      Social History   Socioeconomic History   Marital status: Divorced    Spouse name: Not on file   Number of children: Not on file   Years of education: Not on file   Highest education level: Not on file  Occupational History   Not on file  Tobacco Use   Smoking status: Former    Packs/day: 0.50    Types: Cigarettes, E-cigarettes    Quit date: 02/28/2019    Years since quitting: 1.8   Smokeless tobacco: Never  Vaping Use   Vaping Use: Former  Substance and Sexual Activity   Alcohol use: Yes    Comment: occ   Drug use: No   Sexual activity: Not Currently    Birth control/protection: Surgical  Other Topics  Concern   Not on file  Social History Narrative   Not on file   Social Determinants of Health   Financial Resource Strain: Not on file  Food Insecurity: Not on file  Transportation Needs: Not on file  Physical Activity: Not on file  Stress: Not on file  Social Connections: Not on file  Intimate Partner Violence: Not on file     BP 132/82  Pulse (!) 107   Ht 5\' 9"  (1.753 m)   Wt 157 lb 4.8 oz (71.4 kg)   LMP 09/19/2014 Comment: post LAVH  SpO2 97%   BMI 23.23 kg/m   Physical Exam:  Well appearing NAD HEENT: Unremarkable Neck:  No JVD, no thyromegally Lymphatics:  No adenopathy Back:  No CVA tenderness Lungs:  Clear HEART:  Regular rate rhythm, no murmurs, no rubs, no clicks Abd:  soft, positive bowel sounds, no organomegally, no rebound, no guarding Ext:  2 plus pulses, no edema, no cyanosis, no clubbing Skin:  No rashes no nodules Neuro:  CN II through XII intact, motor grossly intact  Assess/Plan:  Autonomic dysfunction - she is doing well and I have recommended she continue her current meds, avoid caffeine an ETOH and to call 09/21/2014 if her symptoms worsen. Anxiety - her symptoms appear improved. We will follow.  [place] Lilybeth Vien,MD

## 2021-01-05 MED FILL — Desogestrel & Ethinyl Estradiol Tab 0.15 MG-30 MCG: ORAL | 28 days supply | Qty: 28 | Fill #0 | Status: CN

## 2021-01-06 ENCOUNTER — Other Ambulatory Visit (HOSPITAL_COMMUNITY): Payer: Self-pay

## 2021-01-07 ENCOUNTER — Other Ambulatory Visit (HOSPITAL_COMMUNITY): Payer: Self-pay

## 2021-01-10 ENCOUNTER — Other Ambulatory Visit (HOSPITAL_COMMUNITY): Payer: Self-pay

## 2021-01-10 MED FILL — Desogestrel & Ethinyl Estradiol Tab 0.15 MG-30 MCG: ORAL | 21 days supply | Qty: 28 | Fill #0 | Status: CN

## 2021-01-25 ENCOUNTER — Other Ambulatory Visit (HOSPITAL_COMMUNITY): Payer: Self-pay

## 2021-01-25 ENCOUNTER — Other Ambulatory Visit: Payer: Self-pay | Admitting: Obstetrics & Gynecology

## 2021-01-25 MED ORDER — AMPHETAMINE-DEXTROAMPHET ER 30 MG PO CP24
30.0000 mg | ORAL_CAPSULE | Freq: Every day | ORAL | 0 refills | Status: DC
  Filled 2021-01-25: qty 30, 30d supply, fill #0

## 2021-01-26 ENCOUNTER — Other Ambulatory Visit (HOSPITAL_COMMUNITY): Payer: Self-pay

## 2021-01-27 ENCOUNTER — Other Ambulatory Visit (HOSPITAL_COMMUNITY): Payer: Self-pay

## 2021-01-27 ENCOUNTER — Telehealth: Payer: Self-pay | Admitting: Obstetrics & Gynecology

## 2021-01-27 NOTE — Telephone Encounter (Signed)
Patient would like her prescription (apri) to be sent to [facility] and be the 21 day meds.

## 2021-01-31 ENCOUNTER — Other Ambulatory Visit (HOSPITAL_COMMUNITY): Payer: Self-pay

## 2021-01-31 MED ORDER — DESOGESTREL-ETHINYL ESTRADIOL 0.15-30 MG-MCG PO TABS
ORAL_TABLET | ORAL | 4 refills | Status: DC
  Filled 2021-01-31: qty 84, 84d supply, fill #0
  Filled 2021-05-08: qty 84, 84d supply, fill #1
  Filled 2021-08-01: qty 84, 84d supply, fill #2
  Filled 2021-11-09: qty 84, 84d supply, fill #3
  Filled 2022-01-24: qty 84, 84d supply, fill #4

## 2021-01-31 MED FILL — Desogestrel & Ethinyl Estradiol Tab 0.15 MG-30 MCG: ORAL | 84 days supply | Qty: 84 | Fill #0 | Status: CN

## 2021-02-07 ENCOUNTER — Other Ambulatory Visit (HOSPITAL_COMMUNITY): Payer: Self-pay

## 2021-02-10 ENCOUNTER — Other Ambulatory Visit (HOSPITAL_COMMUNITY): Payer: Self-pay

## 2021-02-13 ENCOUNTER — Other Ambulatory Visit (HOSPITAL_COMMUNITY): Payer: Self-pay

## 2021-02-15 ENCOUNTER — Other Ambulatory Visit (HOSPITAL_COMMUNITY): Payer: Self-pay

## 2021-02-15 MED ORDER — ALPRAZOLAM 0.5 MG PO TABS
ORAL_TABLET | ORAL | 2 refills | Status: DC
  Filled 2021-02-15: qty 30, 30d supply, fill #0
  Filled 2021-03-23: qty 30, 30d supply, fill #1
  Filled 2021-04-27: qty 30, 30d supply, fill #2

## 2021-02-16 ENCOUNTER — Other Ambulatory Visit (HOSPITAL_COMMUNITY): Payer: Self-pay

## 2021-02-17 ENCOUNTER — Other Ambulatory Visit (HOSPITAL_COMMUNITY): Payer: Self-pay

## 2021-02-17 MED ORDER — AMPHETAMINE-DEXTROAMPHET ER 30 MG PO CP24
ORAL_CAPSULE | ORAL | 0 refills | Status: DC
  Filled 2021-02-17: qty 30, 30d supply, fill #0

## 2021-02-27 ENCOUNTER — Other Ambulatory Visit (HOSPITAL_COMMUNITY): Payer: Self-pay

## 2021-03-08 ENCOUNTER — Other Ambulatory Visit (HOSPITAL_COMMUNITY): Payer: Self-pay

## 2021-03-20 ENCOUNTER — Other Ambulatory Visit (HOSPITAL_COMMUNITY): Payer: Self-pay

## 2021-03-21 ENCOUNTER — Other Ambulatory Visit (HOSPITAL_COMMUNITY): Payer: Self-pay

## 2021-03-23 ENCOUNTER — Other Ambulatory Visit (HOSPITAL_COMMUNITY): Payer: Self-pay

## 2021-03-23 MED ORDER — GABAPENTIN 600 MG PO TABS
ORAL_TABLET | ORAL | 3 refills | Status: DC
  Filled 2021-03-23: qty 150, 30d supply, fill #0
  Filled 2021-04-27: qty 150, 30d supply, fill #1
  Filled 2021-06-01: qty 150, 30d supply, fill #2
  Filled 2021-07-06: qty 150, 30d supply, fill #3

## 2021-03-28 ENCOUNTER — Other Ambulatory Visit (HOSPITAL_COMMUNITY): Payer: Self-pay

## 2021-03-28 DIAGNOSIS — F909 Attention-deficit hyperactivity disorder, unspecified type: Secondary | ICD-10-CM | POA: Diagnosis not present

## 2021-03-28 MED ORDER — AMPHETAMINE-DEXTROAMPHET ER 30 MG PO CP24
ORAL_CAPSULE | ORAL | 0 refills | Status: DC
  Filled 2021-03-28: qty 30, 30d supply, fill #0

## 2021-03-29 ENCOUNTER — Other Ambulatory Visit (HOSPITAL_COMMUNITY): Payer: Self-pay

## 2021-04-27 ENCOUNTER — Other Ambulatory Visit (HOSPITAL_COMMUNITY): Payer: Self-pay

## 2021-04-29 ENCOUNTER — Ambulatory Visit
Admission: EM | Admit: 2021-04-29 | Discharge: 2021-04-29 | Disposition: A | Discharge status: Home / Self Care | Payer: 59 | Attending: Family Medicine | Admitting: Family Medicine

## 2021-04-29 ENCOUNTER — Other Ambulatory Visit: Payer: Self-pay

## 2021-04-29 DIAGNOSIS — S0502XA Injury of conjunctiva and corneal abrasion without foreign body, left eye, initial encounter: Secondary | ICD-10-CM | POA: Diagnosis not present

## 2021-04-29 MED ORDER — ERYTHROMYCIN 5 MG/GM OP OINT: TOPICAL_OINTMENT | OPHTHALMIC | 0 refills | Status: DC

## 2021-04-29 NOTE — ED Triage Notes (Signed)
Pt reports left eye discomfort, states she scratched the cornea at work with a face mask, 2 days ago. States having light sensitivity.  ?

## 2021-04-29 NOTE — Discharge Instructions (Signed)
?  Have a corneal abrasion.  Use the erythromycin ointment as prescribed. ?If symptoms continue or worsen you will need to follow-up with an ophthalmologist. ?

## 2021-04-29 NOTE — ED Provider Notes (Signed)
[facility]    CSN: [id number] Arrival date & time: 04/29/21  4081      History   Chief Complaint Chief Complaint  Patient presents with   Eye Problem    HPI Rachel Griffith is a 36 y.o. female.   36 year old female presents today with left eye pain, irritation.  This started yesterday after work when she scratched her eye with a mask at work.  Denies any vision problems.  Denies any swelling or drainage   Eye Problem  Past Medical History:  Diagnosis Date   Anxiety    Back injury    Chronic pelvic pain in female    Endometriosis    Endometriosis determined by laparoscopy 10/06/2014   Ovarian cyst    Pelvic pain in female 10/06/2014   PONV (postoperative nausea and vomiting)    pt states that with 1st arthroscopy she "passed out and couldnt left her head" but they tell me it was a combination of the pain meds and anesthesia" No problems with next surgery. Not sure of meds given in anesthesia.   POTS (postural orthostatic tachycardia syndrome)    pt reports this was diagnosed a couple of months ago by PCP (Dr Annia Friendly)   PTSD (post-traumatic stress disorder)     Patient Active Problem List   Diagnosis Date Noted   Headache 07/10/2020   Left ovarian cyst 07/31/2018   Ovarian cyst, bilateral 02/08/2016   Postoperative abdominal pain 11/24/2014   Status post laparoscopic assisted vaginal hysterectomy (LAVH) 11/23/2014   Pelvic pain in female 10/06/2014   Endometriosis determined by laparoscopy 10/06/2014    Past Surgical History:  Procedure Laterality Date   ABDOMINAL HYSTERECTOMY     endometrosis  2014   fulgeration-[facility]   KNEE SURGERY Bilateral    x2   LAPAROSCOPIC BILATERAL SALPINGO OOPHERECTOMY Left 07/31/2018   Procedure: LAPAROSCOPIC LEFT SALPINGO OOPHORECTOMY;  Surgeon: Lazaro Arms, MD;  Location: [facility];  Service: Gynecology;  Laterality: Left;   LAPAROSCOPY N/A 11/26/2014   Procedure: DIAGNOSTIC LAPAROSCOPY;  Surgeon: [name], MD;   Location: [facility];  Service: Gynecology;  Laterality: N/A;   MICRODISCECTOMY LUMBAR  07/01/2020    OB History     Gravida  2   Para  2   Term  2   Preterm      AB      Living  2      SAB      IAB      Ectopic      Multiple      Live Births               Home Medications    Prior to Admission medications   Medication Sig Start Date End Date Taking? Authorizing Provider  erythromycin ophthalmic ointment Place a 1/2 inch ribbon of ointment into the lower eyelid. 04/29/21  Yes Kemal Amores A, NP  albuterol (VENTOLIN HFA) 108 (90 Base) MCG/ACT inhaler Inhale 2 puffs into the lungs every 4 (four) hours as needed for wheezing or shortness of breath. 04/19/19   [provider]  albuterol (VENTOLIN HFA) 108 (90 Base) MCG/ACT inhaler INHALE 2 PUFFS BY MOUTH EVERY 4 HOURS AS NEEDED 11/21/20     ALPRAZolam (XANAX) 0.5 MG tablet Take 0.5 mg by mouth at bedtime.    [provider]  ALPRAZolam Prudy Feeler) 0.5 MG tablet Take 1 tablet by mouth every day as needed for anxiety. 10/05/20   Nita Sells, MD  ALPRAZolam Prudy Feeler) 0.5  MG tablet TAKE 1 TABLET BY MOUTH EVERY DAY AS NEEDED FOR ANXIETY 02/15/21     amphetamine-dextroamphetamine (ADDERALL XR) 30 MG 24 hr capsule Take 1 capsule by mouth every day 03/28/21     amphetamine-dextroamphetamine (ADDERALL) 20 MG tablet Take 20 mg by mouth 2 (two) times daily.    [provider]  amphetamine-dextroamphetamine (ADDERALL) 20 MG tablet TAKE 1 TABLET BY MOUTH TWICE A DAY 12/26/20     desogestrel-ethinyl estradiol (CYRED EQ) 0.15-30 MG-MCG tablet Take 1 tablet by mouth daily 01/25/21   Lazaro Arms, MD  fludrocortisone (FLORINEF) 0.1 MG tablet Take 1 tablet by mouth 2  times daily. 01/03/21   Marinus Maw, MD  gabapentin (NEURONTIN) 600 MG tablet Take 600-1,800 mg by mouth See admin instructions. 600 mg bid, 1800 mg at bedtime    [provider]  gabapentin (NEURONTIN) 600 MG tablet Take 1 tablet by mouth twice  daily and 3 tablets at bedtime. 03/23/21     ibuprofen (ADVIL) 200 MG tablet Take 800 mg by mouth every 6 (six) hours as needed for mild pain or moderate pain.    [provider]  levocetirizine (XYZAL) 5 MG tablet Take 5 mg by mouth daily.    [provider]  midodrine (PROAMATINE) 5 MG tablet Take 1 tablet by mouth daily as needed. Do not take later than 6pm. Avoid after the evening meal or within 4 hours of bedtime. Patient not taking: Reported on 01/03/2021 11/10/20   Marinus Maw, MD  ondansetron (ZOFRAN ODT) 4 MG disintegrating tablet Take 1 tablet (4 mg total) by mouth every 8 (eight) hours as needed for nausea or vomiting. 05/31/19   Burgess Amor, PA-C  polyethylene glycol powder (GLYCOLAX/MIRALAX) 17 GM/SCOOP powder Take 17 g by mouth daily as needed for mild constipation.    [provider]  potassium chloride 20 MEQ TBCR Take 10 mEq by mouth 2 (two) times daily. Patient not taking: No sig reported 05/17/20   Mancel Bale, MD  vortioxetine HBr (TRINTELLIX) 10 MG TABS tablet Take 10 mg by mouth daily.    [provider]  vortioxetine HBr (TRINTELLIX) 10 MG TABS tablet Take 1 tablet by mouth once daily 10/04/20       Family History Family History  Problem Relation Age of Onset   Cancer Father        prostate   Other Sister        IBS   Other Brother        Lyme Disease   Dementia Maternal Grandmother    Parkinson's disease Maternal Grandfather    Thrombocytopenia Sister     Social History Social History   Tobacco Use   Smoking status: Former    Packs/day: 0.50    Types: Cigarettes, E-cigarettes    Quit date: 02/28/2019    Years since quitting: 2.1   Smokeless tobacco: Never  Vaping Use   Vaping Use: Former  Substance Use Topics   Alcohol use: Yes    Comment: occ   Drug use: No     Allergies   Almond meal, Cymbalta [duloxetine hcl], Sulfa antibiotics, Trazodone and nefazodone, and Sudafed [pseudoephedrine hcl]   Review of  Systems Review of Systems  Per HPI  Triage Vital Signs ED Triage Vitals  Enc Vitals Group     BP 04/29/21 0934 (!) 181/100     Pulse Rate 04/29/21 0934 73     Resp 04/29/21 0934 18     Temp 04/29/21 0934  98.2 F (36.8 C)     Temp Source 04/29/21 0934 Oral     SpO2 04/29/21 0934 98 %     Weight --      Height --      Head Circumference --      Peak Flow --      Pain Score 04/29/21 0932 4     Pain Loc --      Pain Edu? --      Excl. in GC? --    No data found.  Updated Vital Signs BP (!) 170/102 (BP Location: Right Arm)    Pulse 73    Temp 98.2 F (36.8 C) (Oral)    Resp 18    LMP 09/19/2014 Comment: post LAVH   SpO2 98%   Visual Acuity Right Eye Distance: 20/25 (Without correction) Left Eye Distance: 20/25 (Without correction) Bilateral Distance: 20/25 (Without correction)  Right Eye Near:   Left Eye Near:    Bilateral Near:     Physical Exam Vitals and nursing note reviewed.  Constitutional:      General: She is not in acute distress.    Appearance: Normal appearance. She is not ill-appearing, toxic-appearing or diaphoretic.  Eyes:     Pupils: Pupils are equal, round, and reactive to light.     Comments: Mild left scleral injection. Corneal abrasion noted at 3:00 on cornea with fluorescein exam  Neurological:     Mental Status: She is alert.     UC Treatments / Results  Labs (all labs ordered are listed, but only abnormal results are displayed) Labs Reviewed - No data to display  EKG   Radiology No results found.  Procedures Procedures (including critical care time)  Medications Ordered in UC Medications - No data to display  Initial Impression / Assessment and Plan / UC Course  I have reviewed the triage vital signs and the nursing notes.  Pertinent labs & imaging results that were available during my care of the patient were reviewed by me and considered in my medical decision making (see chart for details).     Corneal abrasion noted at  3:00 on fluorescein exam Treating with erythromycin ointment. Eye patch as needed at work due to sensitivity to light. F/U as needed  Final Clinical Impressions(s) / UC Diagnoses   Final diagnoses:  Abrasion of left cornea, initial encounter     Discharge Instructions       Have a corneal abrasion.  Use the erythromycin ointment as prescribed. If symptoms continue or worsen you will need to follow-up with an ophthalmologist.    ED Prescriptions     Medication Sig Dispense Auth. Provider   erythromycin ophthalmic ointment Place a 1/2 inch ribbon of ointment into the lower eyelid. 3.5 g Dahlia Byes A, NP      PDMP not reviewed this encounter.   Dahlia Byes A, NP 04/29/21 1015

## 2021-05-08 ENCOUNTER — Emergency Department (HOSPITAL_COMMUNITY): Payer: 59

## 2021-05-08 ENCOUNTER — Other Ambulatory Visit: Payer: Self-pay

## 2021-05-08 ENCOUNTER — Other Ambulatory Visit (HOSPITAL_COMMUNITY): Payer: Self-pay

## 2021-05-08 ENCOUNTER — Emergency Department (HOSPITAL_COMMUNITY)
Admission: EM | Admit: 2021-05-08 | Discharge: 2021-05-08 | Disposition: A | Discharge status: Home / Self Care | Payer: 59 | Attending: Emergency Medicine | Admitting: Emergency Medicine

## 2021-05-08 DIAGNOSIS — R519 Headache, unspecified: Secondary | ICD-10-CM | POA: Diagnosis not present

## 2021-05-08 DIAGNOSIS — R110 Nausea: Secondary | ICD-10-CM | POA: Diagnosis not present

## 2021-05-08 DIAGNOSIS — E876 Hypokalemia: Secondary | ICD-10-CM | POA: Insufficient documentation

## 2021-05-08 DIAGNOSIS — Z79899 Other long term (current) drug therapy: Secondary | ICD-10-CM | POA: Diagnosis not present

## 2021-05-08 DIAGNOSIS — I10 Essential (primary) hypertension: Secondary | ICD-10-CM | POA: Insufficient documentation

## 2021-05-08 DIAGNOSIS — R42 Dizziness and giddiness: Secondary | ICD-10-CM | POA: Diagnosis not present

## 2021-05-08 LAB — CBC
HCT: 39 % (ref 36.0–46.0)
Hemoglobin: 13.9 g/dL (ref 12.0–15.0)
MCH: 32.3 pg (ref 26.0–34.0)
MCHC: 35.6 g/dL (ref 30.0–36.0)
MCV: 90.7 fL (ref 80.0–100.0)
Platelets: 252 10*3/uL (ref 150–400)
RBC: 4.3 MIL/uL (ref 3.87–5.11)
RDW: 11.5 % (ref 11.5–15.5)
WBC: 7.6 10*3/uL (ref 4.0–10.5)
nRBC: 0 % (ref 0.0–0.2)

## 2021-05-08 LAB — BASIC METABOLIC PANEL
Anion gap: 10 (ref 5–15)
BUN: 11 mg/dL (ref 6–20)
CO2: 26 mmol/L (ref 22–32)
Calcium: 8.7 mg/dL — ABNORMAL LOW (ref 8.9–10.3)
Chloride: 99 mmol/L (ref 98–111)
Creatinine, Ser: 0.68 mg/dL (ref 0.44–1.00)
GFR, Estimated: >60 mL/min (ref >60)
Glucose, Bld: 97 mg/dL (ref 70–99)
Potassium: 2.3 mmol/L — CL (ref 3.5–5.1)
Sodium: 135 mmol/L (ref 135–145)

## 2021-05-08 LAB — MAGNESIUM: Magnesium: 2.2 mg/dL (ref 1.7–2.4)

## 2021-05-08 IMAGING — CT CT HEAD W/O CM
3 series · 16 of 47 positions shown, 19 images · non-contrast
Comparison: [DATE]

CLINICAL DATA: Headache, new or worsening (Age >= 50y) worsening
severe HA, atypical for patient, over last hr



[Series 3: head wo · axial · 0.44mm/px · z∈[-159,-29]mm · 10 of 32 slices shown, 13 images]
[im 3/32  brain]
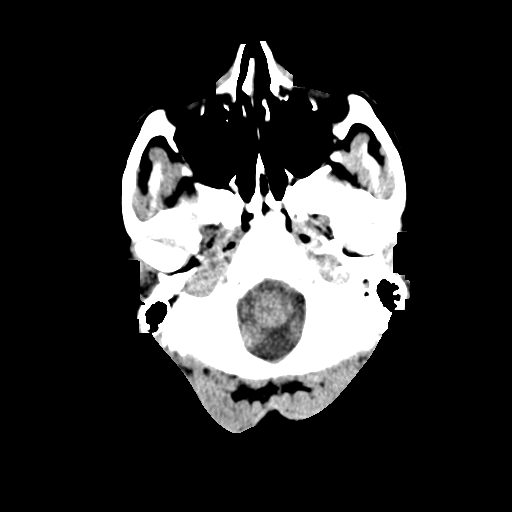
[im 3/32  bone]
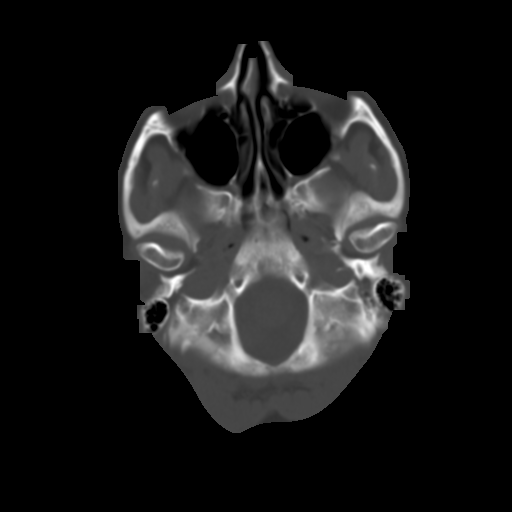
[im 6/32  brain]
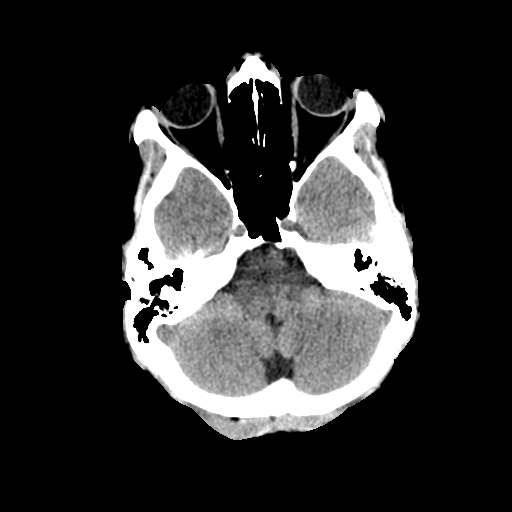
[im 9/32  brain]
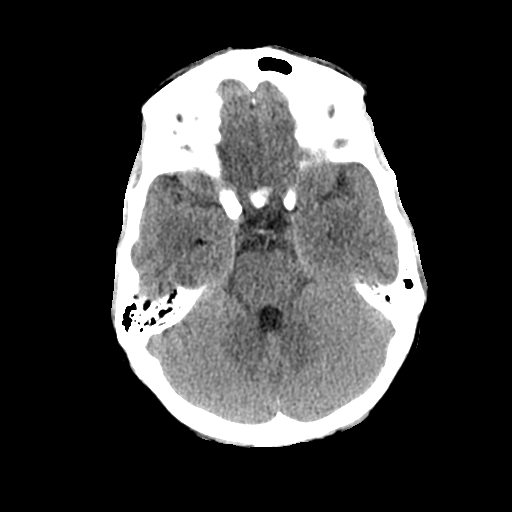
[im 11/32  brain]
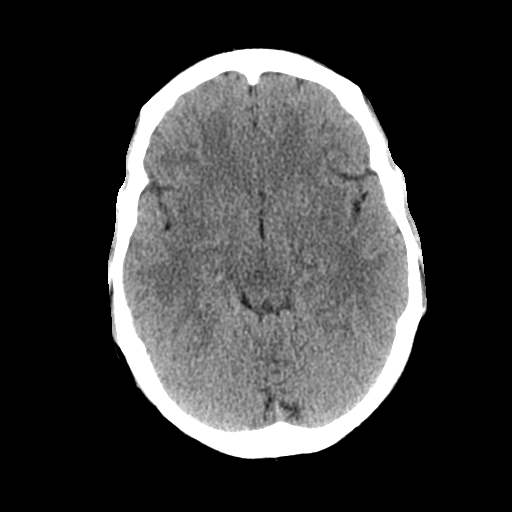
[im 14/32  brain]
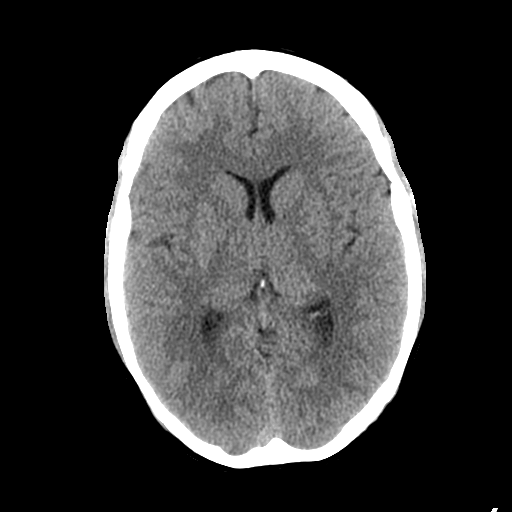
[im 14/32  bone]
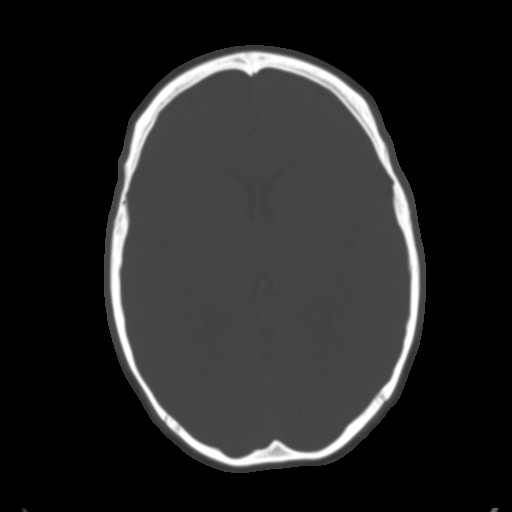
[im 18/32  brain]
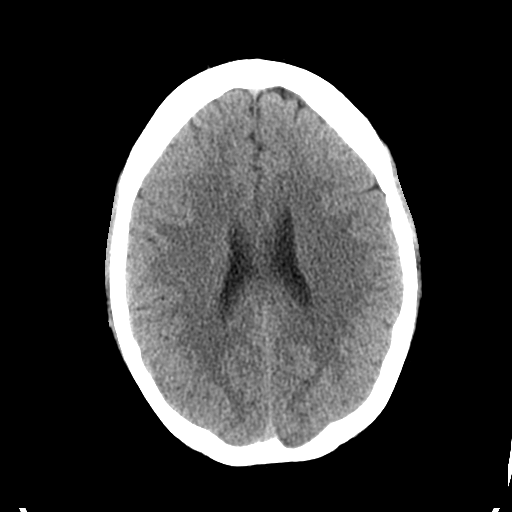
[im 21/32  brain]
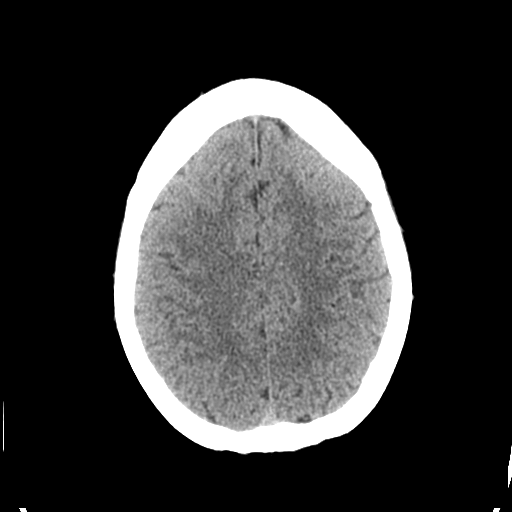
[im 24/32  brain]
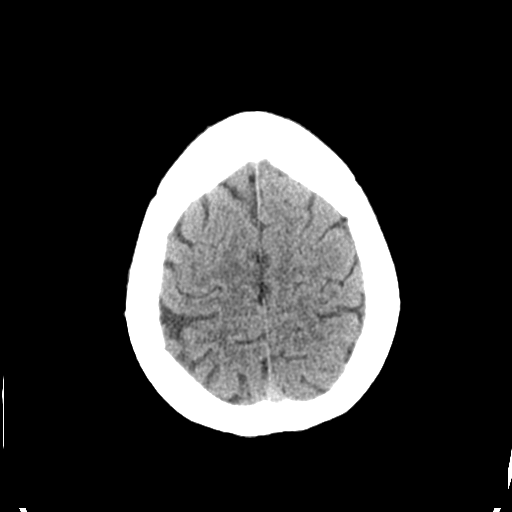
[im 26/32  brain]
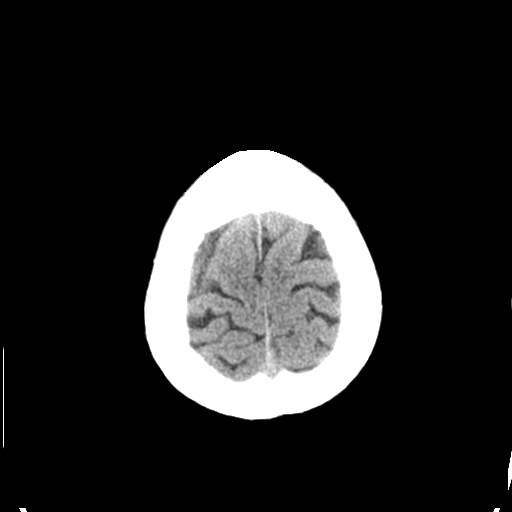
[im 26/32  bone]
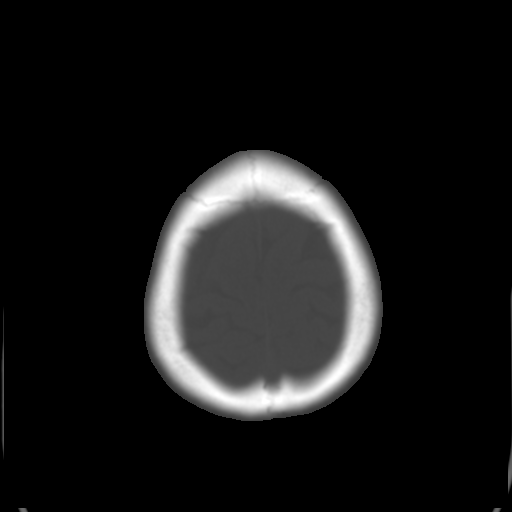
[im 29/32  brain]
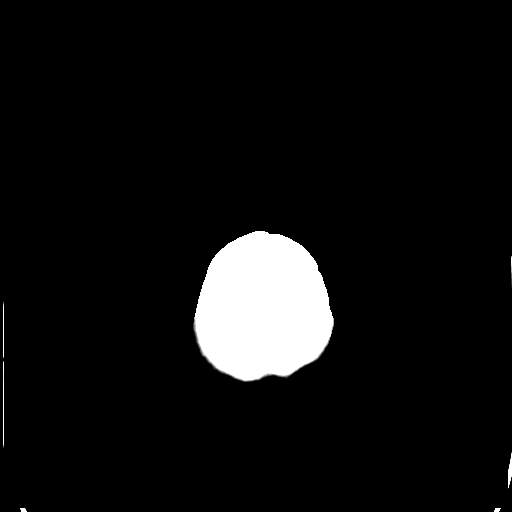

[Series 5: coronal soft tissue · coronal · 0.31mm/px · 3 of 65 slices shown]
[im 22/65  brain]
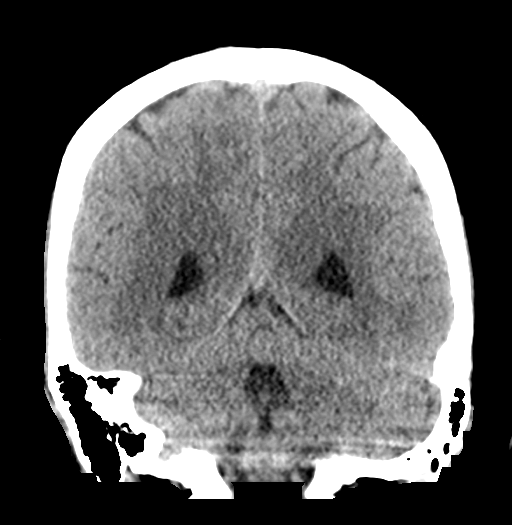
[im 29/65  brain]
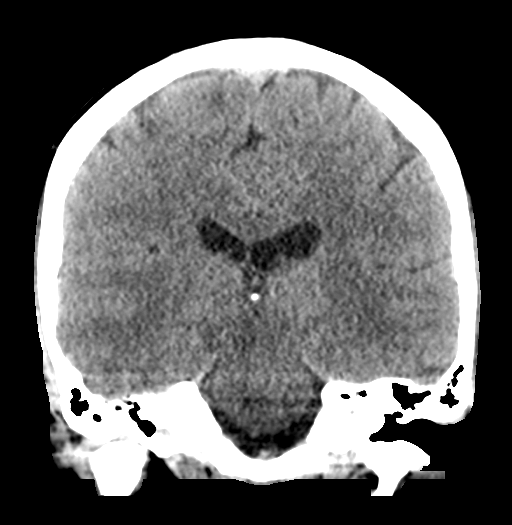
[im 36/65  brain]
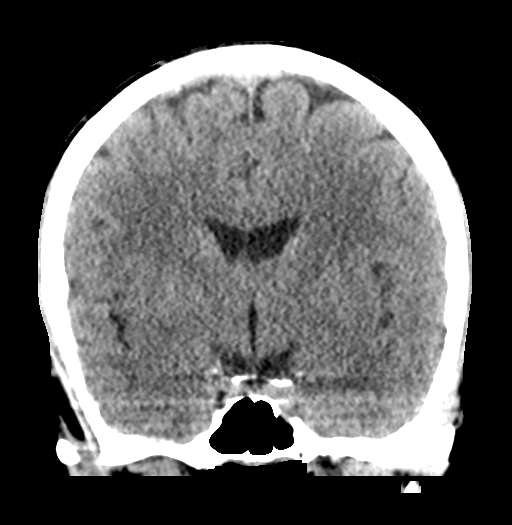

[Series 6: sagittal soft tissue · sagittal · 0.32mm/px · 3 of 54 slices shown]
[im 18/54  brain]
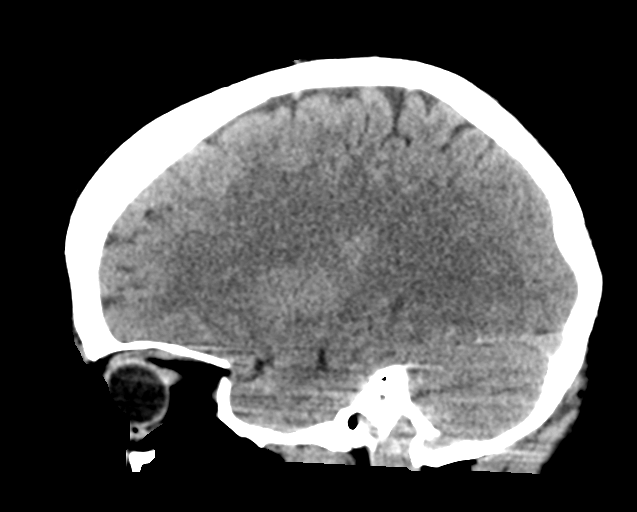
[im 27/54  brain]
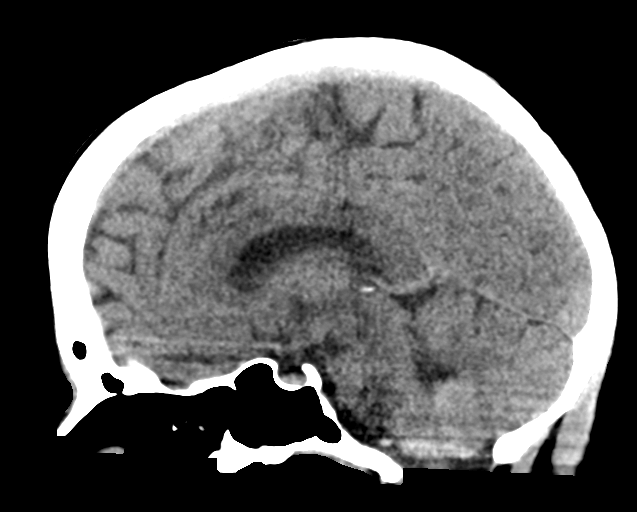
[im 36/54  brain]
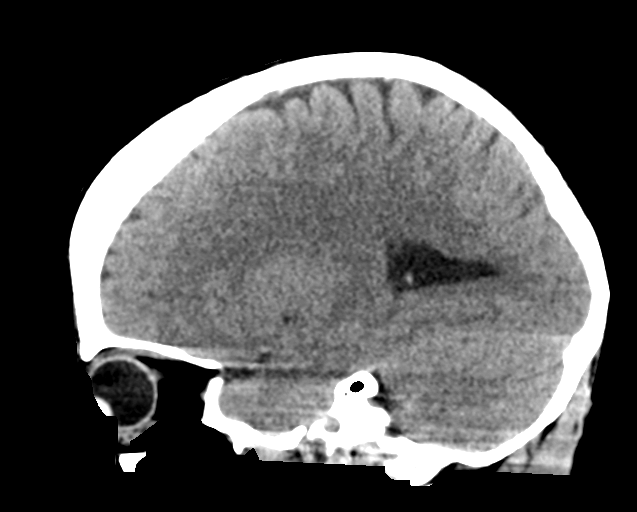

[16 of 47 positions shown; findings below may reference images not displayed]

FINDINGS: Brain: No acute intracranial abnormality. Specifically, no
hemorrhage, hydrocephalus, mass lesion, acute infarction, or
significant intracranial injury.

Vascular: No hyperdense vessel or unexpected calcification.

Skull: No acute calvarial abnormality.

Sinuses/Orbits: No acute findings

Other: None
IMPRESSION: Normal study.

## 2021-05-08 MED ORDER — POTASSIUM CHLORIDE 10 MEQ/100ML IV SOLN
10.0000 meq | INTRAVENOUS | Status: AC
  Administered 2021-05-08 (×3): 10 meq via INTRAVENOUS
  Filled 2021-05-08 (×3): qty 100

## 2021-05-08 MED ORDER — DIPHENHYDRAMINE HCL 50 MG/ML IJ SOLN
25.0000 mg | Freq: Once | INTRAMUSCULAR | Status: AC
  Administered 2021-05-08: 25 mg via INTRAVENOUS
  Filled 2021-05-08: qty 1

## 2021-05-08 MED ORDER — POTASSIUM CHLORIDE ER 20 MEQ PO TBCR
10.0000 meq | EXTENDED_RELEASE_TABLET | Freq: Two times a day (BID) | ORAL | 0 refills | Status: DC
Start: 2021-05-08 — End: 2021-08-02
  Filled 2021-05-08: qty 30, 30d supply, fill #0

## 2021-05-08 MED ORDER — POTASSIUM CHLORIDE CRYS ER 20 MEQ PO TBCR
60.0000 meq | EXTENDED_RELEASE_TABLET | Freq: Once | ORAL | Status: AC
  Administered 2021-05-08: 60 meq via ORAL
  Filled 2021-05-08: qty 3

## 2021-05-08 MED ORDER — METOCLOPRAMIDE HCL 5 MG/ML IJ SOLN
10.0000 mg | Freq: Once | INTRAMUSCULAR | Status: AC
  Administered 2021-05-08: 10 mg via INTRAVENOUS
  Filled 2021-05-08: qty 2

## 2021-05-08 NOTE — ED Triage Notes (Signed)
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 Rating 10/10, frontal, with light sensitivity, and nausea. BP taken by coworker 200/122 (139). No known history of migraines. Per note was seen at [facility] concerning corneal abrasion and noted an elevated BP at that time. NO known hx of HTN.  ?

## 2021-05-08 NOTE — ED Provider Notes (Signed)
[facility]-EMERGENCY DEPT Provider Note   CSN: [id number] Arrival date & time: 05/08/21  0018     History  Chief Complaint  Patient presents with   Headache    Rachel Griffith is a 36 y.o. female.  Patient with history of neurally mediated syncope fludrocortisone --presents to the emergency department for evaluation of headache.  Patient recently had a corneal abrasion due to her mask rubbing her eye at work.  She is an ICU RN.  She has been having mild headaches over the past several days.  She arrived at work tonight at around 7 PM with a minimal headache.  While she was at work, over the past 1 to 2 hours, headache became much more severe.  She describes it as a pressure in the front of her head, that is tender when she presses on the area.  She has associated light sensitivity.  She took ibuprofen prior to arrival.  No vision changes.  She has nausea but no vomiting.  No neck pain or fevers.  She states that when her headache became worse, she decided to check her blood pressure and it was very elevated 200/122.  She does not have a history of migraines and only gets headaches occasionally.  No history of eye surgeries or other problems.  She denies any changes in Adderall dosage recently.      Home Medications Prior to Admission medications   Medication Sig Start Date End Date Taking? Authorizing Provider  albuterol (VENTOLIN HFA) 108 (90 Base) MCG/ACT inhaler Inhale 2 puffs into the lungs every 4 (four) hours as needed for wheezing or shortness of breath. 04/19/19   [provider]  albuterol (VENTOLIN HFA) 108 (90 Base) MCG/ACT inhaler INHALE 2 PUFFS BY MOUTH EVERY 4 HOURS AS NEEDED 11/21/20     ALPRAZolam (XANAX) 0.5 MG tablet Take 0.5 mg by mouth at bedtime.    [provider]  ALPRAZolam Prudy Feeler(XANAX) 0.5 MG tablet Take 1 tablet by mouth every day as needed for anxiety. 10/05/20   Nita SellsHall, John, MD  ALPRAZolam Prudy Feeler(XANAX) 0.5 MG tablet TAKE 1 TABLET BY  MOUTH EVERY DAY AS NEEDED FOR ANXIETY 02/15/21     amphetamine-dextroamphetamine (ADDERALL XR) 30 MG 24 hr capsule Take 1 capsule by mouth every day 03/28/21     amphetamine-dextroamphetamine (ADDERALL) 20 MG tablet Take 20 mg by mouth 2 (two) times daily.    [provider]  amphetamine-dextroamphetamine (ADDERALL) 20 MG tablet TAKE 1 TABLET BY MOUTH TWICE A DAY 12/26/20     desogestrel-ethinyl estradiol (CYRED EQ) 0.15-30 MG-MCG tablet Take 1 tablet by mouth daily 01/25/21   Lazaro ArmsEure, Luther H, MD  erythromycin ophthalmic ointment Place a 1/2 inch ribbon of ointment into the lower eyelid. 04/29/21   Dahlia ByesBast, Traci A, NP  fludrocortisone (FLORINEF) 0.1 MG tablet Take 1 tablet by mouth 2  times daily. 01/03/21   Marinus Mawaylor, Gregg W, MD  gabapentin (NEURONTIN) 600 MG tablet Take 600-1,800 mg by mouth See admin instructions. 600 mg bid, 1800 mg at bedtime    [provider]  gabapentin (NEURONTIN) 600 MG tablet Take 1 tablet by mouth twice daily and 3 tablets at bedtime. 03/23/21     ibuprofen (ADVIL) 200 MG tablet Take 800 mg by mouth every 6 (six) hours as needed for mild pain or moderate pain.    [provider]  levocetirizine (XYZAL) 5 MG tablet Take 5 mg by mouth daily.    [provider]  midodrine (PROAMATINE) 5  MG tablet Take 1 tablet by mouth daily as needed. Do not take later than 6pm. Avoid after the evening meal or within 4 hours of bedtime. Patient not taking: Reported on 01/03/2021 11/10/20   Marinus Maw, MD  ondansetron (ZOFRAN ODT) 4 MG disintegrating tablet Take 1 tablet (4 mg total) by mouth every 8 (eight) hours as needed for nausea or vomiting. 05/31/19   Burgess Amor, PA-C  polyethylene glycol powder (GLYCOLAX/MIRALAX) 17 GM/SCOOP powder Take 17 g by mouth daily as needed for mild constipation.    [provider]  potassium chloride 20 MEQ TBCR Take 10 mEq by mouth 2 (two) times daily. Patient not taking: No sig reported 05/17/20   Mancel Bale, MD   vortioxetine HBr (TRINTELLIX) 10 MG TABS tablet Take 10 mg by mouth daily.    [provider]  vortioxetine HBr (TRINTELLIX) 10 MG TABS tablet Take 1 tablet by mouth once daily 10/04/20         Allergies    Almond meal, Cymbalta [duloxetine hcl], Sulfa antibiotics, Trazodone and nefazodone, and Sudafed [pseudoephedrine hcl]    Review of Systems   Review of Systems  Physical Exam Updated Vital Signs BP (!) 180/95    Pulse 64    Temp 98.1 F (36.7 C) (Oral)    Resp 19    LMP 09/19/2014 Comment: post LAVH   SpO2 100%  Physical Exam Vitals and nursing note reviewed.  Constitutional:      Appearance: She is well-developed.  HENT:     Head: Normocephalic and atraumatic.     Right Ear: Tympanic membrane, ear canal and external ear normal.     Left Ear: Tympanic membrane, ear canal and external ear normal.     Nose: Nose normal.     Mouth/Throat:     Pharynx: Uvula midline.  Eyes:     General: Lids are normal.     Extraocular Movements:     Right eye: No nystagmus.     Left eye: No nystagmus.     Conjunctiva/sclera: Conjunctivae normal.     Pupils: Pupils are equal, round, and reactive to light.  Neck:     Comments: Full active range of motion of neck without discomfort, no signs of meningismus. Cardiovascular:     Rate and Rhythm: Normal rate and regular rhythm.  Pulmonary:     Effort: Pulmonary effort is normal.     Breath sounds: Normal breath sounds.  Abdominal:     Palpations: Abdomen is soft.     Tenderness: There is no abdominal tenderness.  Musculoskeletal:     Cervical back: Normal range of motion and neck supple. No tenderness or bony tenderness.  Skin:    General: Skin is warm and dry.  Neurological:     Mental Status: She is alert and oriented to person, place, and time.     GCS: GCS eye subscore is 4. GCS verbal subscore is 5. GCS motor subscore is 6.     Cranial Nerves: No cranial nerve deficit.     Sensory: No sensory deficit.     Motor: No weakness.      Coordination: Coordination normal.     Gait: Gait normal.     Comments: Upper extremity myotomes tested bilaterally:  C5 Shoulder abduction 5/5 C6 Elbow flexion/wrist extension 5/5 C7 Elbow extension 5/5 C8 Finger flexion 5/5 T1 Finger abduction 5/5  Lower extremity myotomes tested bilaterally: L2 Hip flexion 5/5 L3 Knee extension 5/5 L4 Ankle dorsiflexion 5/5 S1 Ankle  plantar flexion 5/5     ED Results / Procedures / Treatments   Labs (all labs ordered are listed, but only abnormal results are displayed) Labs Reviewed  BASIC METABOLIC PANEL - Abnormal; Notable for the following components:      Result Value   Potassium 2.3 (*)    Calcium 8.7 (*)    All other components within normal limits  CBC  MAGNESIUM    EKG EKG Interpretation  Date/Time:  Monday May 08 2021 01:58:08 EDT Ventricular Rate:  63 PR Interval:  129 QRS Duration: 108 QT Interval:  426 QTC Calculation: 437 R Axis:   62 Text Interpretation: Sinus rhythm Normal ECG U waves present Reconfirmed by Paula Libra ([id number]) on 05/08/2021 2:01:28 AM  Radiology CT HEAD WO CONTRAST ( )  Result Date: 05/08/2021 CLINICAL DATA:  Headache, new or worsening (Age >= 50y) worsening severe HA, atypical for patient, over last hr EXAM: CT HEAD WITHOUT CONTRAST TECHNIQUE: Contiguous axial images were obtained from the base of the skull through the vertex without intravenous contrast. RADIATION DOSE REDUCTION: This exam was performed according to the departmental dose-optimization program which includes automated exposure control, adjustment of the mA and/or kV according to patient size and/or use of iterative reconstruction technique. COMPARISON:  07/10/2020 FINDINGS: Brain: No acute intracranial abnormality. Specifically, no hemorrhage, hydrocephalus, mass lesion, acute infarction, or significant intracranial injury. Vascular: No hyperdense vessel or unexpected calcification. Skull: No acute calvarial abnormality.  Sinuses/Orbits: No acute findings Other: None IMPRESSION: Normal study. Electronically Signed   By: Charlett Nose M.D.   On: 05/08/2021 00:59    Procedures Procedures    Medications Ordered in ED Medications  potassium chloride 10 mEq in 100 mL IVPB (10 mEq Intravenous New Bag/Given 05/08/21 0156)  metoCLOPramide (REGLAN) injection 10 mg (10 mg Intravenous Given 05/08/21 0109)  diphenhydrAMINE (BENADRYL) injection 25 mg (25 mg Intravenous Given 05/08/21 0109)  potassium chloride SA (KLOR-CON M) CR tablet 60 mEq (60 mEq Oral Given 05/08/21 0157)    ED Course/ Medical Decision Making/ A&P    Patient seen and examined. History obtained directly from patient.  Given relatively rapid and worsening of headache, new type of headache for patient, will obtain head CT.  Labs/EKG: Ordered CBC, BMP.  She says that sometimes her blood pressure is elevated when her potassium is high.  Imaging: Ordered CT head without contrast  Medications/Fluids: None ordered.  Most recent vital signs reviewed and are as follows: BP (!) 180/95    Pulse 64    Temp 98.1 F (36.7 C) (Oral)    Resp 19    LMP 09/19/2014 Comment: post LAVH   SpO2 100%   Initial impression: Atypical headache, hypertension  1:05 AM Reassessment performed. Patient appears comfortable, sitting in dark room.  Imaging personally visualized and interpreted including: Head CT, agree no obvious intraparenchymal hemorrhage or other bleeding.  Reviewed pertinent lab work and imaging with patient at bedside. Questions answered.   Most current vital signs reviewed and are as follows: BP (!) 176/99    Pulse 68    Temp 98.1 F (36.7 C) (Oral)    Resp 20    LMP 09/19/2014 Comment: post LAVH   SpO2 100%   Plan: IV Reglan, Benadryl for headache, reassess.  2:04 AM Reassessment performed. Patient appears comfortable.  She states that her headache is feeling better now.  Labs personally reviewed and interpreted including: CBC normal, BMP with  hypokalemia of 2.3.  EKG ordered, reviewed with Dr. Read Drivers, U waves present.  Otherwise no QT changes, normal PR and QRS intervals.  Reviewed pertinent lab work and imaging with patient at bedside. Questions answered.  She is amenable to potassium repletion.  Most current vital signs reviewed and are as follows: BP (!) 160/91    Pulse 70    Temp 98.1 F (36.7 C) (Oral)    Resp 17    LMP 09/19/2014 Comment: post LAVH   SpO2 97%   Plan: Oral potassium 60 mEq p.o., IV potassium 10 mEq x 3.  Will check magnesium level.  In the past, patient's magnesium level has typically been normal.  5:06 AM Reassessment performed. Patient appears well.  She continues to feel much better after treatment of headache.  She has received 3 runs of IV potassium as well as oral potassium.  She is feeling well and would like to go home at this point.  Reviewed pertinent lab work and imaging with patient at bedside. Questions answered.   Most current vital signs reviewed and are as follows: BP (!) 159/95    Pulse 62    Temp 98.1 F (36.7 C) (Oral)    Resp 13    LMP 09/19/2014 Comment: post LAVH   SpO2 97%   Plan: Discharge to home.   Prescriptions written for: Potassium supplementation 20 mEq twice a day  Other home care instructions discussed:   ED return instructions discussed: Return with worsening severe headache, lightheadedness, syncope  Follow-up instructions discussed: Patient encouraged to follow-up with their PCP in 7 days.   Discussed the importance of follow-up for recheck of potassium to ensure that her low potassium is adequately treated and to recheck blood pressure                          Medical Decision Making Amount and/or Complexity of Data Reviewed Labs: ordered. Radiology: ordered.  Risk Prescription drug management.   In regards to the patient's headache, critical differentials were considered including subarachnoid hemorrhage, intracerebral hemorrhage, epidural/subdural  hematoma, pituitary apoplexy, vertebral/carotid artery dissection, giant cell arteritis, central venous thrombosis, reversible cerebral vasoconstriction, acute angle closure glaucoma, idiopathic intracranial hypertension, bacterial meningitis, viral encephalitis, carbon monoxide poisoning, posterior reversible encephalopathy syndrome, pre-eclampsia.   Reg flag symptoms related to these causes were considered including systemic symptoms (fever, weight loss), neurologic symptoms (confusion, mental status change, vision change, associated seizure), acute or sudden "thunderclap" onset, patient age 26 or older with new or progressive headache, patient of any age with first headache or change in headache pattern, pregnant or postpartum status, history of HIV or other immunocompromise, history of cancer, headache occurring with exertion, associated neck or shoulder pain, associated traumatic injury, concurrent use of anticoagulation, family history of spontaneous SAH, and concurrent drug use.    Other benign, more common causes of headache were considered including migraine, tension-type headache, cluster headache, referred pain from other cause such as sinus infection, dental pain, trigeminal neuralgia.   On exam, patient has a reassuring neuro exam including baseline mental status, no significant neck pain or meningeal signs, no signs of severe infection or fever. CT of the head is negative for bleeding or mass.   In regards to her hypokalemia, this is likely related to poor diet in setting of fludrocortisone use.  She has had low potassium in the past and this is very likely a chronic problem for her, exacerbated by not being on a supplement recently.  Potassium was repleted.  EKG was normal other than some U waves  noted.  The patient's vital signs, pertinent lab work and imaging were reviewed and interpreted as discussed in the ED course. Hospitalization was considered for further testing, treatments, or  serial exams/observation. However as patient is well-appearing, has a stable exam over the course of their evaluation, and reassuring studies today, I do not feel that they warrant admission at this time. This plan was discussed with the patient who verbalizes agreement and comfort with this plan and seems reliable and able to return to the Emergency Department with worsening or changing symptoms.          Final Clinical Impression(s) / ED Diagnoses Final diagnoses:  Hypokalemia  Acute nonintractable headache, unspecified headache type    Rx / DC Orders ED Discharge Orders          Ordered    Potassium Chloride ER 20 MEQ TBCR  2 times daily        05/08/21 0505              Renne Crigler, PA-C 05/08/21 0512    Molpus, Jonny Ruiz, MD 05/08/21 [phone]

## 2021-05-08 NOTE — ED Notes (Signed)
Patient transported to CT 

## 2021-05-08 NOTE — Discharge Instructions (Signed)
Please read and follow all provided instructions. ? ?Your diagnoses today include:  ?1. Hypokalemia   ?2. Acute nonintractable headache, unspecified headache type   ? ? ?Tests performed today include: ?CT of your head which was normal and did not show any serious cause of your headache ?Complete blood cell count:  ?Basic metabolic panel: Potassium was low at 2.3, you should have this rechecked by your doctor in 1 week ?Magnesium: Normal ?Vital signs. See below for your results today.  ? ?Medications:  ?In the Emergency Department you received: ?Reglan - antinausea/headache medication ?Benadryl - antihistamine to counteract potential side effects of reglan ? ?Take any prescribed medications only as directed. ? ?Additional information:  ?Follow any educational materials contained in this packet. ? ?You are having a headache. No specific cause was found today for your headache. It may have been a migraine or other cause of headache. Stress, anxiety, fatigue, and depression are common triggers for headaches.  ? ?Your headache today does not appear to be life-threatening or require hospitalization, but often the exact cause of headaches is not determined in the emergency department. Therefore, follow-up with your doctor is very important to find out what may have caused your headache and whether or not you need any further diagnostic testing or treatment.  ? ?Sometimes headaches can appear benign (not harmful), but then more serious symptoms can develop which should prompt an immediate re-evaluation by your doctor or the emergency department. ? ?BE VERY CAREFUL not to take multiple medicines containing Tylenol (also called acetaminophen). Doing so can lead to an overdose which can damage your liver and cause liver failure and possibly death.  ? ?Follow-up instructions: ?Please follow-up with your primary care provider in the next 3 days for further evaluation of your symptoms.  ? ?Return instructions:  ?Please return to  the Emergency Department if you experience worsening symptoms. ?Return if the medications do not resolve your headache, if it recurs, or if you have multiple episodes of vomiting or cannot keep down fluids. ?Return if you have a change from the usual headache. ?RETURN IMMEDIATELY IF you: ?Develop a sudden, severe headache ?Develop confusion or become poorly responsive or faint ?Develop a fever above 100.69F or problem breathing ?Have a change in speech, vision, swallowing, or understanding ?Develop new weakness, numbness, tingling, incoordination in your arms or legs ?Have a seizure ?Please return if you have any other emergent concerns. ? ?Additional Information: ? ?Your vital signs today were: ?BP (!) 159/95   Pulse 62   Temp 98.1 ?F (36.7 ?C) (Oral)   Resp 13   LMP 09/19/2014 Comment: post LAVH  SpO2 97%  ?If your blood pressure (BP) was elevated above 135/85 this visit, please have this repeated by your doctor within one month. ?-------------- ? ?

## 2021-05-15 ENCOUNTER — Ambulatory Visit
Admission: EM | Admit: 2021-05-15 | Discharge: 2021-05-15 | Disposition: A | Discharge status: Home / Self Care | Payer: PRIVATE HEALTH INSURANCE | Attending: Student | Admitting: Student

## 2021-05-15 ENCOUNTER — Other Ambulatory Visit: Payer: Self-pay

## 2021-05-15 DIAGNOSIS — S0502XA Injury of conjunctiva and corneal abrasion without foreign body, left eye, initial encounter: Secondary | ICD-10-CM | POA: Diagnosis not present

## 2021-05-15 MED ORDER — POLYMYXIN B-TRIMETHOPRIM 10000-0.1 UNIT/ML-% OP SOLN: 1.0000 [drp] | OPHTHALMIC | 0 refills | Status: AC

## 2021-05-15 NOTE — ED Provider Notes (Signed)
?[facility] ? ? ? ?CSN: [id number] ?Arrival date & time: 05/15/21  [id number] ? ? ?  ? ?History   ?Chief Complaint ?Chief Complaint  ?Patient presents with  ? Eye Problem  ? ? ?HPI ?Rachel Griffith is a 36 y.o. female presenting following L eye injury sustained at work.  She actually was at our urgent care for L corneal abrasion 2 weeks ago, this had finally healed when she sustained today's eye injury.  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  There is some photophobia, but no foreign body sensation.  Discomfort and tearing.  Denies vision changes, flashes of light or curtains in field of vision, new floaters, eye pain with movement. ? ?HPI ? ?Past Medical History:  ?Diagnosis Date  ? Anxiety   ? Back injury   ? Chronic pelvic pain in female   ? Endometriosis   ? Endometriosis determined by laparoscopy 10/06/2014  ? Ovarian cyst   ? Pelvic pain in female 10/06/2014  ? PONV (postoperative nausea and vomiting)   ? pt states that with 1st arthroscopy she "passed out and couldnt left her head" but they tell me it was a combination of the pain meds and anesthesia" No problems with next surgery. Not sure of meds given in anesthesia.  ? POTS (postural orthostatic tachycardia syndrome)   ? pt reports this was diagnosed a couple of months ago by PCP (Dr Higinio Plan)  ? PTSD (post-traumatic stress disorder)   ? ? ?Patient Active Problem List  ? Diagnosis Date Noted  ? Headache 07/10/2020  ? Left ovarian cyst 07/31/2018  ? Ovarian cyst, bilateral 02/08/2016  ? Postoperative abdominal pain 11/24/2014  ? Status post laparoscopic assisted vaginal hysterectomy (LAVH) 11/23/2014  ? Pelvic pain in female 10/06/2014  ? Endometriosis determined by laparoscopy 10/06/2014  ? ? ?Past Surgical History:  ?Procedure Laterality Date  ? ABDOMINAL HYSTERECTOMY    ? endometrosis  2014  ? fulgeration-[facility]  ? KNEE SURGERY Bilateral   ? x2  ? LAPAROSCOPIC BILATERAL SALPINGO OOPHERECTOMY Left  07/31/2018  ? Procedure: LAPAROSCOPIC LEFT SALPINGO OOPHORECTOMY;  Surgeon: Florian Buff, MD;  Location: [facility];  Service: Gynecology;  Laterality: Left;  ? LAPAROSCOPY N/A 11/26/2014  ? Procedure: DIAGNOSTIC LAPAROSCOPY;  Surgeon: Jonnie Kind, MD;  Location: [facility];  Service: Gynecology;  Laterality: N/A;  ? MICRODISCECTOMY LUMBAR  07/01/2020  ? ? ?OB History   ? ? Gravida  ?2  ? Para  ?2  ? Term  ?2  ? Preterm  ?   ? AB  ?   ? Living  ?2  ?  ? ? SAB  ?   ? IAB  ?   ? Ectopic  ?   ? Multiple  ?   ? Live Births  ?   ?   ?  ?  ? ? ? ?Home Medications   ? ?Prior to Admission medications   ?Medication Sig Start Date End Date Taking? Authorizing Provider  ?trimethoprim-polymyxin b (POLYTRIM) ophthalmic solution Place 1 drop into the left eye every 4 (four) hours for 7 days. 05/15/21 05/22/21 Yes Hazel Sams, PA-C  ?albuterol (VENTOLIN HFA) 108 (90 Base) MCG/ACT inhaler Inhale 2 puffs into the lungs every 4 (four) hours as needed for wheezing or shortness of breath. 04/19/19   [provider]  ?albuterol (VENTOLIN HFA) 108 (90 Base) MCG/ACT inhaler INHALE 2 PUFFS BY MOUTH EVERY 4 HOURS AS  NEEDED 11/21/20     ?ALPRAZolam (XANAX) 0.5 MG tablet Take 0.5 mg by mouth at bedtime.    [provider]  ?ALPRAZolam Duanne Moron) 0.5 MG tablet Take 1 tablet by mouth every day as needed for anxiety. 10/05/20   Allyn Kenner, MD  ?ALPRAZolam Duanne Moron) 0.5 MG tablet TAKE 1 TABLET BY MOUTH EVERY DAY AS NEEDED FOR ANXIETY 02/15/21     ?amphetamine-dextroamphetamine (ADDERALL XR) 30 MG 24 hr capsule Take 1 capsule by mouth every day 03/28/21     ?amphetamine-dextroamphetamine (ADDERALL) 20 MG tablet Take 20 mg by mouth 2 (two) times daily.    [provider]  ?amphetamine-dextroamphetamine (ADDERALL) 20 MG tablet TAKE 1 TABLET BY MOUTH TWICE A DAY 12/26/20     ?desogestrel-ethinyl estradiol (CYRED EQ) 0.15-30 MG-MCG tablet Take 1 tablet by mouth daily 01/25/21   Florian Buff, MD  ?erythromycin ophthalmic ointment Place  a 1/2 inch ribbon of ointment into the lower eyelid. 04/29/21   Loura Halt A, NP  ?fludrocortisone (FLORINEF) 0.1 MG tablet Take 1 tablet by mouth 2  times daily. 01/03/21   Evans Lance, MD  ?gabapentin (NEURONTIN) 600 MG tablet Take 600-1,800 mg by mouth See admin instructions. 600 mg bid, 1800 mg at bedtime    [provider]  ?gabapentin (NEURONTIN) 600 MG tablet Take 1 tablet by mouth twice daily and 3 tablets at bedtime. 03/23/21     ?ibuprofen (ADVIL) 200 MG tablet Take 800 mg by mouth every 6 (six) hours as needed for mild pain or moderate pain.    [provider]  ?levocetirizine (XYZAL) 5 MG tablet Take 5 mg by mouth daily.    [provider]  ?midodrine (PROAMATINE) 5 MG tablet Take 1 tablet by mouth daily as needed. Do not take later than 6pm. Avoid after the evening meal or within 4 hours of bedtime. ?Patient not taking: Reported on 01/03/2021 11/10/20   Evans Lance, MD  ?ondansetron (ZOFRAN ODT) 4 MG disintegrating tablet Take 1 tablet (4 mg total) by mouth every 8 (eight) hours as needed for nausea or vomiting. 05/31/19   Evalee Jefferson, PA-C  ?polyethylene glycol powder (GLYCOLAX/MIRALAX) 17 GM/SCOOP powder Take 17 g by mouth daily as needed for mild constipation.    [provider]  ?Potassium Chloride ER 20 MEQ TBCR Take 1 tablet by mouth 2 times daily. 05/08/21   Carlisle Cater, PA-C  ?vortioxetine HBr (TRINTELLIX) 10 MG TABS tablet Take 10 mg by mouth daily.    [provider]  ?vortioxetine HBr (TRINTELLIX) 10 MG TABS tablet Take 1 tablet by mouth once daily 10/04/20     ? ? ?Family History ?Family History  ?Problem Relation Age of Onset  ? Cancer Father   ?     prostate  ? Other Sister   ?     IBS  ? Other Brother   ?     Lyme Disease  ? Dementia Maternal Grandmother   ? Parkinson's disease Maternal Grandfather   ? Thrombocytopenia Sister   ? ? ?Social History ?Social History  ? ?Tobacco Use  ? Smoking status: Former  ?  Packs/day: 0.50  ?  Types:  Cigarettes, E-cigarettes  ?  Quit date: 02/28/2019  ?  Years since quitting: 2.2  ? Smokeless tobacco: Never  ?Vaping Use  ? Vaping Use: Former  ?Substance Use Topics  ? Alcohol use: Yes  ?  Comment: occ  ? Drug use: No  ? ? ? ?Allergies   ?Almond meal,  Cymbalta [duloxetine hcl], Sulfa antibiotics, Trazodone and nefazodone, and Sudafed [pseudoephedrine hcl] ? ? ?Review of Systems ?Review of Systems  ?Eyes:  Positive for photophobia.  ?All other systems reviewed and are negative. ? ? ?Physical Exam ?Triage Vital Signs ?ED Triage Vitals  ?Enc Vitals Group  ?   BP 05/15/21 0838 (!) 181/80  ?   Pulse Rate 05/15/21 0838 78  ?   Resp 05/15/21 0838 20  ?   Temp 05/15/21 0838 98.6 ?F (37 ?C)  ?   Temp src --   ?   SpO2 05/15/21 0838 99 %  ?   Weight --   ?   Height --   ?   Head Circumference --   ?   Peak Flow --   ?   Pain Score 05/15/21 0836 8  ?   Pain Loc --   ?   Pain Edu? --   ?   Excl. in [place]? --   ? ?No data found. ? ?Updated Vital Signs ?BP (!) 181/80   Pulse 78   Temp 98.6 ?F (37 ?C)   Resp 20   LMP 09/19/2014 Comment: post LAVH  SpO2 99%  ? ?Visual Acuity ?Right Eye Distance:   ?Left Eye Distance:   ?Bilateral Distance:   ? ?Right Eye Near:   ?Left Eye Near:    ?Bilateral Near:    ? ?Physical Exam ?Vitals reviewed.  ?Constitutional:   ?   Appearance: Normal appearance.  ?HENT:  ?   Head: Normocephalic and atraumatic.  ?   Right Ear: Tympanic membrane, ear canal and external ear normal. There is no impacted cerumen.  ?   Left Ear: Tympanic membrane, ear canal and external ear normal. There is no impacted cerumen.  ?   Nose: Nose normal. No congestion.  ?   Mouth/Throat:  ?   Pharynx: Oropharynx is clear. No posterior oropharyngeal erythema.  ?Eyes:  ?   General: Lids are normal. Lids are everted, no foreign bodies appreciated. Vision grossly intact. Gaze aligned appropriately. No visual field deficit.    ?   Right eye: No foreign body, discharge or hordeolum.     ?   Left eye: No foreign body, discharge or  hordeolum.  ?   Extraocular Movements: Extraocular movements intact.  ?   Right eye: Normal extraocular motion and no nystagmus.  ?   Left eye: Normal extraocular motion and no nystagmus.  ?   Conjunctiva/sclera: Conjunc

## 2021-05-15 NOTE — Discharge Instructions (Addendum)
-  You have a corneal abrasion. A corneal abrasion is a superficial scratch on the clear, protective "window" at the front of the eye (cornea). The cornea can be scratched by contact with dust, dirt, sand, wood shavings, plant matter, metal particles, contact lenses or even the edge of a piece of paper. ?-The eye is not infected, but we typically treat with an antibiotic drop to prevent infection. ?-Polytrim drops one drop every 4 hours while you're awake x7 days ?-Warm compresses can help with the discomfort. ?-Call your eye doctor today to schedule a follow-up appointment! ?-If your symptoms are getting worse instead of better, like eye pain, vision changes, eye pain with movement, eyelid swelling-follow-up with an ophthalmologist.  Information below, call them to schedule this. ?-Occupational health can fill out your paperwork and do any absence paperwork you need ?

## 2021-05-15 NOTE — ED Triage Notes (Signed)
Pt presents with left eye injury from being struck with  suction tubing this morning  ?

## 2021-05-25 ENCOUNTER — Other Ambulatory Visit (HOSPITAL_COMMUNITY): Payer: Self-pay

## 2021-06-01 ENCOUNTER — Other Ambulatory Visit (HOSPITAL_COMMUNITY): Payer: Self-pay

## 2021-06-01 MED ORDER — ALPRAZOLAM 0.5 MG PO TABS
ORAL_TABLET | ORAL | 2 refills | Status: DC
  Filled 2021-06-01: qty 30, 30d supply, fill #0
  Filled 2021-07-06: qty 30, 30d supply, fill #1
  Filled 2021-08-10: qty 30, 30d supply, fill #2

## 2021-06-01 MED ORDER — AMPHETAMINE-DEXTROAMPHET ER 30 MG PO CP24
ORAL_CAPSULE | ORAL | 0 refills | Status: DC
  Filled 2021-06-01: qty 30, 30d supply, fill #0

## 2021-06-07 ENCOUNTER — Other Ambulatory Visit (HOSPITAL_COMMUNITY): Payer: Self-pay

## 2021-06-07 DIAGNOSIS — H04123 Dry eye syndrome of bilateral lacrimal glands: Secondary | ICD-10-CM | POA: Diagnosis not present

## 2021-06-07 DIAGNOSIS — H0288A Meibomian gland dysfunction right eye, upper and lower eyelids: Secondary | ICD-10-CM | POA: Diagnosis not present

## 2021-06-07 DIAGNOSIS — H0288B Meibomian gland dysfunction left eye, upper and lower eyelids: Secondary | ICD-10-CM | POA: Diagnosis not present

## 2021-06-07 MED ORDER — PREDNISOLONE ACETATE 1 % OP SUSP
OPHTHALMIC | 0 refills | Status: DC
  Filled 2021-06-07: qty 5, 30d supply, fill #0

## 2021-06-22 ENCOUNTER — Other Ambulatory Visit (HOSPITAL_COMMUNITY): Payer: Self-pay

## 2021-06-22 DIAGNOSIS — G43001 Migraine without aura, not intractable, with status migrainosus: Secondary | ICD-10-CM | POA: Diagnosis not present

## 2021-06-22 DIAGNOSIS — H0288A Meibomian gland dysfunction right eye, upper and lower eyelids: Secondary | ICD-10-CM | POA: Diagnosis not present

## 2021-06-22 DIAGNOSIS — H0288B Meibomian gland dysfunction left eye, upper and lower eyelids: Secondary | ICD-10-CM | POA: Diagnosis not present

## 2021-06-22 DIAGNOSIS — H04123 Dry eye syndrome of bilateral lacrimal glands: Secondary | ICD-10-CM | POA: Diagnosis not present

## 2021-06-22 MED ORDER — DOXYCYCLINE MONOHYDRATE 50 MG PO TABS
50.0000 mg | ORAL_TABLET | Freq: Two times a day (BID) | ORAL | 0 refills | Status: DC
  Filled 2021-06-22: qty 28, 14d supply, fill #0

## 2021-06-23 ENCOUNTER — Other Ambulatory Visit (HOSPITAL_COMMUNITY): Payer: Self-pay

## 2021-07-06 ENCOUNTER — Other Ambulatory Visit (HOSPITAL_COMMUNITY): Payer: Self-pay

## 2021-07-07 ENCOUNTER — Other Ambulatory Visit (HOSPITAL_COMMUNITY): Payer: Self-pay

## 2021-07-08 ENCOUNTER — Other Ambulatory Visit (HOSPITAL_COMMUNITY): Payer: Self-pay

## 2021-07-17 ENCOUNTER — Other Ambulatory Visit (HOSPITAL_COMMUNITY): Payer: Self-pay

## 2021-07-17 MED ORDER — TRINTELLIX 10 MG PO TABS
ORAL_TABLET | ORAL | 0 refills | Status: DC
  Filled 2021-07-17: qty 90, 90d supply, fill #0

## 2021-07-18 ENCOUNTER — Other Ambulatory Visit (HOSPITAL_COMMUNITY): Payer: Self-pay

## 2021-07-26 ENCOUNTER — Other Ambulatory Visit (HOSPITAL_COMMUNITY): Payer: Self-pay

## 2021-07-26 MED ORDER — AMPHETAMINE-DEXTROAMPHET ER 30 MG PO CP24
ORAL_CAPSULE | ORAL | 0 refills | Status: DC
  Filled 2021-07-26: qty 30, 30d supply, fill #0

## 2021-07-27 ENCOUNTER — Other Ambulatory Visit (HOSPITAL_COMMUNITY): Payer: Self-pay

## 2021-08-02 ENCOUNTER — Other Ambulatory Visit (HOSPITAL_COMMUNITY): Payer: Self-pay

## 2021-08-02 DIAGNOSIS — E876 Hypokalemia: Secondary | ICD-10-CM | POA: Diagnosis not present

## 2021-08-02 DIAGNOSIS — E785 Hyperlipidemia, unspecified: Secondary | ICD-10-CM | POA: Diagnosis not present

## 2021-08-02 DIAGNOSIS — F909 Attention-deficit hyperactivity disorder, unspecified type: Secondary | ICD-10-CM | POA: Diagnosis not present

## 2021-08-02 MED ORDER — POTASSIUM CHLORIDE ER 20 MEQ PO TBCR
1.0000 | EXTENDED_RELEASE_TABLET | Freq: Every day | ORAL | 2 refills | Status: DC
  Filled 2021-08-02: qty 30, 30d supply, fill #0
  Filled 2021-10-17: qty 30, 30d supply, fill #1
  Filled 2022-01-24: qty 30, 30d supply, fill #2

## 2021-08-02 MED ORDER — AMPHETAMINE-DEXTROAMPHET ER 30 MG PO CP24
30.0000 mg | ORAL_CAPSULE | Freq: Every day | ORAL | 0 refills | Status: DC
  Filled 2021-09-08: qty 30, 30d supply, fill #0

## 2021-08-10 ENCOUNTER — Other Ambulatory Visit (HOSPITAL_COMMUNITY): Payer: Self-pay

## 2021-08-15 ENCOUNTER — Other Ambulatory Visit (HOSPITAL_COMMUNITY): Payer: Self-pay

## 2021-08-16 ENCOUNTER — Other Ambulatory Visit (HOSPITAL_COMMUNITY): Payer: Self-pay

## 2021-08-16 MED ORDER — GABAPENTIN 600 MG PO TABS
ORAL_TABLET | ORAL | 3 refills | Status: DC
  Filled 2021-08-16: qty 150, 30d supply, fill #0
  Filled 2021-09-25: qty 150, 30d supply, fill #1
  Filled 2021-10-26: qty 150, 30d supply, fill #2
  Filled 2021-12-13: qty 150, 30d supply, fill #3

## 2021-09-08 ENCOUNTER — Other Ambulatory Visit (HOSPITAL_COMMUNITY): Payer: Self-pay

## 2021-09-09 ENCOUNTER — Other Ambulatory Visit (HOSPITAL_COMMUNITY): Payer: Self-pay

## 2021-09-11 ENCOUNTER — Other Ambulatory Visit (HOSPITAL_COMMUNITY): Payer: Self-pay

## 2021-09-11 MED ORDER — ALPRAZOLAM 0.5 MG PO TABS
ORAL_TABLET | ORAL | 2 refills | Status: DC
  Filled 2021-09-11: qty 30, 30d supply, fill #0
  Filled 2021-10-17: qty 30, 30d supply, fill #1
  Filled 2021-11-24: qty 30, 30d supply, fill #2

## 2021-09-25 ENCOUNTER — Other Ambulatory Visit (HOSPITAL_COMMUNITY): Payer: Self-pay

## 2021-10-01 ENCOUNTER — Other Ambulatory Visit: Payer: Self-pay

## 2021-10-01 ENCOUNTER — Emergency Department (HOSPITAL_COMMUNITY)
Admission: EM | Admit: 2021-10-01 | Discharge: 2021-10-01 | Disposition: A | Discharge status: Home / Self Care | Payer: PRIVATE HEALTH INSURANCE | Attending: Emergency Medicine | Admitting: Emergency Medicine

## 2021-10-01 ENCOUNTER — Emergency Department (HOSPITAL_COMMUNITY): Payer: 59

## 2021-10-01 ENCOUNTER — Encounter (HOSPITAL_COMMUNITY): Payer: Self-pay

## 2021-10-01 DIAGNOSIS — X500XXA Overexertion from strenuous movement or load, initial encounter: Secondary | ICD-10-CM | POA: Diagnosis not present

## 2021-10-01 DIAGNOSIS — Y990 Civilian activity done for income or pay: Secondary | ICD-10-CM | POA: Diagnosis not present

## 2021-10-01 DIAGNOSIS — S39012A Strain of muscle, fascia and tendon of lower back, initial encounter: Secondary | ICD-10-CM | POA: Diagnosis not present

## 2021-10-01 DIAGNOSIS — S3992XA Unspecified injury of lower back, initial encounter: Secondary | ICD-10-CM | POA: Diagnosis present

## 2021-10-01 MED ORDER — HYDROCODONE-ACETAMINOPHEN 5-325 MG PO TABS: 1.0000 | ORAL_TABLET | Freq: Four times a day (QID) | ORAL | 0 refills | Status: DC | PRN

## 2021-10-01 MED ORDER — CYCLOBENZAPRINE HCL 10 MG PO TABS
10.0000 mg | ORAL_TABLET | Freq: Once | ORAL | Status: AC
Start: 2021-10-01 — End: 2021-10-01
  Administered 2021-10-01: 10 mg via ORAL
  Filled 2021-10-01: qty 1

## 2021-10-01 MED ORDER — CYCLOBENZAPRINE HCL 10 MG PO TABS: 10.0000 mg | ORAL_TABLET | Freq: Two times a day (BID) | ORAL | 0 refills | Status: DC | PRN

## 2021-10-01 NOTE — ED Triage Notes (Signed)
Pt c/o lower back pain, states she injured it this morning at work.

## 2021-10-01 NOTE — ED Provider Notes (Signed)
[facility]-EMERGENCY DEPT Provider Note   CSN: [id number] Arrival date & time: 10/01/21  6213     History  Chief Complaint  Patient presents with   Back Pain    Rachel Griffith is a 36 y.o. female with medical history of endometriosis, chronic pelvic pain, back injury, recent L5-S1 discectomy in May.  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  Patient reports that about halfway through her shift she began experiencing spasming in her low back which she has been experienced in the past.  The patient reports that the spasming progressively worsened throughout the course of her shift becoming excruciating, causing 10 out of 10 pain.  Patient reports that this is the reason that she presented to the ED for evaluation.  Patient denies any red flag symptoms of low back pain, fevers, IV drug use.   Back Pain Associated symptoms: no fever, no numbness and no weakness        Home Medications Prior to Admission medications   Medication Sig Start Date End Date Taking? Authorizing Provider  cyclobenzaprine (FLEXERIL) 10 MG tablet Take 1 tablet (10 mg total) by mouth 2 (two) times daily as needed for muscle spasms. 10/01/21  Yes Al Decant, PA-C  HYDROcodone-acetaminophen (NORCO/VICODIN) 5-325 MG tablet Take 1 tablet by mouth every 6 (six) hours as needed for severe pain. 10/01/21  Yes Al Decant, PA-C  albuterol (VENTOLIN HFA) 108 (90 Base) MCG/ACT inhaler Inhale 2 puffs into the lungs every 4 (four) hours as needed for wheezing or shortness of breath. 04/19/19   [provider]  albuterol (VENTOLIN HFA) 108 (90 Base) MCG/ACT inhaler INHALE 2 PUFFS BY MOUTH EVERY 4 HOURS AS NEEDED 11/21/20     ALPRAZolam (XANAX) 0.5 MG tablet Take 0.5 mg by mouth at bedtime.    [provider]  ALPRAZolam Prudy Feeler)  0.5 MG tablet Take 1 tablet by mouth every day as needed for anxiety. 10/05/20   Nita Sells, MD  ALPRAZolam Prudy Feeler) 0.5 MG tablet TAKE 1 TABLET BY MOUTH EVERY DAY AS NEEDED FOR ANXIETY 09/11/21     amphetamine-dextroamphetamine (ADDERALL XR) 30 MG 24 hr capsule Take 1 capsule (30 mg total) by mouth daily. 08/02/21     amphetamine-dextroamphetamine (ADDERALL) 20 MG tablet Take 20 mg by mouth 2 (two) times daily.    [provider]  amphetamine-dextroamphetamine (ADDERALL) 20 MG tablet TAKE 1 TABLET BY MOUTH TWICE A DAY 12/26/20     desogestrel-ethinyl estradiol (CYRED EQ) 0.15-30 MG-MCG tablet Take 1 tablet by mouth daily 01/25/21   Lazaro Arms, MD  doxycycline (ADOXA) 50 MG tablet Take 1 tablet by mouth twice a day 06/22/21     erythromycin ophthalmic ointment Place a 1/2 inch ribbon of ointment into the lower eyelid. 04/29/21   Dahlia Byes A, NP  fludrocortisone (FLORINEF) 0.1 MG tablet Take 1 tablet by mouth 2  times daily. 01/03/21   Marinus Maw, MD  gabapentin (NEURONTIN) 600 MG tablet Take 600-1,800 mg by mouth See admin instructions. 600 mg bid, 1800 mg at bedtime    [provider]  gabapentin (NEURONTIN) 600 MG tablet Take 1 tablet by mouth twice daily and 3 tablets at bedtime. 08/16/21     ibuprofen (ADVIL) 200 MG tablet Take 800 mg by mouth every 6 (six) hours as needed for mild  pain or moderate pain.    [provider]  levocetirizine (XYZAL) 5 MG tablet Take 5 mg by mouth daily.    [provider]  midodrine (PROAMATINE) 5 MG tablet Take 1 tablet by mouth daily as needed. Do not take later than 6pm. Avoid after the evening meal or within 4 hours of bedtime. Patient not taking: Reported on 01/03/2021 11/10/20   Marinus Maw, MD  ondansetron (ZOFRAN ODT) 4 MG disintegrating tablet Take 1 tablet (4 mg total) by mouth every 8 (eight) hours as needed for nausea or vomiting. 05/31/19   Burgess Amor, PA-C  polyethylene glycol powder (GLYCOLAX/MIRALAX) 17 GM/SCOOP  powder Take 17 g by mouth daily as needed for mild constipation.    [provider]  Potassium Chloride ER 20 MEQ TBCR Take 1 tablet by mouth daily. 08/02/21     prednisoLONE acetate (PRED FORTE) 1 % ophthalmic suspension Place 1 drop into both eyes 4 times a day 06/07/21     vortioxetine HBr (TRINTELLIX) 10 MG TABS tablet Take 10 mg by mouth daily.    [provider]  vortioxetine HBr (TRINTELLIX) 10 MG TABS tablet Take 1 tablet by mouth once daily 10/04/20     vortioxetine HBr (TRINTELLIX) 10 MG TABS tablet Take 1 tablet by mouth every day. 07/17/21         Allergies    Almond meal, Cymbalta [duloxetine hcl], Sulfa antibiotics, Trazodone and nefazodone, and Sudafed [pseudoephedrine hcl]    Review of Systems   Review of Systems  Constitutional:  Negative for fever.  Genitourinary:        Patient denies bowel or bladder incontinence  Musculoskeletal:  Positive for back pain.  Neurological:  Negative for weakness and numbness.  All other systems reviewed and are negative.   Physical Exam Updated Vital Signs BP (!) 151/92   Pulse 70   Temp 98.3 F (36.8 C)   Resp 16   Ht 5\' 9"  (1.753 m)   Wt 65.8 kg   LMP 09/19/2014 Comment: post LAVH  SpO2 100%   BMI 21.41 kg/m  Physical Exam Vitals and nursing note reviewed.  Constitutional:      General: She is not in acute distress.    Appearance: Normal appearance. She is not ill-appearing, toxic-appearing or diaphoretic.  HENT:     Head: Normocephalic and atraumatic.     Nose: Nose normal. No congestion.     Mouth/Throat:     Mouth: Mucous membranes are moist.     Pharynx: Oropharynx is clear.  Eyes:     Extraocular Movements: Extraocular movements intact.     Conjunctiva/sclera: Conjunctivae normal.     Pupils: Pupils are equal, round, and reactive to light.  Cardiovascular:     Rate and Rhythm: Normal rate and regular rhythm.  Pulmonary:     Effort: Pulmonary effort is normal.     Breath sounds: Normal breath  sounds. No wheezing.  Abdominal:     General: Abdomen is flat. Bowel sounds are normal.     Palpations: Abdomen is soft.     Tenderness: There is no abdominal tenderness.  Musculoskeletal:     Cervical back: Normal range of motion and neck supple. No tenderness.     Lumbar back: Spasms and tenderness present. No swelling, edema, deformity or signs of trauma.     Comments: Focused lower back pain left and right.  No overlying skin change.  Lumbar spine palpated without findings of crepitus, deformity or step-off.  Skin:  General: Skin is warm and dry.     Capillary Refill: Capillary refill takes less than 2 seconds.  Neurological:     Mental Status: She is alert and oriented to person, place, and time.     ED Results / Procedures / Treatments   Labs (all labs ordered are listed, but only abnormal results are displayed) Labs Reviewed - No data to display  EKG None  Radiology No results found.  Procedures Procedures   Medications Ordered in ED Medications  cyclobenzaprine (FLEXERIL) tablet 10 mg (10 mg Oral Given 10/01/21 0844)    ED Course/ Medical Decision Making/ A&P                           Medical Decision Making Amount and/or Complexity of Data Reviewed Radiology: ordered.  Risk Prescription drug management.   36 year old female presents to ED for evaluation.  Please see HPI for further details.   On examination, patient is afebrile and nontachycardic.  The patient was found to clear bilaterally, she is not hypoxic on room air.  Patient abdomen soft and compressible all 4 quadrants.  Patient is logical examination shows no focal neurodeficits.  Patient lumbar spinal area palpated without findings of crepitus, deformity or step-off.  There is no overlying skin change.  Patient worked up utilizing the following imaging studies interpreted by me personally: - CT lumbar spine shows mild disc base bulging at L2-L3, L4-L5, L5-S1.  There are no changes seen to the  L5-S1 postoperative site.  Patient will be discharged home and advised to follow back up with her spine surgeon who completed her first operation.  The patient will be given short course of pain medication, muscle relaxers.  The patient was given return precautions to include red flag symptoms of low back pain and she voiced understanding of this.  The patient had all of her questions answered to her satisfaction prior to discharge.  The patient is stable for discharge at this time.  Final Clinical Impression(s) / ED Diagnoses Final diagnoses:  Strain of lumbar region, initial encounter    Rx / DC Orders ED Discharge Orders          Ordered    cyclobenzaprine (FLEXERIL) 10 MG tablet  2 times daily PRN        10/01/21 1109    HYDROcodone-acetaminophen (NORCO/VICODIN) 5-325 MG tablet  Every 6 hours PRN        10/01/21 1109              Al Decant, PA-C 10/01/21 1117    Gloris Manchester, MD 10/02/21 [phone]

## 2021-10-01 NOTE — Discharge Instructions (Signed)
Please return to the ED with any new symptoms such as groin numbness, lower extremity weakness, inability to control bowel or bladder Please follow-up with your surgeon Please read attached guide concerning back injury prevention as well as lumbar strain Please pick up medications have sent in for you.  Please do not drive or operate heavy machinery on these medications.

## 2021-10-15 ENCOUNTER — Other Ambulatory Visit: Payer: Self-pay

## 2021-10-15 ENCOUNTER — Emergency Department (HOSPITAL_COMMUNITY)
Admission: EM | Admit: 2021-10-15 | Discharge: 2021-10-15 | Disposition: A | Discharge status: Home / Self Care | Payer: PRIVATE HEALTH INSURANCE | Attending: Emergency Medicine | Admitting: Emergency Medicine

## 2021-10-15 ENCOUNTER — Encounter (HOSPITAL_COMMUNITY): Payer: Self-pay

## 2021-10-15 DIAGNOSIS — M549 Dorsalgia, unspecified: Secondary | ICD-10-CM | POA: Diagnosis present

## 2021-10-15 DIAGNOSIS — Y990 Civilian activity done for income or pay: Secondary | ICD-10-CM | POA: Diagnosis not present

## 2021-10-15 DIAGNOSIS — M5417 Radiculopathy, lumbosacral region: Secondary | ICD-10-CM | POA: Insufficient documentation

## 2021-10-15 MED ORDER — DEXAMETHASONE SODIUM PHOSPHATE 10 MG/ML IJ SOLN
10.0000 mg | Freq: Once | INTRAMUSCULAR | Status: AC
  Administered 2021-10-15: 10 mg via INTRAMUSCULAR
  Filled 2021-10-15: qty 1

## 2021-10-15 NOTE — ED Provider Notes (Signed)
[facility]-EMERGENCY DEPT Provider Note   CSN: [id number] Arrival date & time: 10/15/21  6967     History  Chief Complaint  Patient presents with   Back Pain    Rachel Griffith is a 36 y.o. female.  HPI     36 year old female with history of degenerative spine disease status post microsurgery of her spine earlier this year comes in with chief complaint of back pain.  Patient works as a Engineer, civil (consulting) at [employer].  She indicates that few days back she sustained injury while at work.  She has seen employee health, and has been taking precautions as much as possible while at work -awaiting neurosurgery follow-up.  However, last night she was taking care of a patient who was constantly trying to get up from the bed and she thinks she might have possibly injured her back.  This morning she started noticing some numbness over her left leg below the knee.  Numbness is described as pins and needle sensation.  She thought that she was just imagining the pain in started driving home, however the symptoms became more intense and she decided to come to the ER.    Pt has no associated weakness, urinary incontinence, urinary retention, bowel incontinence, pins and needle sensation in the perineal area.  She denies worsening of pain, but the numbness is new.   Home Medications Prior to Admission medications   Medication Sig Start Date End Date Taking? Authorizing Provider  albuterol (VENTOLIN HFA) 108 (90 Base) MCG/ACT inhaler Inhale 2 puffs into the lungs every 4 (four) hours as needed for wheezing or shortness of breath. 04/19/19   [provider]  albuterol (VENTOLIN HFA) 108 (90 Base) MCG/ACT inhaler INHALE 2 PUFFS BY MOUTH EVERY 4 HOURS AS NEEDED 11/21/20     ALPRAZolam (XANAX) 0.5 MG tablet Take 0.5 mg by mouth at bedtime.    [provider]  ALPRAZolam Prudy Feeler) 0.5 MG tablet Take 1 tablet by mouth every day as needed for anxiety. 10/05/20   Nita Sells, MD  ALPRAZolam Prudy Feeler) 0.5 MG tablet TAKE 1 TABLET BY MOUTH EVERY DAY AS NEEDED FOR ANXIETY 09/11/21     amphetamine-dextroamphetamine (ADDERALL XR) 30 MG 24 hr capsule Take 1 capsule (30 mg total) by mouth daily. 08/02/21     amphetamine-dextroamphetamine (ADDERALL) 20 MG tablet Take 20 mg by mouth 2 (two) times daily.    [provider]  amphetamine-dextroamphetamine (ADDERALL) 20 MG tablet TAKE 1 TABLET BY MOUTH TWICE A DAY 12/26/20     cyclobenzaprine (FLEXERIL) 10 MG tablet Take 1 tablet (10 mg total) by mouth 2 (two) times daily as needed for muscle spasms. 10/01/21   Al Decant, PA-C  desogestrel-ethinyl estradiol (CYRED EQ) 0.15-30 MG-MCG tablet Take 1 tablet by mouth daily 01/25/21   Lazaro Arms, MD  doxycycline (ADOXA) 50 MG tablet Take 1 tablet by mouth twice a day 06/22/21     erythromycin ophthalmic ointment Place a 1/2 inch ribbon of ointment into the lower eyelid. 04/29/21   Dahlia Byes A, NP  fludrocortisone (FLORINEF) 0.1 MG tablet Take 1 tablet by mouth 2  times daily. 01/03/21   Marinus Maw, MD  gabapentin (NEURONTIN) 600 MG tablet Take 600-1,800 mg by mouth See admin instructions. 600 mg bid, 1800 mg at bedtime    [provider]  gabapentin (NEURONTIN) 600 MG tablet Take 1 tablet by mouth twice daily and 3 tablets at bedtime. 08/16/21     HYDROcodone-acetaminophen (  NORCO/VICODIN) 5-325 MG tablet Take 1 tablet by mouth every 6 (six) hours as needed for severe pain. 10/01/21   Al Decant, PA-C  ibuprofen (ADVIL) 200 MG tablet Take 800 mg by mouth every 6 (six) hours as needed for mild pain or moderate pain.    [provider]  levocetirizine (XYZAL) 5 MG tablet Take 5 mg by mouth daily.    [provider]  midodrine (PROAMATINE) 5 MG tablet Take 1 tablet by mouth daily as needed. Do not take later than 6pm. Avoid after the evening meal or within 4 hours of bedtime. Patient not taking: Reported on 01/03/2021 11/10/20   Marinus Maw, MD  ondansetron (ZOFRAN ODT) 4 MG disintegrating tablet Take 1 tablet (4 mg total) by mouth every 8 (eight) hours as needed for nausea or vomiting. 05/31/19   Burgess Amor, PA-C  polyethylene glycol powder (GLYCOLAX/MIRALAX) 17 GM/SCOOP powder Take 17 g by mouth daily as needed for mild constipation.    [provider]  Potassium Chloride ER 20 MEQ TBCR Take 1 tablet by mouth daily. 08/02/21     prednisoLONE acetate (PRED FORTE) 1 % ophthalmic suspension Place 1 drop into both eyes 4 times a day 06/07/21     vortioxetine HBr (TRINTELLIX) 10 MG TABS tablet Take 10 mg by mouth daily.    [provider]  vortioxetine HBr (TRINTELLIX) 10 MG TABS tablet Take 1 tablet by mouth once daily 10/04/20     vortioxetine HBr (TRINTELLIX) 10 MG TABS tablet Take 1 tablet by mouth every day. 07/17/21         Allergies    Almond meal, Cymbalta [duloxetine hcl], Sulfa antibiotics, Trazodone and nefazodone, and Sudafed [pseudoephedrine hcl]    Review of Systems   Review of Systems  Physical Exam Updated Vital Signs BP (!) 164/94   Pulse 73   Temp 98.5 F (36.9 C) (Oral)   Resp 16   Ht 5\' 9"  (1.753 m)   Wt 65.8 kg   LMP 09/19/2014 Comment: post LAVH  SpO2 97%   BMI 21.41 kg/m  Physical Exam Vitals and nursing note reviewed.  Constitutional:      Appearance: She is well-developed.  HENT:     Head: Atraumatic.  Cardiovascular:     Rate and Rhythm: Normal rate.  Pulmonary:     Effort: Pulmonary effort is normal.  Musculoskeletal:     Cervical back: Normal range of motion and neck supple.  Skin:    General: Skin is warm and dry.  Neurological:     Mental Status: She is alert and oriented to person, place, and time.     Sensory: Sensory deficit present.     Motor: No weakness.     Comments: Patient is having subjective numbness over the left leg anteriorly.  Two-point discrimination is intact     ED Results / Procedures / Treatments   Labs (all labs ordered are listed,  but only abnormal results are displayed) Labs Reviewed - No data to display  EKG None  Radiology No results found.  Procedures Procedures    Medications Ordered in ED Medications  dexamethasone (DECADRON) injection 10 mg (has no administration in time range)    ED Course/ Medical Decision Making/ A&P                           Medical Decision Making Risk Prescription drug management.   This patient presents to the ED with  chief complaint(s) of back pain and numbness over the left leg with pertinent past medical history of herniated disc status post microsurgery of her lumbar spine earlier this year.The complaint involves an extensive differential diagnosis and also carries with it a high risk of complications and morbidity.    No red flag suggesting cauda equina or cord compression.  The differential diagnosis includes worsening herniated disc leading to radiculopathy, early myelopathy.   Additional history obtained: Records reviewed  CT lumbar spine last week, which revealed some evidence of degenerative spine disease.  Also reviewed MRI from earlier this year which had shown herniated disc  Clinically, we suspect that patient is having aggravation of her chronic spine disease.  I do not think x-ray or CT scan is going to add value to this encounter.  Patient does not need emergent MRI.  Therefore, although imaging was considered, it is not deemed helpful in this clinical encounter.  Plan is to give patient dexamethasone IM.  She will continue NSAIDs and muscle relaxants. She needs neurosurgical follow-up and she might benefit with some physical therapy. I do not think it is a good idea for her to come to work today given the new numbness in her leg, therefore work note has been provided.   Complexity of problems addressed:  4: acute, complicated injury Final Clinical Impression(s) / ED Diagnoses Final diagnoses:  Lumbosacral radiculopathy    Rx / DC Orders ED  Discharge Orders     None         Derwood Kaplan, MD 10/15/21 1101

## 2021-10-15 NOTE — ED Triage Notes (Signed)
Pt reports ongoing lower back pain. She was seen on the 6th after injuring it at work. Pt reports after leaving work she began having spasms and pain and numbness shooting down her left leg.

## 2021-10-15 NOTE — Discharge Instructions (Signed)
Please call Neurosurgery for optimal management. Work Note provided.  Return to the ER if you worsening pain, worsening numbness, bilateral leg weakness, urinary incontinence, urinary retention, bowel incontinence, pins and needle sensation around your anogenital region.

## 2021-10-17 ENCOUNTER — Other Ambulatory Visit (HOSPITAL_COMMUNITY): Payer: Self-pay

## 2021-10-18 ENCOUNTER — Other Ambulatory Visit (HOSPITAL_COMMUNITY): Payer: Self-pay

## 2021-10-18 MED ORDER — TRINTELLIX 10 MG PO TABS
10.0000 mg | ORAL_TABLET | Freq: Every day | ORAL | 0 refills | Status: DC
  Filled 2021-10-18: qty 90, 90d supply, fill #0

## 2021-10-20 ENCOUNTER — Ambulatory Visit
Admission: EM | Admit: 2021-10-20 | Discharge: 2021-10-20 | Disposition: A | Discharge status: Home / Self Care | Payer: 59 | Attending: Family Medicine | Admitting: Family Medicine

## 2021-10-20 ENCOUNTER — Encounter: Payer: Self-pay | Admitting: Emergency Medicine

## 2021-10-20 DIAGNOSIS — M5441 Lumbago with sciatica, right side: Secondary | ICD-10-CM

## 2021-10-20 DIAGNOSIS — M5442 Lumbago with sciatica, left side: Secondary | ICD-10-CM

## 2021-10-20 MED ORDER — ORPHENADRINE CITRATE ER 100 MG PO TB12: 100.0000 mg | ORAL_TABLET | Freq: Two times a day (BID) | ORAL | 0 refills | Status: DC | PRN

## 2021-10-20 MED ORDER — PREDNISONE 10 MG PO TABS: ORAL_TABLET | ORAL | 0 refills | Status: DC

## 2021-10-20 MED ORDER — HYDROCODONE-ACETAMINOPHEN 5-325 MG PO TABS
1.0000 | ORAL_TABLET | Freq: Two times a day (BID) | ORAL | 0 refills | Status: DC | PRN
Start: 2021-10-20 — End: 2022-05-11

## 2021-10-20 NOTE — ED Triage Notes (Signed)
Back injury from moving patient 2 weeks ago.  This morning while standing, pain in lower back that shoots down legs.    Has tried heating pads and back braces for pain along with tylenol and motrin.    States she is continuing to wait on worker comp to help.

## 2021-10-20 NOTE — ED Provider Notes (Signed)
[facility]    CSN: [id number] Arrival date & time: 10/20/21  0272      History   Chief Complaint No chief complaint on file.   HPI Rachel Griffith is a 36 y.o. female.   Patient presenting today with severe low back pain radiating down both legs that started about 2 weeks ago when she was moving a patient at work.  She states she went to the emergency department after the incident and that the CT scan there showed new herniated disks.  She was given muscle relaxers, pain medication which helped only minimally.  She has been trying hard to take it easy but the pain worsened about 4 days ago.  She was seen again in the emergency department and given a steroid injection but that has not provided any relief.  She has a history of chronic back issues status post microdiscectomy earlier this year.  She states this is a new incident and does not feel similar to previous incident.  Denies any bowel or bladder incontinence, saddle anesthesia, fever, chills.    Past Medical History:  Diagnosis Date   Anxiety    Back injury    Chronic pelvic pain in female    Endometriosis    Endometriosis determined by laparoscopy 10/06/2014   Ovarian cyst    Pelvic pain in female 10/06/2014   PONV (postoperative nausea and vomiting)    pt states that with 1st arthroscopy she "passed out and couldnt left her head" but they tell me it was a combination of the pain meds and anesthesia" No problems with next surgery. Not sure of meds given in anesthesia.   POTS (postural orthostatic tachycardia syndrome)    pt reports this was diagnosed a couple of months ago by PCP (Dr Annia Friendly)   PTSD (post-traumatic stress disorder)     Patient Active Problem List   Diagnosis Date Noted   Headache 07/10/2020   Left ovarian cyst 07/31/2018   Ovarian cyst, bilateral 02/08/2016   Postoperative abdominal pain 11/24/2014   Status post laparoscopic assisted vaginal hysterectomy (LAVH) 11/23/2014   Pelvic pain  in female 10/06/2014   Endometriosis determined by laparoscopy 10/06/2014    Past Surgical History:  Procedure Laterality Date   ABDOMINAL HYSTERECTOMY     endometrosis  2014   [facility]   KNEE SURGERY Bilateral    x2   LAPAROSCOPIC BILATERAL SALPINGO OOPHERECTOMY Left 07/31/2018   Procedure: LAPAROSCOPIC LEFT SALPINGO OOPHORECTOMY;  Surgeon: Lazaro Arms, MD;  Location: [facility];  Service: Gynecology;  Laterality: Left;   LAPAROSCOPY N/A 11/26/2014   Procedure: DIAGNOSTIC LAPAROSCOPY;  Surgeon: [name], MD;  Location: [facility];  Service: Gynecology;  Laterality: N/A;   MICRODISCECTOMY LUMBAR  07/01/2020    OB History     Gravida  2   Para  2   Term  2   Preterm      AB      Living  2      SAB      IAB      Ectopic      Multiple      Live Births               Home Medications    Prior to Admission medications   Medication Sig Start Date End Date Taking? Authorizing Provider  HYDROcodone-acetaminophen (NORCO/VICODIN) 5-325 MG tablet Take 1 tablet by mouth 2 (two) times daily as needed for moderate pain. 10/20/21  Yes Particia Nearing, PA-C  orphenadrine (NORFLEX) 100 MG tablet Take 1 tablet (100 mg total) by mouth 2 (two) times daily as needed for muscle spasms. Do not drink alcohol or drive while taking this medication.  May cause drowsiness. 10/20/21  Yes Particia Nearing, PA-C  predniSONE (DELTASONE) 10 MG tablet Take 6 tabs day one, 5 tabs day two, 4 tabs day three, etc 10/20/21  Yes Particia Nearing, PA-C  albuterol (VENTOLIN HFA) 108 (90 Base) MCG/ACT inhaler Inhale 2 puffs into the lungs every 4 (four) hours as needed for wheezing or shortness of breath. 04/19/19   [provider]  albuterol (VENTOLIN HFA) 108 (90 Base) MCG/ACT inhaler INHALE 2 PUFFS BY MOUTH EVERY 4 HOURS AS NEEDED 11/21/20     ALPRAZolam (XANAX) 0.5 MG tablet Take 0.5 mg by mouth at bedtime.    [provider]  ALPRAZolam Prudy Feeler) 0.5 MG  tablet Take 1 tablet by mouth every day as needed for anxiety. 10/05/20   Nita Sells, MD  ALPRAZolam Prudy Feeler) 0.5 MG tablet TAKE 1 TABLET BY MOUTH EVERY DAY AS NEEDED FOR ANXIETY 09/11/21     amphetamine-dextroamphetamine (ADDERALL XR) 30 MG 24 hr capsule Take 1 capsule (30 mg total) by mouth daily. 08/02/21     amphetamine-dextroamphetamine (ADDERALL) 20 MG tablet Take 20 mg by mouth 2 (two) times daily.    [provider]  amphetamine-dextroamphetamine (ADDERALL) 20 MG tablet TAKE 1 TABLET BY MOUTH TWICE A DAY 12/26/20     cyclobenzaprine (FLEXERIL) 10 MG tablet Take 1 tablet (10 mg total) by mouth 2 (two) times daily as needed for muscle spasms. 10/01/21   Al Decant, PA-C  desogestrel-ethinyl estradiol (CYRED EQ) 0.15-30 MG-MCG tablet Take 1 tablet by mouth daily 01/25/21   Lazaro Arms, MD  doxycycline (ADOXA) 50 MG tablet Take 1 tablet by mouth twice a day 06/22/21     erythromycin ophthalmic ointment Place a 1/2 inch ribbon of ointment into the lower eyelid. 04/29/21   Dahlia Byes A, NP  fludrocortisone (FLORINEF) 0.1 MG tablet Take 1 tablet by mouth 2  times daily. 01/03/21   Marinus Maw, MD  gabapentin (NEURONTIN) 600 MG tablet Take 600-1,800 mg by mouth See admin instructions. 600 mg bid, 1800 mg at bedtime    [provider]  gabapentin (NEURONTIN) 600 MG tablet Take 1 tablet by mouth twice daily and 3 tablets at bedtime. 08/16/21     HYDROcodone-acetaminophen (NORCO/VICODIN) 5-325 MG tablet Take 1 tablet by mouth every 6 (six) hours as needed for severe pain. 10/01/21   Al Decant, PA-C  ibuprofen (ADVIL) 200 MG tablet Take 800 mg by mouth every 6 (six) hours as needed for mild pain or moderate pain.    [provider]  levocetirizine (XYZAL) 5 MG tablet Take 5 mg by mouth daily.    [provider]  midodrine (PROAMATINE) 5 MG tablet Take 1 tablet by mouth daily as needed. Do not take later than 6pm. Avoid after the evening meal or within 4  hours of bedtime. Patient not taking: Reported on 01/03/2021 11/10/20   Marinus Maw, MD  ondansetron (ZOFRAN ODT) 4 MG disintegrating tablet Take 1 tablet (4 mg total) by mouth every 8 (eight) hours as needed for nausea or vomiting. 05/31/19   Burgess Amor, PA-C  polyethylene glycol powder (GLYCOLAX/MIRALAX) 17 GM/SCOOP powder Take 17 g by mouth daily as needed for mild constipation.    [provider]  Potassium Chloride ER 20 MEQ TBCR Take 1 tablet by  mouth daily. 08/02/21     prednisoLONE acetate (PRED FORTE) 1 % ophthalmic suspension Place 1 drop into both eyes 4 times a day 06/07/21     vortioxetine HBr (TRINTELLIX) 10 MG TABS tablet Take 10 mg by mouth daily.    [provider]  vortioxetine HBr (TRINTELLIX) 10 MG TABS tablet Take 1 tablet by mouth once daily 10/04/20     vortioxetine HBr (TRINTELLIX) 10 MG TABS tablet Take 1 tablet by mouth every day. 10/18/21       Family History Family History  Problem Relation Age of Onset   Cancer Father        prostate   Other Sister        IBS   Other Brother        Lyme Disease   Dementia Maternal Grandmother    Parkinson's disease Maternal Grandfather    Thrombocytopenia Sister     Social History Social History   Tobacco Use   Smoking status: Former    Packs/day: 0.50    Types: Cigarettes, E-cigarettes    Quit date: 02/28/2019    Years since quitting: 2.6   Smokeless tobacco: Never  Vaping Use   Vaping Use: Former  Substance Use Topics   Alcohol use: Yes    Comment: occ   Drug use: No     Allergies   Almond meal, Cymbalta [duloxetine hcl], Sulfa antibiotics, Trazodone and nefazodone, and Sudafed [pseudoephedrine hcl]   Review of Systems Review of Systems Per HPI  Physical Exam Triage Vital Signs ED Triage Vitals  Enc Vitals Group     BP 10/20/21 0926 (!) 163/88     Pulse Rate 10/20/21 0926 78     Resp 10/20/21 0926 18     Temp 10/20/21 0926 98.4 F (36.9 C)     Temp Source 10/20/21 0926 Oral      SpO2 10/20/21 0926 96 %     Weight --      Height --      Head Circumference --      Peak Flow --      Pain Score 10/20/21 0927 9     Pain Loc --      Pain Edu? --      Excl. in GC? --    No data found.  Updated Vital Signs BP (!) 163/88 (BP Location: Right Arm)   Pulse 78   Temp 98.4 F (36.9 C) (Oral)   Resp 18   LMP 09/19/2014 Comment: post LAVH  SpO2 96%   Visual Acuity Right Eye Distance:   Left Eye Distance:   Bilateral Distance:    Right Eye Near:   Left Eye Near:    Bilateral Near:     Physical Exam Vitals and nursing note reviewed.  Constitutional:      Appearance: Normal appearance. She is not ill-appearing.  HENT:     Head: Atraumatic.     Mouth/Throat:     Mouth: Mucous membranes are moist.  Eyes:     Extraocular Movements: Extraocular movements intact.     Conjunctiva/sclera: Conjunctivae normal.  Cardiovascular:     Rate and Rhythm: Normal rate and regular rhythm.     Heart sounds: Normal heart sounds.  Pulmonary:     Effort: Pulmonary effort is normal.     Breath sounds: Normal breath sounds.  Musculoskeletal:        General: Signs of injury present. Normal range of motion.     Cervical back: Normal range of  motion and neck supple.     Comments: Normal gait and range of motion.  Negative straight leg raise bilaterally  Skin:    General: Skin is warm and dry.  Neurological:     Mental Status: She is alert and oriented to person, place, and time.     Motor: No weakness.     Gait: Gait normal.     Comments: Bilateral lower extremities neurovascularly intact  Psychiatric:        Mood and Affect: Mood normal.        Thought Content: Thought content normal.        Judgment: Judgment normal.    UC Treatments / Results  Labs (all labs ordered are listed, but only abnormal results are displayed) Labs Reviewed - No data to display  EKG  Radiology No results found.  Procedures Procedures (including critical care time)  Medications  Ordered in UC Medications - No data to display  Initial Impression / Assessment and Plan / UC Course  I have reviewed the triage vital signs and the nursing notes.  Pertinent labs & imaging results that were available during my care of the patient were reviewed by me and considered in my medical decision making (see chart for details).     She is currently awaiting neurosurgery follow-up, in the meantime will treat with prednisone taper, very small supply of hydrocodone with precautions, muscle relaxers.  Discussed supportive home care and strict return precautions for any worsening symptoms, particularly bowel or bladder incontinence or saddle anesthesia.  PDMP reviewed and appropriate.  Final Clinical Impressions(s) / UC Diagnoses   Final diagnoses:  Acute bilateral low back pain with bilateral sciatica   Discharge Instructions   None    ED Prescriptions     Medication Sig Dispense Auth. Provider   predniSONE (DELTASONE) 10 MG tablet Take 6 tabs day one, 5 tabs day two, 4 tabs day three, etc 21 tablet Particia Nearing, PA-C   orphenadrine (NORFLEX) 100 MG tablet Take 1 tablet (100 mg total) by mouth 2 (two) times daily as needed for muscle spasms. Do not drink alcohol or drive while taking this medication.  May cause drowsiness. 20 tablet Particia Nearing, [place]   HYDROcodone-acetaminophen (NORCO/VICODIN) 5-325 MG tablet Take 1 tablet by mouth 2 (two) times daily as needed for moderate pain. 10 tablet Particia Nearing, [place]      I have reviewed the PDMP during this encounter.   Particia Nearing, [place] 10/20/21 1003

## 2021-10-26 ENCOUNTER — Other Ambulatory Visit (HOSPITAL_COMMUNITY): Payer: Self-pay

## 2021-10-26 DIAGNOSIS — M5136 Other intervertebral disc degeneration, lumbar region: Secondary | ICD-10-CM | POA: Diagnosis not present

## 2021-10-26 DIAGNOSIS — M5416 Radiculopathy, lumbar region: Secondary | ICD-10-CM | POA: Diagnosis not present

## 2021-11-02 ENCOUNTER — Other Ambulatory Visit (HOSPITAL_COMMUNITY): Payer: Self-pay

## 2021-11-02 DIAGNOSIS — F909 Attention-deficit hyperactivity disorder, unspecified type: Secondary | ICD-10-CM | POA: Diagnosis not present

## 2021-11-02 MED ORDER — AMPHETAMINE-DEXTROAMPHET ER 25 MG PO CP24
ORAL_CAPSULE | ORAL | 0 refills | Status: DC
  Filled 2021-11-02: qty 30, 30d supply, fill #0

## 2021-11-06 ENCOUNTER — Other Ambulatory Visit: Payer: Self-pay | Admitting: Student

## 2021-11-06 ENCOUNTER — Other Ambulatory Visit (HOSPITAL_COMMUNITY): Payer: Self-pay | Admitting: Student

## 2021-11-06 DIAGNOSIS — M5416 Radiculopathy, lumbar region: Secondary | ICD-10-CM

## 2021-11-07 ENCOUNTER — Other Ambulatory Visit (HOSPITAL_COMMUNITY): Payer: Self-pay

## 2021-11-09 ENCOUNTER — Other Ambulatory Visit (HOSPITAL_COMMUNITY): Payer: Self-pay

## 2021-11-12 ENCOUNTER — Encounter: Payer: Self-pay | Admitting: Emergency Medicine

## 2021-11-12 ENCOUNTER — Ambulatory Visit
Admission: EM | Admit: 2021-11-12 | Discharge: 2021-11-12 | Disposition: A | Discharge status: Home / Self Care | Payer: 59 | Attending: Nurse Practitioner | Admitting: Nurse Practitioner

## 2021-11-12 DIAGNOSIS — Z1152 Encounter for screening for COVID-19: Secondary | ICD-10-CM | POA: Insufficient documentation

## 2021-11-12 DIAGNOSIS — J069 Acute upper respiratory infection, unspecified: Secondary | ICD-10-CM | POA: Insufficient documentation

## 2021-11-12 MED ORDER — BENZONATATE 100 MG PO CAPS: 100.0000 mg | ORAL_CAPSULE | Freq: Three times a day (TID) | ORAL | 0 refills | Status: DC | PRN

## 2021-11-12 NOTE — Discharge Instructions (Addendum)
Your symptoms and exam findings are most consistent with a viral upper respiratory infection. These usually run their course in about 10 days.  If your symptoms last longer than 10 days without improvement, please follow up with your primary care provider.  If your symptoms, worsen, please go to the Emergency Room.    We have tested you today for COVID-19 and influenza.  You will see the results in Mychart and we will call you with positive results.    Please stay home and isolate until you are aware of the results.    Some things that can make you feel better are: - Increased rest - Increasing fluid with water/sugar free electrolytes - Acetaminophen and ibuprofen as needed for fever/pain.  - Salt water gargling, chloraseptic spray and throat lozenges - OTC guaifenesin (Mucinex).  - Saline sinus flushes or a neti pot.  - Humidifying the air. -Cough Perles as needed during the day for dry cough

## 2021-11-12 NOTE — ED Provider Notes (Signed)
[facility]    CSN: [id number] Arrival date & time: 11/12/21  1457      History   Chief Complaint No chief complaint on file.   HPI Rachel Griffith is a 36 y.o. female.   Patient presents with 1 day of fever, congested cough, chest congestion, nasal congestion, sore throat, decreased appetite, and fatigue.  She denies shortness of breath, wheezing, chest pain or tightness, runny nose, postnasal drainage, headache, tooth or ear pain, abdominal pain, nausea/vomiting, diarrhea.  No new rash.  Reports she has taken Tylenol when she woke up this afternoon with minimal relief.  Patient reports a history of year-round allergies, takes Xyzal and nasal spray when she remembers.  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    Past Medical History:  Diagnosis Date   Anxiety    Back injury    Chronic pelvic pain in female    Endometriosis    Endometriosis determined by laparoscopy 10/06/2014   Ovarian cyst    Pelvic pain in female 10/06/2014   PONV (postoperative nausea and vomiting)    pt states that with 1st arthroscopy she "passed out and couldnt left her head" but they tell me it was a combination of the pain meds and anesthesia" No problems with next surgery. Not sure of meds given in anesthesia.   POTS (postural orthostatic tachycardia syndrome)    pt reports this was diagnosed a couple of months ago by PCP (Dr Annia Friendly)   PTSD (post-traumatic stress disorder)     Patient Active Problem List   Diagnosis Date Noted   Headache 07/10/2020   Left ovarian cyst 07/31/2018   Ovarian cyst, bilateral 02/08/2016   Postoperative abdominal pain 11/24/2014   Status post laparoscopic assisted vaginal hysterectomy (LAVH) 11/23/2014   Pelvic pain in female 10/06/2014   Endometriosis determined by laparoscopy 10/06/2014    Past Surgical History:  Procedure Laterality Date   ABDOMINAL HYSTERECTOMY      endometrosis  2014   fulgeration-[facility]   KNEE SURGERY Bilateral    x2   LAPAROSCOPIC BILATERAL SALPINGO OOPHERECTOMY Left 07/31/2018   Procedure: LAPAROSCOPIC LEFT SALPINGO OOPHORECTOMY;  Surgeon: Lazaro Arms, MD;  Location: [facility];  Service: Gynecology;  Laterality: Left;   LAPAROSCOPY N/A 11/26/2014   Procedure: DIAGNOSTIC LAPAROSCOPY;  Surgeon: [name], MD;  Location: [facility];  Service: Gynecology;  Laterality: N/A;   MICRODISCECTOMY LUMBAR  07/01/2020    OB History     Gravida  2   Para  2   Term  2   Preterm      AB      Living  2      SAB      IAB      Ectopic      Multiple      Live Births               Home Medications    Prior to Admission medications   Medication Sig Start Date End Date Taking? Authorizing Provider  benzonatate (TESSALON) 100 MG capsule Take 1 capsule (100 mg total) by mouth 3 (three) times daily as needed for cough. Do not take with alcohol or while driving or operating heavy machinery 11/12/21  Yes Valentino Nose, NP  albuterol (VENTOLIN HFA) 108 (90 Base) MCG/ACT inhaler Inhale 2 puffs into the lungs every 4 (four) hours as needed for wheezing or shortness  of breath. 04/19/19   [provider]  albuterol (VENTOLIN HFA) 108 (90 Base) MCG/ACT inhaler INHALE 2 PUFFS BY MOUTH EVERY 4 HOURS AS NEEDED 11/21/20     ALPRAZolam (XANAX) 0.5 MG tablet Take 0.5 mg by mouth at bedtime.    [provider]  ALPRAZolam Prudy Feeler(XANAX) 0.5 MG tablet Take 1 tablet by mouth every day as needed for anxiety. 10/05/20   Nita SellsHall, John, MD  ALPRAZolam Prudy Feeler(XANAX) 0.5 MG tablet TAKE 1 TABLET BY MOUTH EVERY DAY AS NEEDED FOR ANXIETY 09/11/21     amphetamine-dextroamphetamine (ADDERALL XR) 25 MG 24 hr capsule Take 1 capsule by mouth daily for 30 days. 11/02/21     amphetamine-dextroamphetamine (ADDERALL XR) 30 MG 24 hr capsule Take 1 capsule (30 mg total) by mouth daily. 08/02/21     amphetamine-dextroamphetamine (ADDERALL) 20 MG tablet Take 20 mg by  mouth 2 (two) times daily.    [provider]  amphetamine-dextroamphetamine (ADDERALL) 20 MG tablet TAKE 1 TABLET BY MOUTH TWICE A DAY 12/26/20     cyclobenzaprine (FLEXERIL) 10 MG tablet Take 1 tablet (10 mg total) by mouth 2 (two) times daily as needed for muscle spasms. 10/01/21   Al DecantGroce, Christopher F, PA-C  desogestrel-ethinyl estradiol (CYRED EQ) 0.15-30 MG-MCG tablet Take 1 tablet by mouth daily 01/25/21   Lazaro ArmsEure, Luther H, MD  doxycycline (ADOXA) 50 MG tablet Take 1 tablet by mouth twice a day 06/22/21     erythromycin ophthalmic ointment Place a 1/2 inch ribbon of ointment into the lower eyelid. 04/29/21   Dahlia ByesBast, Traci A, NP  fludrocortisone (FLORINEF) 0.1 MG tablet Take 1 tablet by mouth 2  times daily. 01/03/21   Marinus Mawaylor, Gregg W, MD  gabapentin (NEURONTIN) 600 MG tablet Take 600-1,800 mg by mouth See admin instructions. 600 mg bid, 1800 mg at bedtime    [provider]  gabapentin (NEURONTIN) 600 MG tablet Take 1 tablet by mouth twice daily and 3 tablets at bedtime. 08/16/21     HYDROcodone-acetaminophen (NORCO/VICODIN) 5-325 MG tablet Take 1 tablet by mouth every 6 (six) hours as needed for severe pain. 10/01/21   Al DecantGroce, Christopher F, PA-C  HYDROcodone-acetaminophen (NORCO/VICODIN) 5-325 MG tablet Take 1 tablet by mouth 2 (two) times daily as needed for moderate pain. 10/20/21   Particia NearingLane, Rachel Elizabeth, PA-C  ibuprofen (ADVIL) 200 MG tablet Take 800 mg by mouth every 6 (six) hours as needed for mild pain or moderate pain.    [provider]  levocetirizine (XYZAL) 5 MG tablet Take 5 mg by mouth daily.    [provider]  midodrine (PROAMATINE) 5 MG tablet Take 1 tablet by mouth daily as needed. Do not take later than 6pm. Avoid after the evening meal or within 4 hours of bedtime. Patient not taking: Reported on 01/03/2021 11/10/20   Marinus Mawaylor, Gregg W, MD  ondansetron (ZOFRAN ODT) 4 MG disintegrating tablet Take 1 tablet (4 mg total) by mouth every 8 (eight) hours as  needed for nausea or vomiting. 05/31/19   Burgess AmorIdol, Julie, PA-C  orphenadrine (NORFLEX) 100 MG tablet Take 1 tablet (100 mg total) by mouth 2 (two) times daily as needed for muscle spasms. Do not drink alcohol or drive while taking this medication.  May cause drowsiness. 10/20/21   Particia NearingLane, Rachel Elizabeth, PA-C  polyethylene glycol powder [facility](GLYCOLAX/MIRALAX) 17 GM/SCOOP powder Take 17 g by mouth daily as needed for mild constipation.    [provider]  Potassium Chloride ER 20 MEQ TBCR Take 1 tablet by mouth daily.  08/02/21     prednisoLONE acetate (PRED FORTE) 1 % ophthalmic suspension Place 1 drop into both eyes 4 times a day 06/07/21     predniSONE (DELTASONE) 10 MG tablet Take 6 tabs day one, 5 tabs day two, 4 tabs day three, etc 10/20/21   Particia Nearing, PA-C  vortioxetine HBr (TRINTELLIX) 10 MG TABS tablet Take 10 mg by mouth daily.    [provider]  vortioxetine HBr (TRINTELLIX) 10 MG TABS tablet Take 1 tablet by mouth once daily 10/04/20     vortioxetine HBr (TRINTELLIX) 10 MG TABS tablet Take 1 tablet by mouth every day. 10/18/21       Family History Family History  Problem Relation Age of Onset   Cancer Father        prostate   Other Sister        IBS   Other Brother        Lyme Disease   Dementia Maternal Grandmother    Parkinson's disease Maternal Grandfather    Thrombocytopenia Sister     Social History Social History   Tobacco Use   Smoking status: Former    Packs/day: 0.50    Types: Cigarettes, E-cigarettes    Quit date: 02/28/2019    Years since quitting: 2.7   Smokeless tobacco: Never  Vaping Use   Vaping Use: Former  Substance Use Topics   Alcohol use: Yes    Comment: occ   Drug use: No     Allergies   Almond meal, Cymbalta [duloxetine hcl], Sulfa antibiotics, Trazodone and nefazodone, and Sudafed [pseudoephedrine hcl]   Review of Systems Review of Systems Per HPI  Physical Exam Triage Vital Signs ED Triage Vitals  Enc Vitals Group      BP 11/12/21 1502 (!) 146/81     Pulse Rate 11/12/21 1502 93     Resp 11/12/21 1502 16     Temp 11/12/21 1502 (!) 101.7 F (38.7 C)     Temp Source 11/12/21 1502 Oral     SpO2 11/12/21 1502 98 %     Weight --      Height --      Head Circumference --      Peak Flow --      Pain Score 11/12/21 1503 0     Pain Loc --      Pain Edu? --      Excl. in [place]? --    No data found.  Updated Vital Signs BP (!) 146/81 (BP Location: Right Arm)   Pulse 93   Temp (!) 101.7 F (38.7 C) (Oral)   Resp 16   LMP 09/19/2014 Comment: post LAVH  SpO2 98%   Visual Acuity Right Eye Distance:   Left Eye Distance:   Bilateral Distance:    Right Eye Near:   Left Eye Near:    Bilateral Near:     Physical Exam Vitals and nursing note reviewed.  Constitutional:      General: She is not in acute distress.    Appearance: Normal appearance. She is not ill-appearing or toxic-appearing.  HENT:     Head: Normocephalic and atraumatic.     Right Ear: Tympanic membrane, ear canal and external ear normal.     Left Ear: Tympanic membrane, ear canal and external ear normal.     Nose: Congestion present. No rhinorrhea.     Mouth/Throat:     Mouth: Mucous membranes are moist.     Pharynx: Oropharynx is clear. Posterior oropharyngeal  erythema present. No oropharyngeal exudate.     Tonsils: No tonsillar exudate. 1+ on the right. 1+ on the left.  Eyes:     General: No scleral icterus.    Extraocular Movements: Extraocular movements intact.  Cardiovascular:     Rate and Rhythm: Normal rate and regular rhythm.  Pulmonary:     Effort: Pulmonary effort is normal. No respiratory distress.     Breath sounds: Normal breath sounds. No wheezing, rhonchi or rales.  Abdominal:     General: Abdomen is flat. Bowel sounds are normal. There is no distension.     Palpations: Abdomen is soft.  Musculoskeletal:     Cervical back: Normal range of motion and neck supple.  Lymphadenopathy:     Cervical: No cervical  adenopathy.  Skin:    General: Skin is warm and dry.     Capillary Refill: Capillary refill takes less than 2 seconds.     Coloration: Skin is not jaundiced or pale.     Findings: No erythema or rash.  Neurological:     Mental Status: She is alert and oriented to person, place, and time.  Psychiatric:        Behavior: Behavior is cooperative.      UC Treatments / Results  Labs (all labs ordered are listed, but only abnormal results are displayed) Labs Reviewed  RESP PANEL BY RT-PCR (FLU A&B, COVID) [id number]    EKG   Radiology No results found.  Procedures Procedures (including critical care time)  Medications Ordered in UC Medications - No data to display  Initial Impression / Assessment and Plan / UC Course  I have reviewed the triage vital signs and the nursing notes.  Pertinent labs & imaging results that were available during my care of the patient were reviewed by me and considered in my medical decision making (see chart for details).    Patient is well-appearing, not tachycardic, not tachypneic, oxygenating well on room air.  She is slightly febrile at 101 F and slightly hypertensive, likely secondary to feeling poorly with fever.  Symptoms and examination are consistent with viral upper respiratory infection.  COVID-19, influenza testing obtained.  Start tessalon perles as needed for cough.  Supportive care discussed return precautions and ER precautions discussed.  Note given for work.  The patient was given the opportunity to ask questions.  All questions answered to their satisfaction.  The patient is in agreement to this plan.   Final Clinical Impressions(s) / UC Diagnoses   Final diagnoses:  Encounter for screening for COVID-19  Viral URI with cough     Discharge Instructions      Your symptoms and exam findings are most consistent with a viral upper respiratory infection. These usually run their course in about 10 days.  If your symptoms last longer  than 10 days without improvement, please follow up with your primary care provider.  If your symptoms, worsen, please go to the Emergency Room.    We have tested you today for COVID-19 and influenza.  You will see the results in Mychart and we will call you with positive results.    Please stay home and isolate until you are aware of the results.    Some things that can make you feel better are: - Increased rest - Increasing fluid with water/sugar free electrolytes - Acetaminophen and ibuprofen as needed for fever/pain.  - Salt water gargling, chloraseptic spray and throat lozenges - OTC guaifenesin (Mucinex).  - Saline sinus flushes or  a neti pot.  - Humidifying the air. -Cough Perles as needed during the day for dry cough     ED Prescriptions     Medication Sig Dispense Auth. Provider   benzonatate (TESSALON) 100 MG capsule Take 1 capsule (100 mg total) by mouth 3 (three) times daily as needed for cough. Do not take with alcohol or while driving or operating heavy machinery 21 capsule Valentino Nose, NP      PDMP not reviewed this encounter.   Valentino Nose, NP 11/12/21 1520

## 2021-11-12 NOTE — ED Triage Notes (Signed)
Fever today, chest congestion that started today.

## 2021-11-13 LAB — RESP PANEL BY RT-PCR (FLU A&B, COVID) ARPGX2
Influenza A by PCR: NEGATIVE
Influenza B by PCR: NEGATIVE
SARS Coronavirus 2 by RT PCR: POSITIVE — AB

## 2021-11-24 ENCOUNTER — Other Ambulatory Visit (HOSPITAL_COMMUNITY): Payer: Self-pay

## 2021-11-30 ENCOUNTER — Ambulatory Visit (HOSPITAL_COMMUNITY): Admission: RE | Admit: 2021-11-30 | Payer: 59 | Source: Ambulatory Visit

## 2021-11-30 ENCOUNTER — Encounter (HOSPITAL_COMMUNITY): Payer: Self-pay

## 2021-12-13 ENCOUNTER — Other Ambulatory Visit (HOSPITAL_COMMUNITY): Payer: Self-pay

## 2021-12-28 ENCOUNTER — Encounter: Payer: Self-pay | Admitting: Emergency Medicine

## 2021-12-28 ENCOUNTER — Other Ambulatory Visit (HOSPITAL_COMMUNITY): Payer: Self-pay

## 2021-12-28 ENCOUNTER — Ambulatory Visit
Admission: EM | Admit: 2021-12-28 | Discharge: 2021-12-28 | Disposition: A | Discharge status: Home / Self Care | Payer: 59 | Attending: Family Medicine | Admitting: Family Medicine

## 2021-12-28 DIAGNOSIS — M940 Chondrocostal junction syndrome [Tietze]: Secondary | ICD-10-CM | POA: Diagnosis not present

## 2021-12-28 MED ORDER — PREDNISONE 20 MG PO TABS: 40.0000 mg | ORAL_TABLET | Freq: Every day | ORAL | 0 refills | Status: DC

## 2021-12-28 MED ORDER — TIZANIDINE HCL 4 MG PO CAPS
4.0000 mg | ORAL_CAPSULE | Freq: Three times a day (TID) | ORAL | 0 refills | Status: DC | PRN
Start: 2021-12-28 — End: 2022-05-11

## 2021-12-28 NOTE — ED Triage Notes (Signed)
Woke up today and having pain around rib cage area.

## 2021-12-28 NOTE — ED Provider Notes (Signed)
[facility]    CSN: [id number] Arrival date & time: 12/28/21  1907      History   Chief Complaint No chief complaint on file.   HPI Rachel Griffith is a 36 y.o. female.   Patient presenting today with about 1 week of pain wrapping around her lower rib cage, worse with movement or deep breathing.  States symptoms started after a somewhat strenuous day.  Symptoms significantly worsened today despite heat, rest, ibuprofen, IcyHot with minimal relief.  Has a history of costochondritis that felt just like this in the past.  Denies chest pain, shortness of breath, wheezing, headache, dizziness, injury.    Past Medical History:  Diagnosis Date   Anxiety    Back injury    Chronic pelvic pain in female    Endometriosis    Endometriosis determined by laparoscopy 10/06/2014   Ovarian cyst    Pelvic pain in female 10/06/2014   PONV (postoperative nausea and vomiting)    pt states that with 1st arthroscopy she "passed out and couldnt left her head" but they tell me it was a combination of the pain meds and anesthesia" No problems with next surgery. Not sure of meds given in anesthesia.   POTS (postural orthostatic tachycardia syndrome)    pt reports this was diagnosed a couple of months ago by PCP (Dr Annia Friendly)   PTSD (post-traumatic stress disorder)     Patient Active Problem List   Diagnosis Date Noted   Headache 07/10/2020   Left ovarian cyst 07/31/2018   Ovarian cyst, bilateral 02/08/2016   Postoperative abdominal pain 11/24/2014   Status post laparoscopic assisted vaginal hysterectomy (LAVH) 11/23/2014   Pelvic pain in female 10/06/2014   Endometriosis determined by laparoscopy 10/06/2014    Past Surgical History:  Procedure Laterality Date   ABDOMINAL HYSTERECTOMY     endometrosis  2014   [facility]geration-[facility]   KNEE SURGERY Bilateral    x2   LAPAROSCOPIC BILATERAL SALPINGO OOPHERECTOMY Left 07/31/2018   Procedure: LAPAROSCOPIC LEFT SALPINGO OOPHORECTOMY;   Surgeon: Lazaro Arms, MD;  Location: [facility];  Service: Gynecology;  Laterality: Left;   LAPAROSCOPY N/A 11/26/2014   Procedure: DIAGNOSTIC LAPAROSCOPY;  Surgeon: [name], MD;  Location: [facility];  Service: Gynecology;  Laterality: N/A;   MICRODISCECTOMY LUMBAR  07/01/2020    OB History     Gravida  2   Para  2   Term  2   Preterm      AB      Living  2      SAB      IAB      Ectopic      Multiple      Live Births               Home Medications    Prior to Admission medications   Medication Sig Start Date End Date Taking? Authorizing Provider  predniSONE (DELTASONE) 20 MG tablet Take 2 tablets (40 mg total) by mouth daily with breakfast. 12/28/21  Yes Particia Nearing, PA-C  tiZANidine (ZANAFLEX) 4 MG capsule Take 1 capsule (4 mg total) by mouth 3 (three) times daily as needed for muscle spasms. Do not drink alcohol or drive while taking this medication.  May cause drowsiness. 12/28/21  Yes Particia Nearing, PA-C  albuterol (VENTOLIN HFA) 108 (90 Base) MCG/ACT inhaler Inhale 2 puffs into the lungs every 4 (four) hours as needed for wheezing or shortness of breath. 04/19/19   [provider]  albuterol (VENTOLIN HFA) 108 (90 Base) MCG/ACT inhaler INHALE 2 PUFFS BY MOUTH EVERY 4 HOURS AS NEEDED 11/21/20     ALPRAZolam (XANAX) 0.5 MG tablet Take 0.5 mg by mouth at bedtime.    [provider]  ALPRAZolam Prudy Feeler) 0.5 MG tablet Take 1 tablet by mouth every day as needed for anxiety. 10/05/20   Nita Sells, MD  ALPRAZolam Prudy Feeler) 0.5 MG tablet TAKE 1 TABLET BY MOUTH EVERY DAY AS NEEDED FOR ANXIETY 09/11/21     amphetamine-dextroamphetamine (ADDERALL XR) 25 MG 24 hr capsule Take 1 capsule by mouth daily for 30 days. 11/02/21     amphetamine-dextroamphetamine (ADDERALL XR) 30 MG 24 hr capsule Take 1 capsule (30 mg total) by mouth daily. 08/02/21     amphetamine-dextroamphetamine (ADDERALL) 20 MG tablet Take 20 mg by mouth 2 (two) times daily.     [provider]  amphetamine-dextroamphetamine (ADDERALL) 20 MG tablet TAKE 1 TABLET BY MOUTH TWICE A DAY 12/26/20     benzonatate (TESSALON) 100 MG capsule Take 1 capsule (100 mg total) by mouth 3 (three) times daily as needed for cough. Do not take with alcohol or while driving or operating heavy machinery 11/12/21   Valentino Nose, NP  cyclobenzaprine (FLEXERIL) 10 MG tablet Take 1 tablet (10 mg total) by mouth 2 (two) times daily as needed for muscle spasms. 10/01/21   Al Decant, PA-C  desogestrel-ethinyl estradiol (CYRED EQ) 0.15-30 MG-MCG tablet Take 1 tablet by mouth daily 01/25/21   Lazaro Arms, MD  doxycycline (ADOXA) 50 MG tablet Take 1 tablet by mouth twice a day 06/22/21     erythromycin ophthalmic ointment Place a 1/2 inch ribbon of ointment into the lower eyelid. 04/29/21   Dahlia Byes A, NP  fludrocortisone (FLORINEF) 0.1 MG tablet Take 1 tablet by mouth 2  times daily. 01/03/21   Marinus Maw, MD  gabapentin (NEURONTIN) 600 MG tablet Take 600-1,800 mg by mouth See admin instructions. 600 mg bid, 1800 mg at bedtime    [provider]  gabapentin (NEURONTIN) 600 MG tablet Take 1 tablet by mouth twice daily and 3 tablets at bedtime. 08/16/21     HYDROcodone-acetaminophen (NORCO/VICODIN) 5-325 MG tablet Take 1 tablet by mouth every 6 (six) hours as needed for severe pain. 10/01/21   Al Decant, PA-C  HYDROcodone-acetaminophen (NORCO/VICODIN) 5-325 MG tablet Take 1 tablet by mouth 2 (two) times daily as needed for moderate pain. 10/20/21   Particia Nearing, PA-C  ibuprofen (ADVIL) 200 MG tablet Take 800 mg by mouth every 6 (six) hours as needed for mild pain or moderate pain.    [provider]  levocetirizine (XYZAL) 5 MG tablet Take 5 mg by mouth daily.    [provider]  midodrine (PROAMATINE) 5 MG tablet Take 1 tablet by mouth daily as needed. Do not take later than 6pm. Avoid after the evening meal or within 4 hours of  bedtime. Patient not taking: Reported on 01/03/2021 11/10/20   Marinus Maw, MD  ondansetron (ZOFRAN ODT) 4 MG disintegrating tablet Take 1 tablet (4 mg total) by mouth every 8 (eight) hours as needed for nausea or vomiting. 05/31/19   Burgess Amor, PA-C  orphenadrine (NORFLEX) 100 MG tablet Take 1 tablet (100 mg total) by mouth 2 (two) times daily as needed for muscle spasms. Do not drink alcohol or drive while taking this medication.  May cause drowsiness. 10/20/21   Particia Nearing, PA-C  polyethylene glycol powder (  GLYCOLAX/MIRALAX) 17 GM/SCOOP powder Take 17 g by mouth daily as needed for mild constipation.    [provider]  Potassium Chloride ER 20 MEQ TBCR Take 1 tablet by mouth daily. 08/02/21     prednisoLONE acetate (PRED FORTE) 1 % ophthalmic suspension Place 1 drop into both eyes 4 times a day 06/07/21     predniSONE (DELTASONE) 10 MG tablet Take 6 tabs day one, 5 tabs day two, 4 tabs day three, etc 10/20/21   Volney American, PA-C  vortioxetine HBr (TRINTELLIX) 10 MG TABS tablet Take 10 mg by mouth daily.    [provider]  vortioxetine HBr (TRINTELLIX) 10 MG TABS tablet Take 1 tablet by mouth once daily 10/04/20     vortioxetine HBr (TRINTELLIX) 10 MG TABS tablet Take 1 tablet by mouth every day. 10/18/21       Family History Family History  Problem Relation Age of Onset   Cancer Father        prostate   Other Sister        IBS   Other Brother        Lyme Disease   Dementia Maternal Grandmother    Parkinson's disease Maternal Grandfather    Thrombocytopenia Sister     Social History Social History   Tobacco Use   Smoking status: Former    Packs/day: 0.50    Types: Cigarettes, E-cigarettes    Quit date: 02/28/2019    Years since quitting: 2.8   Smokeless tobacco: Never  Vaping Use   Vaping Use: Former  Substance Use Topics   Alcohol use: Yes    Comment: occ   Drug use: No     Allergies   Almond meal, Cymbalta [duloxetine hcl], Sulfa  antibiotics, Trazodone and nefazodone, and Sudafed [pseudoephedrine hcl]   Review of Systems Review of Systems PER HPI  Physical Exam Triage Vital Signs ED Triage Vitals [12/28/21 1913]  Enc Vitals Group     BP 132/77     Pulse Rate 81     Resp 18     Temp 98.1 F (36.7 C)     Temp Source Oral     SpO2 99 %     Weight      Height      Head Circumference      Peak Flow      Pain Score 5     Pain Loc      Pain Edu?      Excl. in [place]?    No data found.  Updated Vital Signs BP 132/77 (BP Location: Right Arm)   Pulse 81   Temp 98.1 F (36.7 C) (Oral)   Resp 18   LMP 09/19/2014 Comment: post LAVH  SpO2 99%   Visual Acuity Right Eye Distance:   Left Eye Distance:   Bilateral Distance:    Right Eye Near:   Left Eye Near:    Bilateral Near:     Physical Exam Vitals and nursing note reviewed.  Constitutional:      Appearance: Normal appearance. She is not ill-appearing.  HENT:     Head: Atraumatic.     Mouth/Throat:     Mouth: Mucous membranes are moist.  Eyes:     Extraocular Movements: Extraocular movements intact.     Conjunctiva/sclera: Conjunctivae normal.  Cardiovascular:     Rate and Rhythm: Normal rate and regular rhythm.     Heart sounds: Normal heart sounds.  Pulmonary:     Effort: Pulmonary effort  is normal.     Breath sounds: Normal breath sounds. No wheezing or rales.     Comments: Chest rise symmetric bilaterally Musculoskeletal:        General: Tenderness present. No deformity. Normal range of motion.     Cervical back: Normal range of motion and neck supple.     Comments: Mild tenderness to palpation diffusely across lower ribs bilaterally.  Skin:    General: Skin is warm and dry.  Neurological:     Mental Status: She is alert and oriented to person, place, and time.  Psychiatric:        Mood and Affect: Mood normal.        Thought Content: Thought content normal.        Judgment: Judgment normal.      UC Treatments / Results   Labs (all labs ordered are listed, but only abnormal results are displayed) Labs Reviewed - No data to display  EKG   Radiology No results found.  Procedures Procedures (including critical care time)  Medications Ordered in UC Medications - No data to display  Initial Impression / Assessment and Plan / UC Course  I have reviewed the triage vital signs and the nursing notes.  Pertinent labs & imaging results that were available during my care of the patient were reviewed by me and considered in my medical decision making (see chart for details).     Consistent with costochondritis, treat with prednisone, Zanaflex as 1 week of over-the-counter anti-inflammatories and supportive home care has not improved symptoms.  Return for worsening symptoms.  Final Clinical Impressions(s) / UC Diagnoses   Final diagnoses:  Costochondritis   Discharge Instructions   None    ED Prescriptions     Medication Sig Dispense Auth. Provider   predniSONE (DELTASONE) 20 MG tablet Take 2 tablets (40 mg total) by mouth daily with breakfast. 10 tablet Particia Nearing, PA-C   tiZANidine (ZANAFLEX) 4 MG capsule Take 1 capsule (4 mg total) by mouth 3 (three) times daily as needed for muscle spasms. Do not drink alcohol or drive while taking this medication.  May cause drowsiness. 15 capsule Particia Nearing, [place]      PDMP not reviewed this encounter.   Particia Nearing, [place] 12/28/21 1929

## 2021-12-29 ENCOUNTER — Other Ambulatory Visit (HOSPITAL_COMMUNITY): Payer: Self-pay

## 2021-12-29 MED ORDER — ALPRAZOLAM 0.5 MG PO TABS
0.5000 mg | ORAL_TABLET | Freq: Every day | ORAL | 2 refills | Status: DC | PRN
  Filled 2021-12-29: qty 30, 30d supply, fill #0
  Filled 2022-02-01: qty 30, 30d supply, fill #1
  Filled 2022-03-06: qty 30, 30d supply, fill #2

## 2022-01-12 ENCOUNTER — Other Ambulatory Visit (HOSPITAL_COMMUNITY): Payer: Self-pay

## 2022-01-12 MED ORDER — AMPHETAMINE-DEXTROAMPHET ER 25 MG PO CP24
25.0000 mg | ORAL_CAPSULE | Freq: Every day | ORAL | 0 refills | Status: AC
  Filled 2022-01-12: qty 30, 30d supply, fill #0

## 2022-01-22 ENCOUNTER — Other Ambulatory Visit (HOSPITAL_COMMUNITY): Payer: Self-pay

## 2022-01-23 ENCOUNTER — Other Ambulatory Visit (HOSPITAL_COMMUNITY): Payer: Self-pay

## 2022-01-23 MED ORDER — GABAPENTIN 600 MG PO TABS
ORAL_TABLET | ORAL | 3 refills | Status: DC
  Filled 2022-01-23: qty 150, 30d supply, fill #0
  Filled 2022-03-01: qty 150, 30d supply, fill #1
  Filled 2022-04-04: qty 150, 30d supply, fill #2
  Filled 2022-05-20: qty 150, 30d supply, fill #3

## 2022-01-24 ENCOUNTER — Other Ambulatory Visit (HOSPITAL_COMMUNITY): Payer: Self-pay

## 2022-01-30 ENCOUNTER — Other Ambulatory Visit (HOSPITAL_COMMUNITY): Payer: Self-pay

## 2022-01-31 ENCOUNTER — Other Ambulatory Visit (HOSPITAL_COMMUNITY): Payer: Self-pay

## 2022-01-31 MED ORDER — TRINTELLIX 10 MG PO TABS
10.0000 mg | ORAL_TABLET | Freq: Every day | ORAL | 2 refills | Status: DC
  Filled 2022-01-31: qty 90, 90d supply, fill #0
  Filled 2022-05-09 – 2022-05-10 (×2): qty 90, 90d supply, fill #1
  Filled 2022-06-09 – 2022-08-13 (×2): qty 90, 90d supply, fill #2

## 2022-02-02 ENCOUNTER — Other Ambulatory Visit (HOSPITAL_COMMUNITY): Payer: Self-pay

## 2022-02-09 ENCOUNTER — Other Ambulatory Visit: Payer: Self-pay | Admitting: Internal Medicine

## 2022-02-09 ENCOUNTER — Other Ambulatory Visit: Payer: Self-pay

## 2022-02-09 ENCOUNTER — Other Ambulatory Visit (HOSPITAL_COMMUNITY): Payer: Self-pay

## 2022-02-09 MED ORDER — FLUDROCORTISONE ACETATE 0.1 MG PO TABS
0.1000 mg | ORAL_TABLET | Freq: Two times a day (BID) | ORAL | 3 refills | Status: DC
Start: 2022-02-09 — End: 2023-01-08
  Filled 2022-02-09 – 2022-03-01 (×2): qty 180, 90d supply, fill #0
  Filled 2022-07-15: qty 180, 90d supply, fill #1
  Filled 2022-10-19: qty 180, 90d supply, fill #2

## 2022-02-20 ENCOUNTER — Other Ambulatory Visit (HOSPITAL_COMMUNITY): Payer: Self-pay

## 2022-03-01 ENCOUNTER — Other Ambulatory Visit: Payer: Self-pay

## 2022-03-01 ENCOUNTER — Other Ambulatory Visit (HOSPITAL_COMMUNITY): Payer: Self-pay

## 2022-03-02 ENCOUNTER — Other Ambulatory Visit (HOSPITAL_COMMUNITY): Payer: Self-pay

## 2022-03-02 MED ORDER — AMPHETAMINE-DEXTROAMPHET ER 30 MG PO CP24
ORAL_CAPSULE | ORAL | 0 refills | Status: DC
  Filled 2022-03-02: qty 30, 30d supply, fill #0

## 2022-03-02 MED ORDER — AMPHETAMINE-DEXTROAMPHET ER 25 MG PO CP24
25.0000 mg | ORAL_CAPSULE | Freq: Every day | ORAL | 0 refills | Status: DC
  Filled 2022-03-02: qty 30, 30d supply, fill #0

## 2022-03-06 ENCOUNTER — Other Ambulatory Visit: Payer: Self-pay

## 2022-03-09 DIAGNOSIS — Z0000 Encounter for general adult medical examination without abnormal findings: Secondary | ICD-10-CM | POA: Diagnosis not present

## 2022-04-04 ENCOUNTER — Other Ambulatory Visit (HOSPITAL_COMMUNITY): Payer: Self-pay

## 2022-04-04 MED ORDER — ALPRAZOLAM 0.5 MG PO TABS
ORAL_TABLET | ORAL | 2 refills | Status: DC
  Filled 2022-04-04: qty 30, 30d supply, fill #0
  Filled 2022-05-04 (×2): qty 30, 30d supply, fill #1
  Filled 2022-06-02: qty 30, 30d supply, fill #2

## 2022-04-17 ENCOUNTER — Other Ambulatory Visit (HOSPITAL_COMMUNITY): Payer: Self-pay

## 2022-04-26 ENCOUNTER — Encounter: Payer: Self-pay | Admitting: Radiology

## 2022-04-27 ENCOUNTER — Other Ambulatory Visit (HOSPITAL_COMMUNITY): Payer: Self-pay

## 2022-04-27 ENCOUNTER — Other Ambulatory Visit: Payer: Self-pay | Admitting: Obstetrics & Gynecology

## 2022-04-27 MED ORDER — DESOGESTREL-ETHINYL ESTRADIOL 0.15-30 MG-MCG PO TABS
1.0000 | ORAL_TABLET | Freq: Every day | ORAL | 4 refills | Status: DC
  Filled 2022-04-27: qty 112, 84d supply, fill #0

## 2022-04-29 ENCOUNTER — Ambulatory Visit
Admission: EM | Admit: 2022-04-29 | Discharge: 2022-04-29 | Disposition: A | Discharge status: Home / Self Care | Payer: Commercial Managed Care - PPO | Attending: Nurse Practitioner | Admitting: Nurse Practitioner

## 2022-04-29 DIAGNOSIS — M542 Cervicalgia: Secondary | ICD-10-CM | POA: Diagnosis not present

## 2022-04-29 MED ORDER — METHOCARBAMOL 500 MG PO TABS: 500.0000 mg | ORAL_TABLET | Freq: Two times a day (BID) | ORAL | 0 refills | Status: DC

## 2022-04-29 MED ORDER — KETOROLAC TROMETHAMINE 30 MG/ML IJ SOLN
30.0000 mg | Freq: Once | INTRAMUSCULAR | Status: AC
  Administered 2022-04-29: 30 mg via INTRAMUSCULAR

## 2022-04-29 MED ORDER — PREDNISONE 20 MG PO TABS: 40.0000 mg | ORAL_TABLET | Freq: Every day | ORAL | 0 refills | Status: AC

## 2022-04-29 MED ORDER — DEXAMETHASONE SODIUM PHOSPHATE 10 MG/ML IJ SOLN
10.0000 mg | INTRAMUSCULAR | Status: AC
  Administered 2022-04-29: 10 mg via INTRAMUSCULAR

## 2022-04-29 NOTE — ED Provider Notes (Signed)
[facility]    CSN: [id number] Arrival date & time: 04/29/22  1040      History   Chief Complaint Chief Complaint  Patient presents with   Back Pain         HPI Rachel Griffith is a 37 y.o. female.   The history is provided by the patient.   The patient presents for complaints of neck pain has been present for the past 3 to 4 days.  Patient states that she felt symptoms when she was at work, but did not think much of it.  The next day, she noticed neck pain when she was brushing her teeth.  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  She states that she felt that after she work with the patient that evening, she had worsening neck pain.  She states that she experiences pain with turning her head from side-to-side and looking upward.  She states that the pain does radiate into the mid back, denies numbness or tingling to the upper extremities or headache.  Patient states that she has been taking ibuprofen and methocarbamol with some relief.  Patient reports history of degenerative spine disease status post microsurgery of her spine degenerative spine disease status post microsurgery of her spine.   Past Medical History:  Diagnosis Date   Anxiety    Back injury    Chronic pelvic pain in female    Endometriosis    Endometriosis determined by laparoscopy 10/06/2014   Ovarian cyst    Pelvic pain in female 10/06/2014   PONV (postoperative nausea and vomiting)    pt states that with 1st arthroscopy she "passed out and couldnt left her head" but they tell me it was a combination of the pain meds and anesthesia" No problems with next surgery. Not sure of meds given in anesthesia.   POTS (postural orthostatic tachycardia syndrome)    pt reports this was diagnosed a couple of months ago by PCP (Dr Higinio Plan)   PTSD (post-traumatic stress disorder)     Patient Active Problem List   Diagnosis Date Noted   Headache  07/10/2020   Left ovarian cyst 07/31/2018   Ovarian cyst, bilateral 02/08/2016   Postoperative abdominal pain 11/24/2014   Status post laparoscopic assisted vaginal hysterectomy (LAVH) 11/23/2014   Pelvic pain in female 10/06/2014   Endometriosis determined by laparoscopy 10/06/2014    Past Surgical History:  Procedure Laterality Date   ABDOMINAL HYSTERECTOMY     endometrosis  2014   fulgeration-[facility]   KNEE SURGERY Bilateral    x2   LAPAROSCOPIC BILATERAL SALPINGO OOPHERECTOMY Left 07/31/2018   Procedure: LAPAROSCOPIC LEFT SALPINGO OOPHORECTOMY;  Surgeon: Florian Buff, MD;  Location: [facility];  Service: Gynecology;  Laterality: Left;   LAPAROSCOPY N/A 11/26/2014   Procedure: DIAGNOSTIC LAPAROSCOPY;  Surgeon: Jonnie Kind, MD;  Location: [facility];  Service: Gynecology;  Laterality: N/A;   MICRODISCECTOMY LUMBAR  07/01/2020    OB History     Gravida  2   Para  2   Term  2   Preterm      AB      Living  2      SAB      IAB      Ectopic      Multiple      Live Births               Home Medications  Prior to Admission medications   Medication Sig Start Date End Date Taking? Authorizing Provider  methocarbamol (ROBAXIN) 500 MG tablet Take 1 tablet (500 mg total) by mouth 2 (two) times daily. 04/29/22  Yes Memorie Yokoyama-Warren, Alda Lea, NP  predniSONE (DELTASONE) 20 MG tablet Take 2 tablets (40 mg total) by mouth daily with breakfast for 5 days. 04/29/22 05/04/22 Yes Ladonne Sharples-Warren, Alda Lea, NP  albuterol (VENTOLIN HFA) 108 (90 Base) MCG/ACT inhaler Inhale 2 puffs into the lungs every 4 (four) hours as needed for wheezing or shortness of breath. 04/19/19   [provider]  albuterol (VENTOLIN HFA) 108 (90 Base) MCG/ACT inhaler INHALE 2 PUFFS BY MOUTH EVERY 4 HOURS AS NEEDED 11/21/20     ALPRAZolam (XANAX) 0.5 MG tablet Take 0.5 mg by mouth at bedtime.    [provider]  ALPRAZolam Duanne Moron) 0.5 MG tablet Take 1 tablet by mouth every day as needed for  anxiety. 10/05/20   Allyn Kenner, MD  ALPRAZolam Duanne Moron) 0.5 MG tablet Take 1 tablet (0.5 mg total) by mouth daily as needed for anxiety 04/04/22     amphetamine-dextroamphetamine (ADDERALL XR) 25 MG 24 hr capsule Take 1 capsule by mouth daily. 01/11/22     amphetamine-dextroamphetamine (ADDERALL XR) 25 MG 24 hr capsule Take 1 capsule by mouth daily. 03/02/22     amphetamine-dextroamphetamine (ADDERALL XR) 30 MG 24 hr capsule Take 1 capsule (30 mg total) by mouth daily. 08/02/21     amphetamine-dextroamphetamine (ADDERALL) 20 MG tablet Take 20 mg by mouth 2 (two) times daily.    [provider]  amphetamine-dextroamphetamine (ADDERALL) 20 MG tablet TAKE 1 TABLET BY MOUTH TWICE A DAY 12/26/20     benzonatate (TESSALON) 100 MG capsule Take 1 capsule (100 mg total) by mouth 3 (three) times daily as needed for cough. Do not take with alcohol or while driving or operating heavy machinery 11/12/21   Eulogio Bear, NP  cyclobenzaprine (FLEXERIL) 10 MG tablet Take 1 tablet (10 mg total) by mouth 2 (two) times daily as needed for muscle spasms. 10/01/21   Azucena Cecil, PA-C  desogestrel-ethinyl estradiol (CYRED EQ) 0.15-30 MG-MCG tablet Take 1 tablet by mouth daily. Take one by mouth daily, skip placebo pills. 04/27/22   Florian Buff, MD  doxycycline (ADOXA) 50 MG tablet Take 1 tablet by mouth twice a day 06/22/21     erythromycin ophthalmic ointment Place a 1/2 inch ribbon of ointment into the lower eyelid. 04/29/21   Loura Halt A, NP  fludrocortisone (FLORINEF) 0.1 MG tablet Take 1 tablet (0.1 mg total) by mouth 2 (two) times daily. 02/09/22   Evans Lance, MD  gabapentin (NEURONTIN) 600 MG tablet Take 600-1,800 mg by mouth See admin instructions. 600 mg bid, 1800 mg at bedtime    [provider]  gabapentin (NEURONTIN) 600 MG tablet Take 1 tablet (600 mg total) by mouth 2 (two) times daily AND 3 tablets (1,800 mg total) at bedtime. 01/23/22     HYDROcodone-acetaminophen (NORCO/VICODIN)  5-325 MG tablet Take 1 tablet by mouth every 6 (six) hours as needed for severe pain. 10/01/21   Azucena Cecil, PA-C  HYDROcodone-acetaminophen (NORCO/VICODIN) 5-325 MG tablet Take 1 tablet by mouth 2 (two) times daily as needed for moderate pain. 10/20/21   Volney American, PA-C  ibuprofen (ADVIL) 200 MG tablet Take 800 mg by mouth every 6 (six) hours as needed for mild pain or moderate pain.    [provider]  levocetirizine (XYZAL) 5 MG tablet  Take 5 mg by mouth daily.    [provider]  midodrine (PROAMATINE) 5 MG tablet Take 1 tablet by mouth daily as needed. Do not take later than 6pm. Avoid after the evening meal or within 4 hours of bedtime. Patient not taking: Reported on 01/03/2021 11/10/20   Evans Lance, MD  ondansetron (ZOFRAN ODT) 4 MG disintegrating tablet Take 1 tablet (4 mg total) by mouth every 8 (eight) hours as needed for nausea or vomiting. 05/31/19   Evalee Jefferson, PA-C  orphenadrine (NORFLEX) 100 MG tablet Take 1 tablet (100 mg total) by mouth 2 (two) times daily as needed for muscle spasms. Do not drink alcohol or drive while taking this medication.  May cause drowsiness. 10/20/21   Volney American, PA-C  polyethylene glycol powder [facility]) 17 GM/SCOOP powder Take 17 g by mouth daily as needed for mild constipation.    [provider]  Potassium Chloride ER 20 MEQ TBCR Take 1 tablet by mouth daily. 08/02/21     prednisoLONE acetate (PRED FORTE) 1 % ophthalmic suspension Place 1 drop into both eyes 4 times a day 06/07/21     tiZANidine (ZANAFLEX) 4 MG capsule Take 1 capsule (4 mg total) by mouth 3 (three) times daily as needed for muscle spasms. Do not drink alcohol or drive while taking this medication.  May cause drowsiness. 12/28/21   Volney American, PA-C  vortioxetine HBr (TRINTELLIX) 10 MG TABS tablet Take 10 mg by mouth daily.    [provider]  vortioxetine HBr (TRINTELLIX) 10 MG TABS tablet Take 1 tablet by  mouth once daily 10/04/20     vortioxetine HBr (TRINTELLIX) 10 MG TABS tablet Take 1 tablet (10 mg total) by mouth daily. 01/31/22       Family History Family History  Problem Relation Age of Onset   Cancer Father        prostate   Other Sister        IBS   Other Brother        Lyme Disease   Dementia Maternal Grandmother    Parkinson's disease Maternal Grandfather    Thrombocytopenia Sister     Social History Social History   Tobacco Use   Smoking status: Former    Packs/day: 0.50    Types: Cigarettes, E-cigarettes    Quit date: 02/28/2019    Years since quitting: 3.1   Smokeless tobacco: Never  Vaping Use   Vaping Use: Former  Substance Use Topics   Alcohol use: Yes    Comment: occ   Drug use: No     Allergies   Almond meal, Cymbalta [duloxetine hcl], Duloxetine, Other, Sulfa antibiotics, Trazodone and nefazodone, and Sudafed [pseudoephedrine hcl]   Review of Systems Review of Systems Per HPI  Physical Exam Triage Vital Signs ED Triage Vitals  Enc Vitals Group     BP 04/29/22 1044 (!) 141/87     Pulse Rate 04/29/22 1044 71     Resp 04/29/22 1044 16     Temp 04/29/22 1044 97.9 F (36.6 C)     Temp Source 04/29/22 1044 Oral     SpO2 04/29/22 1044 98 %     Weight --      Height --      Head Circumference --      Peak Flow --      Pain Score 04/29/22 1047 8     Pain Loc --      Pain Edu? --  Excl. in GC? --    No data found.  Updated Vital Signs BP (!) 141/87 (BP Location: Right Arm)   Pulse 71   Temp 97.9 F (36.6 C) (Oral)   Resp 16   LMP 09/19/2014 Comment: post LAVH  SpO2 98%   Visual Acuity Right Eye Distance:   Left Eye Distance:   Bilateral Distance:    Right Eye Near:   Left Eye Near:    Bilateral Near:     Physical Exam Vitals and nursing note reviewed.  Constitutional:      General: She is not in acute distress.    Appearance: Normal appearance.  HENT:     Head: Normocephalic.  Eyes:     Extraocular Movements:  Extraocular movements intact.     Pupils: Pupils are equal, round, and reactive to light.  Neck:     Comments: Decreased range of motion with right lateral rotation and flexion. Cardiovascular:     Rate and Rhythm: Normal rate and regular rhythm.     Pulses: Normal pulses.     Heart sounds: Normal heart sounds.  Pulmonary:     Effort: Pulmonary effort is normal.     Breath sounds: Normal breath sounds.  Abdominal:     General: Bowel sounds are normal.     Palpations: Abdomen is soft.  Musculoskeletal:     Cervical back: Pain with movement present. No spinous process tenderness or muscular tenderness.  Skin:    General: Skin is warm and dry.  Neurological:     General: No focal deficit present.     Mental Status: She is alert and oriented to person, place, and time.  Psychiatric:        Mood and Affect: Mood normal.        Behavior: Behavior normal.      UC Treatments / Results  Labs (all labs ordered are listed, but only abnormal results are displayed) Labs Reviewed - No data to display  EKG   Radiology No results found.  Procedures Procedures (including critical care time)  Medications Ordered in UC Medications  ketorolac (TORADOL) 30 MG/ML injection 30 mg (has no administration in time range)  dexamethasone (DECADRON) injection 10 mg (has no administration in time range)    Initial Impression / Assessment and Plan / UC Course  I have reviewed the triage vital signs and the nursing notes.  Pertinent labs & imaging results that were available during my care of the patient were reviewed by me and considered in my medical decision making (see chart for details).  The patient is well-appearing, she is in no acute distress, vital signs are stable.  Patient presents with neck pain that is been present for the past 4 days.  Patient denies radiculopathy at this time.  Will treat with Decadron 10 mg IM and Toradol 30 mg IM in the clinic.  Will start patient on  prednisone 40 mg for the next 5 days to help with inflammation and methocarbamol 500 mg to act as a muscle relaxer.  Supportive care recommendations were provided to the patient to include gentle range of motion, use of ice or heat, and continuing Tylenol while taking prednisone, she can initiate ibuprofen when she has completed the prednisone.  Patient advised to follow-up with her neurosurgeon if symptoms fail to improve.  Patient is in agreement with this plan of care and verbalizes understanding.  All questions were answered.  Patient stable for discharge.    Final Clinical Impressions(s) / UC  Diagnoses   Final diagnoses:  Neck pain     Discharge Instructions      Take medication as prescribed. Increase fluids and allow for plenty of rest. Recommend Tylenol arthritis strength 650 mg tablets while taking the prednisone for neck pain or discomfort.  Once you have completed the prednisone, you can resume ibuprofen. Gentle range of motion exercises while symptoms persist. Continue the use of ice or heat as needed.  Apply ice for pain or swelling, heat for spasm or stiffness.  Apply for 20 minutes, remove for 1 hour, then repeat is much as possible. If you develop numbness, tingling, or increased pain, please follow-up in the emergency department or with your neurosurgeon for further evaluation. Follow-up as needed.     ED Prescriptions     Medication Sig Dispense Auth. Provider   methocarbamol (ROBAXIN) 500 MG tablet Take 1 tablet (500 mg total) by mouth 2 (two) times daily. 20 tablet Robbi Spells-Warren, Alda Lea, NP   predniSONE (DELTASONE) 20 MG tablet Take 2 tablets (40 mg total) by mouth daily with breakfast for 5 days. 10 tablet Denys Labree-Warren, Alda Lea, NP      PDMP not reviewed this encounter.   Tish Men, NP 04/29/22 1103

## 2022-04-29 NOTE — Discharge Instructions (Signed)
Take medication as prescribed. Increase fluids and allow for plenty of rest. Recommend Tylenol arthritis strength 650 mg tablets while taking the prednisone for neck pain or discomfort.  Once you have completed the prednisone, you can resume ibuprofen. Gentle range of motion exercises while symptoms persist. Continue the use of ice or heat as needed.  Apply ice for pain or swelling, heat for spasm or stiffness.  Apply for 20 minutes, remove for 1 hour, then repeat is much as possible. If you develop numbness, tingling, or increased pain, please follow-up in the emergency department or with your neurosurgeon for further evaluation. Follow-up as needed.

## 2022-04-29 NOTE — ED Triage Notes (Signed)
Pt reports neck pain x 4 days. Pain started when she was brushing her teeth. Reports when she moves her neck she feels pain radiates to back.  Ibuprofen and methocarbamol gives some relief.

## 2022-05-02 ENCOUNTER — Other Ambulatory Visit (HOSPITAL_COMMUNITY): Payer: Self-pay

## 2022-05-02 MED ORDER — AMPHETAMINE-DEXTROAMPHET ER 25 MG PO CP24
25.0000 mg | ORAL_CAPSULE | Freq: Every day | ORAL | 0 refills | Status: DC
  Filled 2022-05-02: qty 30, 30d supply, fill #0

## 2022-05-04 ENCOUNTER — Other Ambulatory Visit (HOSPITAL_COMMUNITY): Payer: Self-pay

## 2022-05-10 ENCOUNTER — Other Ambulatory Visit (HOSPITAL_COMMUNITY): Payer: Self-pay

## 2022-05-11 ENCOUNTER — Ambulatory Visit: Payer: Commercial Managed Care - PPO | Admitting: Obstetrics & Gynecology

## 2022-05-11 ENCOUNTER — Encounter: Payer: Self-pay | Admitting: Obstetrics & Gynecology

## 2022-05-11 ENCOUNTER — Other Ambulatory Visit (HOSPITAL_COMMUNITY): Payer: Self-pay

## 2022-05-11 VITALS — BP 131/83 | HR 63 | Ht 69.0 in | Wt 160.0 lb

## 2022-05-11 DIAGNOSIS — Z79890 Hormone replacement therapy (postmenopausal): Secondary | ICD-10-CM

## 2022-05-11 MED ORDER — DESOGESTREL-ETHINYL ESTRADIOL 0.15-30 MG-MCG PO TABS
1.0000 | ORAL_TABLET | Freq: Every day | ORAL | 4 refills | Status: DC
  Filled 2022-05-11: qty 84, 84d supply, fill #0
  Filled 2022-07-30: qty 112, 84d supply, fill #0
  Filled 2022-10-19: qty 112, 84d supply, fill #1
  Filled 2023-01-29: qty 112, 84d supply, fill #2
  Filled 2023-04-29: qty 84, 63d supply, fill #3

## 2022-05-11 NOTE — Progress Notes (Signed)
Follow up appointment for results: HRT  Chief Complaint  Patient presents with   Medication Refill    Apri    Blood pressure 131/83, pulse 63, height 5\' 9"  (1.753 m), weight 160 lb (72.6 kg), last menstrual period 09/19/2014.    S/P BSO and hysterectomy in separate surgeries, doing well on continueous COC, does not do well on off weeks so takes continuously as prescribed No associated complaints  MEDS ordered this encounter: Meds ordered this encounter  Medications   desogestrel-ethinyl estradiol (CYRED EQ) 0.15-30 MG-MCG tablet    Sig: Take 1 tablet by mouth daily. Take one by mouth daily, skip placebo pills.    Dispense:  84 tablet    Refill:  4    Orders for this encounter: No orders of the defined types were placed in this encounter.   Impression + Management Plan   ICD-10-CM   1. Hormone replacement therapy, surgical memopause,  Z79.890    continue COC as HRT in this young woman      Follow Up: Return in about 3 years (around 05/10/2025), or if symptoms worsen or fail to improve, for Follow up.     All questions were answered.  Past Medical History:  Diagnosis Date   Anxiety    Back injury    Chronic pelvic pain in female    Endometriosis    Endometriosis determined by laparoscopy 10/06/2014   Ovarian cyst    Pelvic pain in female 10/06/2014   PONV (postoperative nausea and vomiting)    pt states that with 1st arthroscopy she "passed out and couldnt left her head" but they tell me it was a combination of the pain meds and anesthesia" No problems with next surgery. Not sure of meds given in anesthesia.   POTS (postural orthostatic tachycardia syndrome)    pt reports this was diagnosed a couple of months ago by PCP (Dr Higinio Plan)   PTSD (post-traumatic stress disorder)     Past Surgical History:  Procedure Laterality Date   ABDOMINAL HYSTERECTOMY     endometrosis  2014   [facility]geration-[facility]   KNEE SURGERY Bilateral    x2   LAPAROSCOPIC BILATERAL SALPINGO  OOPHERECTOMY Left 07/31/2018   Procedure: LAPAROSCOPIC LEFT SALPINGO OOPHORECTOMY;  Surgeon: Florian Buff, MD;  Location: [facility];  Service: Gynecology;  Laterality: Left;   LAPAROSCOPY N/A 11/26/2014   Procedure: DIAGNOSTIC LAPAROSCOPY;  Surgeon: Jonnie Kind, MD;  Location: [facility];  Service: Gynecology;  Laterality: N/A;   MICRODISCECTOMY LUMBAR  07/01/2020    OB History     Gravida  2   Para  2   Term  2   Preterm      AB      Living  2      SAB      IAB      Ectopic      Multiple      Live Births              Allergies  Allergen Reactions   Almond Meal Anaphylaxis   Cymbalta [Duloxetine Hcl]    Duloxetine    Other     Almonds   Sulfa Antibiotics Hives   Trazodone And Nefazodone     Sever body pain    Sudafed [Pseudoephedrine Hcl] Palpitations    Social History   Socioeconomic History   Marital status: Divorced    Spouse name: Not on file   Number of children: Not on file   Years of education: Not  on file   Highest education level: Not on file  Occupational History   Not on file  Tobacco Use   Smoking status: Former    Packs/day: .5    Types: Cigarettes, E-cigarettes    Quit date: 02/28/2019    Years since quitting: 3.2   Smokeless tobacco: Never  Vaping Use   Vaping Use: Former  Substance and Sexual Activity   Alcohol use: Yes    Comment: occ   Drug use: No   Sexual activity: Not Currently    Birth control/protection: Surgical    Comment: hyst  Other Topics Concern   Not on file  Social History Narrative   Not on file   Social Determinants of Health   Financial Resource Strain: Not on file  Food Insecurity: Not on file  Transportation Needs: Not on file  Physical Activity: Not on file  Stress: Not on file  Social Connections: Not on file    Family History  Problem Relation Age of Onset   Dementia Maternal Grandmother    Parkinson's disease Maternal Grandfather    Cancer Father        prostate   Other Brother         Lyme Disease   Other Sister        IBS   Thrombocytopenia Sister

## 2022-05-21 ENCOUNTER — Other Ambulatory Visit (HOSPITAL_COMMUNITY): Payer: Self-pay

## 2022-06-04 ENCOUNTER — Other Ambulatory Visit: Payer: Self-pay

## 2022-06-06 DIAGNOSIS — M6281 Muscle weakness (generalized): Secondary | ICD-10-CM | POA: Diagnosis not present

## 2022-06-06 DIAGNOSIS — M5416 Radiculopathy, lumbar region: Secondary | ICD-10-CM | POA: Diagnosis not present

## 2022-06-06 DIAGNOSIS — M545 Low back pain, unspecified: Secondary | ICD-10-CM | POA: Diagnosis not present

## 2022-06-08 ENCOUNTER — Other Ambulatory Visit (HOSPITAL_COMMUNITY): Payer: Self-pay

## 2022-06-09 ENCOUNTER — Other Ambulatory Visit (HOSPITAL_COMMUNITY): Payer: Self-pay

## 2022-06-09 MED ORDER — AMPHETAMINE-DEXTROAMPHET ER 25 MG PO CP24
ORAL_CAPSULE | Freq: Every day | ORAL | 0 refills | Status: DC
  Filled 2022-06-09 (×2): qty 30, 30d supply, fill #0

## 2022-06-11 DIAGNOSIS — M6281 Muscle weakness (generalized): Secondary | ICD-10-CM | POA: Diagnosis not present

## 2022-06-11 DIAGNOSIS — M545 Low back pain, unspecified: Secondary | ICD-10-CM | POA: Diagnosis not present

## 2022-06-11 DIAGNOSIS — M5416 Radiculopathy, lumbar region: Secondary | ICD-10-CM | POA: Diagnosis not present

## 2022-06-20 DIAGNOSIS — M545 Low back pain, unspecified: Secondary | ICD-10-CM | POA: Diagnosis not present

## 2022-06-20 DIAGNOSIS — M6281 Muscle weakness (generalized): Secondary | ICD-10-CM | POA: Diagnosis not present

## 2022-06-20 DIAGNOSIS — M5416 Radiculopathy, lumbar region: Secondary | ICD-10-CM | POA: Diagnosis not present

## 2022-06-25 DIAGNOSIS — M545 Low back pain, unspecified: Secondary | ICD-10-CM | POA: Diagnosis not present

## 2022-06-25 DIAGNOSIS — M6281 Muscle weakness (generalized): Secondary | ICD-10-CM | POA: Diagnosis not present

## 2022-06-25 DIAGNOSIS — M5416 Radiculopathy, lumbar region: Secondary | ICD-10-CM | POA: Diagnosis not present

## 2022-06-29 DIAGNOSIS — M5416 Radiculopathy, lumbar region: Secondary | ICD-10-CM | POA: Diagnosis not present

## 2022-06-29 DIAGNOSIS — M6281 Muscle weakness (generalized): Secondary | ICD-10-CM | POA: Diagnosis not present

## 2022-06-29 DIAGNOSIS — M545 Low back pain, unspecified: Secondary | ICD-10-CM | POA: Diagnosis not present

## 2022-07-05 ENCOUNTER — Ambulatory Visit (HOSPITAL_COMMUNITY)
Admission: RE | Admit: 2022-07-05 | Discharge: 2022-07-05 | Disposition: A | Discharge status: Home / Self Care | Payer: Commercial Managed Care - PPO | Source: Ambulatory Visit | Attending: Student | Admitting: Student

## 2022-07-05 DIAGNOSIS — M5416 Radiculopathy, lumbar region: Secondary | ICD-10-CM | POA: Insufficient documentation

## 2022-07-05 DIAGNOSIS — M6281 Muscle weakness (generalized): Secondary | ICD-10-CM | POA: Diagnosis not present

## 2022-07-05 DIAGNOSIS — M545 Low back pain, unspecified: Secondary | ICD-10-CM | POA: Diagnosis not present

## 2022-07-05 MED ORDER — GADOBUTROL 1 MMOL/ML IV SOLN
7.0000 mL | Freq: Once | INTRAVENOUS | Status: AC | PRN
  Administered 2022-07-05: 7 mL via INTRAVENOUS

## 2022-07-09 ENCOUNTER — Ambulatory Visit
Admission: RE | Admit: 2022-07-09 | Discharge: 2022-07-09 | Disposition: A | Discharge status: Home / Self Care | Payer: Commercial Managed Care - PPO | Source: Ambulatory Visit | Attending: Nurse Practitioner | Admitting: Nurse Practitioner

## 2022-07-09 VITALS — BP 129/88 | HR 81 | Temp 99.1°F | Resp 19

## 2022-07-09 DIAGNOSIS — M545 Low back pain, unspecified: Secondary | ICD-10-CM

## 2022-07-09 MED ORDER — METHOCARBAMOL 500 MG PO TABS: 500.0000 mg | ORAL_TABLET | Freq: Three times a day (TID) | ORAL | 0 refills | Status: AC | PRN

## 2022-07-09 MED ORDER — PREDNISONE 10 MG (21) PO TBPK: ORAL_TABLET | ORAL | 0 refills | Status: DC

## 2022-07-09 MED ORDER — METHYLPREDNISOLONE SODIUM SUCC 125 MG IJ SOLR
60.0000 mg | Freq: Once | INTRAMUSCULAR | Status: AC
  Administered 2022-07-09: 60 mg via INTRAMUSCULAR

## 2022-07-09 NOTE — ED Provider Notes (Signed)
[facility]    CSN: [id number] Arrival date & time: 07/09/22  1756      History   Chief Complaint Chief Complaint  Patient presents with   Back Pain    Lower back pain and sciatica - Entered by patient    HPI Rachel Griffith is a 37 y.o. female.   Patient presents for back pain that started gradually for the past few days.  She reports that she injured her back 2 years ago and has been following closely with neurosurgery and physical therapy.  Reports that physical therapy, they have slowly been ramping up her exercises and she has been doing them as they recommended.  Reports that she thinks she may have "tweaked" something by increasing exercises.  Reports sudden onset of worsening back pain after picking up her small dog a few days ago.  She reports the pain is severe, denies radiation down either leg.  Has been using ice/heat, muscle relaxant medication, and ibuprofen for pain with minimal improvement.  She denies numbness or tingling going down the legs, saddle anesthesia, new bowel or bladder incontinence, fever, nausea/vomiting, dysuria/urinary frequency, hematuria, lower extremity weakness, and decreased sensation in the lower extremities.   Patient reports she had an MRI last week to evaluate ongoing back pain.  Has had microdiscectomy in her lumbar spine as well as multiple bulging disks.    Past Medical History:  Diagnosis Date   Anxiety    Back injury    Chronic pelvic pain in female    Endometriosis    Endometriosis determined by laparoscopy 10/06/2014   Ovarian cyst    Pelvic pain in female 10/06/2014   PONV (postoperative nausea and vomiting)    pt states that with 1st arthroscopy she "passed out and couldnt left her head" but they tell me it was a combination of the pain meds and anesthesia" No problems with next surgery. Not sure of meds given in anesthesia.   POTS (postural orthostatic tachycardia syndrome)    pt reports this was diagnosed a couple  of months ago by PCP (Dr Annia Friendly)   PTSD (post-traumatic stress disorder)     Patient Active Problem List   Diagnosis Date Noted   Headache 07/10/2020   Left ovarian cyst 07/31/2018   Ovarian cyst, bilateral 02/08/2016   Postoperative abdominal pain 11/24/2014   Status post laparoscopic assisted vaginal hysterectomy (LAVH) 11/23/2014   Pelvic pain in female 10/06/2014   Endometriosis determined by laparoscopy 10/06/2014    Past Surgical History:  Procedure Laterality Date   ABDOMINAL HYSTERECTOMY     endometrosis  2014   [facility]geration-[facility]   KNEE SURGERY Bilateral    x2   LAPAROSCOPIC BILATERAL SALPINGO OOPHERECTOMY Left 07/31/2018   Procedure: LAPAROSCOPIC LEFT SALPINGO OOPHORECTOMY;  Surgeon: Lazaro Arms, MD;  Location: [facility];  Service: Gynecology;  Laterality: Left;   LAPAROSCOPY N/A 11/26/2014   Procedure: DIAGNOSTIC LAPAROSCOPY;  Surgeon: [name], MD;  Location: [facility];  Service: Gynecology;  Laterality: N/A;   MICRODISCECTOMY LUMBAR  07/01/2020    OB History     Gravida  2   Para  2   Term  2   Preterm      AB      Living  2      SAB      IAB      Ectopic      Multiple      Live Births  Home Medications    Prior to Admission medications   Medication Sig Start Date End Date Taking? Authorizing Provider  ALPRAZolam Prudy Feeler) 0.5 MG tablet Take 1 tablet by mouth every day as needed for anxiety. 10/05/20  Yes Nita Sells, MD  ALPRAZolam Prudy Feeler) 0.5 MG tablet Take 1 tablet (0.5 mg total) by mouth daily as needed for anxiety 04/04/22  Yes   amphetamine-dextroamphetamine (ADDERALL XR) 25 MG 24 hr capsule Take 1 capsule by mouth daily. 01/11/22  Yes   amphetamine-dextroamphetamine (ADDERALL XR) 25 MG 24 hr capsule Take 1 capsule by mouth daily. 06/09/22  Yes   desogestrel-ethinyl estradiol (APRI) 0.15-30 MG-MCG tablet 1 tablet Orally Once a day   Yes [provider]  desogestrel-ethinyl estradiol (CYRED EQ) 0.15-30 MG-MCG  tablet Take 1 tablet by mouth daily. Take one by mouth daily, skip placebo pills. 05/11/22  Yes Lazaro Arms, MD  fludrocortisone (FLORINEF) 0.1 MG tablet Take 1 tablet (0.1 mg total) by mouth 2 (two) times daily. 02/09/22  Yes Marinus Maw, MD  gabapentin (NEURONTIN) 600 MG tablet Take 1 tablet (600 mg total) by mouth 2 (two) times daily AND 3 tablets (1,800 mg total) at bedtime. 01/23/22  Yes   ibuprofen (ADVIL) 200 MG tablet Take 800 mg by mouth every 6 (six) hours as needed for mild pain or moderate pain.   Yes [provider]  levocetirizine (XYZAL) 5 MG tablet Take 5 mg by mouth daily.   Yes [provider]  methocarbamol (ROBAXIN) 500 MG tablet Take 1 tablet (500 mg total) by mouth every 8 (eight) hours as needed for muscle spasms. Do not take with alcohol or while driving or operating heavy machinery.  May cause drowsiness. 07/09/22  Yes Cathlean Marseilles A, NP  Potassium Chloride ER 20 MEQ TBCR Take 1 tablet by mouth daily. 08/02/21  Yes   predniSONE (STERAPRED UNI-PAK 21 TAB) 10 MG (21) TBPK tablet Take per package instructions 07/09/22  Yes Valentino Nose, NP  vortioxetine HBr (TRINTELLIX) 10 MG TABS tablet Take 1 tablet by mouth once daily 10/04/20  Yes   vortioxetine HBr (TRINTELLIX) 10 MG TABS tablet Take 1 tablet (10 mg total) by mouth daily. 01/31/22  Yes   albuterol (VENTOLIN HFA) 108 (90 Base) MCG/ACT inhaler Inhale 2 puffs into the lungs every 4 (four) hours as needed for wheezing or shortness of breath. 04/19/19   [provider]  albuterol (VENTOLIN HFA) 108 (90 Base) MCG/ACT inhaler INHALE 2 PUFFS BY MOUTH EVERY 4 HOURS AS NEEDED Patient not taking: Reported on 05/11/2022 11/21/20       Family History Family History  Problem Relation Age of Onset   Dementia Maternal Grandmother    Parkinson's disease Maternal Grandfather    Cancer Father        prostate   Other Brother        Lyme Disease   Other Sister        IBS   Thrombocytopenia Sister      Social History Social History   Tobacco Use   Smoking status: Former    Packs/day: .5    Types: Cigarettes, E-cigarettes    Quit date: 02/28/2019    Years since quitting: 3.3   Smokeless tobacco: Never  Vaping Use   Vaping Use: Former  Substance Use Topics   Alcohol use: Yes    Comment: occ   Drug use: No     Allergies   Almond meal, Cymbalta [duloxetine hcl], Duloxetine, Other, Sulfa antibiotics, Trazodone  and nefazodone, and Sudafed [pseudoephedrine hcl]   Review of Systems Review of Systems Per HPI  Physical Exam Triage Vital Signs ED Triage Vitals  Enc Vitals Group     BP 07/09/22 1814 129/88     Pulse Rate 07/09/22 1814 81     Resp 07/09/22 1814 19     Temp 07/09/22 1814 99.1 F (37.3 C)     Temp Source 07/09/22 1814 Oral     SpO2 07/09/22 1814 97 %     Weight --      Height --      Head Circumference --      Peak Flow --      Pain Score 07/09/22 1815 9     Pain Loc --      Pain Edu? --      Excl. in GC? --    No data found.  Updated Vital Signs BP 129/88 (BP Location: Right Arm)   Pulse 81   Temp 99.1 F (37.3 C) (Oral)   Resp 19   LMP 09/19/2014 Comment: post LAVH  SpO2 97%   Visual Acuity Right Eye Distance:   Left Eye Distance:   Bilateral Distance:    Right Eye Near:   Left Eye Near:    Bilateral Near:     Physical Exam Vitals and nursing note reviewed.  Constitutional:      General: She is not in acute distress.    Appearance: Normal appearance. She is not toxic-appearing.     Comments: Uncomfortable appearing  HENT:     Mouth/Throat:     Mouth: Mucous membranes are moist.     Pharynx: Oropharynx is clear.  Pulmonary:     Effort: Pulmonary effort is normal. No respiratory distress.  Musculoskeletal:       Back:     Comments: Inspection: No swelling, obvious deformity, or redness to left back Palpation: Left back tender to palpation in area marked  Skin:    General: Skin is warm and dry.     Capillary Refill:  Capillary refill takes less than 2 seconds.     Coloration: Skin is not jaundiced or pale.     Findings: No erythema.  Neurological:     Mental Status: She is alert and oriented to person, place, and time.  Psychiatric:        Behavior: Behavior is cooperative.      UC Treatments / Results  Labs (all labs ordered are listed, but only abnormal results are displayed) Labs Reviewed - No data to display  EKG   Radiology No results found.  Procedures Procedures (including critical care time)  Medications Ordered in UC Medications  methylPREDNISolone sodium succinate (SOLU-MEDROL) 125 mg/2 mL injection 60 mg (60 mg Intramuscular Given 07/09/22 1839)    Initial Impression / Assessment and Plan / UC Course  I have reviewed the triage vital signs and the nursing notes.  Pertinent labs & imaging results that were available during my care of the patient were reviewed by me and considered in my medical decision making (see chart for details).   Patient is well-appearing, normotensive, afebrile, not tachycardic, not tachypneic, oxygenating well on room air.    1. Acute left-sided low back pain without sciatica Treat with a Medrol 60 mg IM today in urgent care, start prednisone taper tomorrow Also start methocarbamol in addition to ibuprofen as needed for pain Recommended close follow-up with neurosurgery with improvement or worsening of symptoms despite treatment Strict ER precautions discussed  The  patient was given the opportunity to ask questions.  All questions answered to their satisfaction.  The patient is in agreement to this plan.    Final Clinical Impressions(s) / UC Diagnoses   Final diagnoses:  Acute left-sided low back pain without sciatica     Discharge Instructions      We have given you a steroid shot to help with pain and inflammation today.  Please start the oral prednisone taper pack tomorrow.  Continue ice/heat and methocarbamol along with ibuprofen as  needed for back pain.  Seek care if you develop new urinary or bladder incontinence, numbness or tingling going down your legs, or weakness in your legs.    ED Prescriptions     Medication Sig Dispense Auth. Provider   predniSONE (STERAPRED UNI-PAK 21 TAB) 10 MG (21) TBPK tablet Take per package instructions 21 each Cathlean Marseilles A, NP   methocarbamol (ROBAXIN) 500 MG tablet Take 1 tablet (500 mg total) by mouth every 8 (eight) hours as needed for muscle spasms. Do not take with alcohol or while driving or operating heavy machinery.  May cause drowsiness. 30 tablet Valentino Nose, NP      PDMP not reviewed this encounter.   Valentino Nose, NP 07/09/22 1941

## 2022-07-09 NOTE — ED Triage Notes (Signed)
Back pain that started 2 years ago, patient did PT and states it helped a lot, but last last pt states she bent over and had trouble standing up and now having constant back pain, lower back pain on left side. Taking ibuprofen.

## 2022-07-09 NOTE — Discharge Instructions (Signed)
We have given you a steroid shot to help with pain and inflammation today.  Please start the oral prednisone taper pack tomorrow.  Continue ice/heat and methocarbamol along with ibuprofen as needed for back pain.  Seek care if you develop new urinary or bladder incontinence, numbness or tingling going down your legs, or weakness in your legs.

## 2022-07-15 ENCOUNTER — Other Ambulatory Visit (HOSPITAL_COMMUNITY): Payer: Self-pay

## 2022-07-16 ENCOUNTER — Other Ambulatory Visit: Payer: Self-pay

## 2022-07-16 ENCOUNTER — Other Ambulatory Visit (HOSPITAL_COMMUNITY): Payer: Self-pay

## 2022-07-16 MED ORDER — ALPRAZOLAM 0.5 MG PO TABS
0.5000 mg | ORAL_TABLET | Freq: Every day | ORAL | 2 refills | Status: DC | PRN
  Filled 2022-07-16: qty 30, 30d supply, fill #0
  Filled 2022-08-13: qty 30, 30d supply, fill #1
  Filled 2022-09-18: qty 30, 30d supply, fill #2

## 2022-07-16 MED ORDER — GABAPENTIN 600 MG PO TABS
600.0000 mg | ORAL_TABLET | Freq: Two times a day (BID) | ORAL | 3 refills | Status: DC
  Filled 2022-07-16: qty 150, 30d supply, fill #0
  Filled 2022-09-03: qty 150, 30d supply, fill #1
  Filled 2022-10-19: qty 150, 30d supply, fill #2
  Filled 2022-11-23 (×2): qty 150, 30d supply, fill #3

## 2022-07-16 MED ORDER — AMPHETAMINE-DEXTROAMPHET ER 25 MG PO CP24
25.0000 mg | ORAL_CAPSULE | Freq: Every day | ORAL | 0 refills | Status: DC
  Filled 2022-07-16: qty 30, 30d supply, fill #0

## 2022-07-17 DIAGNOSIS — M6281 Muscle weakness (generalized): Secondary | ICD-10-CM | POA: Diagnosis not present

## 2022-07-17 DIAGNOSIS — M5416 Radiculopathy, lumbar region: Secondary | ICD-10-CM | POA: Diagnosis not present

## 2022-07-17 DIAGNOSIS — M545 Low back pain, unspecified: Secondary | ICD-10-CM | POA: Diagnosis not present

## 2022-07-20 DIAGNOSIS — M5416 Radiculopathy, lumbar region: Secondary | ICD-10-CM | POA: Diagnosis not present

## 2022-07-20 DIAGNOSIS — M545 Low back pain, unspecified: Secondary | ICD-10-CM | POA: Diagnosis not present

## 2022-07-20 DIAGNOSIS — M6281 Muscle weakness (generalized): Secondary | ICD-10-CM | POA: Diagnosis not present

## 2022-07-24 ENCOUNTER — Other Ambulatory Visit (HOSPITAL_COMMUNITY): Payer: Self-pay

## 2022-07-25 ENCOUNTER — Other Ambulatory Visit (HOSPITAL_COMMUNITY): Payer: Self-pay

## 2022-07-25 DIAGNOSIS — M545 Low back pain, unspecified: Secondary | ICD-10-CM | POA: Diagnosis not present

## 2022-07-25 DIAGNOSIS — M5416 Radiculopathy, lumbar region: Secondary | ICD-10-CM | POA: Diagnosis not present

## 2022-07-25 DIAGNOSIS — M6281 Muscle weakness (generalized): Secondary | ICD-10-CM | POA: Diagnosis not present

## 2022-07-25 MED ORDER — AMPHETAMINE-DEXTROAMPHET ER 25 MG PO CP24
ORAL_CAPSULE | Freq: Every day | ORAL | 0 refills | Status: DC
  Filled 2022-07-25 – 2022-08-24 (×2): qty 30, 30d supply, fill #0

## 2022-07-26 DIAGNOSIS — M5136 Other intervertebral disc degeneration, lumbar region: Secondary | ICD-10-CM | POA: Diagnosis not present

## 2022-07-30 ENCOUNTER — Other Ambulatory Visit (HOSPITAL_COMMUNITY): Payer: Self-pay

## 2022-07-30 ENCOUNTER — Other Ambulatory Visit: Payer: Self-pay

## 2022-08-13 ENCOUNTER — Other Ambulatory Visit (HOSPITAL_COMMUNITY): Payer: Self-pay

## 2022-08-14 ENCOUNTER — Other Ambulatory Visit (HOSPITAL_COMMUNITY): Payer: Self-pay

## 2022-08-14 MED ORDER — POTASSIUM CHLORIDE ER 20 MEQ PO TBCR
1.0000 | EXTENDED_RELEASE_TABLET | Freq: Every day | ORAL | 2 refills | Status: DC
  Filled 2022-08-14: qty 30, 30d supply, fill #0
  Filled 2022-11-23 (×2): qty 30, 30d supply, fill #1
  Filled 2023-02-28: qty 30, 30d supply, fill #2

## 2022-08-15 ENCOUNTER — Other Ambulatory Visit (HOSPITAL_COMMUNITY): Payer: Self-pay

## 2022-08-16 ENCOUNTER — Other Ambulatory Visit: Payer: Self-pay

## 2022-08-17 ENCOUNTER — Ambulatory Visit
Admission: RE | Admit: 2022-08-17 | Discharge: 2022-08-17 | Disposition: A | Discharge status: Home / Self Care | Payer: Commercial Managed Care - PPO | Source: Ambulatory Visit | Attending: Nurse Practitioner | Admitting: Nurse Practitioner

## 2022-08-17 ENCOUNTER — Ambulatory Visit (INDEPENDENT_AMBULATORY_CARE_PROVIDER_SITE_OTHER): Payer: Commercial Managed Care - PPO

## 2022-08-17 VITALS — BP 124/82 | HR 67 | Temp 99.1°F | Resp 15

## 2022-08-17 DIAGNOSIS — M25572 Pain in left ankle and joints of left foot: Secondary | ICD-10-CM

## 2022-08-17 NOTE — Discharge Instructions (Signed)
The ankle xray today does not show any fracture in your ankle bones.  Your symptoms and area of pain are consistent with a stress fracture.  Please wear the lace up ankle brace any time you are bearing weight on your ankle.  Recommend rest from running for the next few weeks.  Continue ice/ibuprofen as needed for pain.  Follow up with Podiatry for worsening symptoms despite treatment.

## 2022-08-17 NOTE — ED Provider Notes (Signed)
[facility]    CSN: [id number] Arrival date & time: 08/17/22  1248      History   Chief Complaint Chief Complaint  Patient presents with   Ankle Pain    Possible stress fracture. Ankle bone pain after running 6 miles a week. - Entered by patient    HPI Rachel Griffith is a 38 y.o. female.   Patient presents today for 4 day history of left ankle pain.  Reports she has recently started running for exercise and used to be a long distance runner.  Pain began after beginning a run.  No recent fall, accident, trauma, or injury involving the ankle.  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  Patient denies swelling, bruising, or redness to the ankle.  No numbness or tingling in the toes.  She has applied ice and has been taking ibuprofen which both seem to help temporarily.  She is concerned for a stress fracture.      Past Medical History:  Diagnosis Date   Anxiety    Back injury    Chronic pelvic pain in female    Endometriosis    Endometriosis determined by laparoscopy 10/06/2014   Ovarian cyst    Pelvic pain in female 10/06/2014   PONV (postoperative nausea and vomiting)    pt states that with 1st arthroscopy she "passed out and couldnt left her head" but they tell me it was a combination of the pain meds and anesthesia" No problems with next surgery. Not sure of meds given in anesthesia.   POTS (postural orthostatic tachycardia syndrome)    pt reports this was diagnosed a couple of months ago by PCP (Dr Annia Friendly)   PTSD (post-traumatic stress disorder)     Patient Active Problem List   Diagnosis Date Noted   Headache 07/10/2020   Left ovarian cyst 07/31/2018   Ovarian cyst, bilateral 02/08/2016   Postoperative abdominal pain 11/24/2014   Status post laparoscopic assisted vaginal hysterectomy (LAVH) 11/23/2014   Pelvic pain in female 10/06/2014   Endometriosis determined by laparoscopy 10/06/2014    Past Surgical  History:  Procedure Laterality Date   ABDOMINAL HYSTERECTOMY     endometrosis  2014   [facility]geration-[facility]   KNEE SURGERY Bilateral    x2   LAPAROSCOPIC BILATERAL SALPINGO OOPHERECTOMY Left 07/31/2018   Procedure: LAPAROSCOPIC LEFT SALPINGO OOPHORECTOMY;  Surgeon: Lazaro Arms, MD;  Location: [facility];  Service: Gynecology;  Laterality: Left;   LAPAROSCOPY N/A 11/26/2014   Procedure: DIAGNOSTIC LAPAROSCOPY;  Surgeon: [name], MD;  Location: [facility];  Service: Gynecology;  Laterality: N/A;   MICRODISCECTOMY LUMBAR  07/01/2020    OB History     Gravida  2   Para  2   Term  2   Preterm      AB      Living  2      SAB      IAB      Ectopic      Multiple      Live Births               Home Medications    Prior to Admission medications   Medication Sig Start Date End Date Taking? Authorizing Provider  albuterol (VENTOLIN HFA) 108 (90 Base) MCG/ACT inhaler Inhale 2 puffs into the lungs every 4 (four) hours as needed for wheezing or shortness of breath. 04/19/19   [provider]  albuterol (VENTOLIN HFA) 108 (90 Base) MCG/ACT inhaler INHALE 2 PUFFS BY MOUTH EVERY 4 HOURS AS NEEDED Patient not taking: Reported on 05/11/2022 11/21/20     ALPRAZolam (XANAX) 0.5 MG tablet Take 1 tablet by mouth every day as needed for anxiety. 10/05/20   Nita Sells, MD  ALPRAZolam Prudy Feeler) 0.5 MG tablet Take 1 tablet (0.5 mg) by mouth daily as needed for anxiety 07/16/22     amphetamine-dextroamphetamine (ADDERALL XR) 25 MG 24 hr capsule Take 1 capsule by mouth daily. 01/11/22     amphetamine-dextroamphetamine (ADDERALL XR) 25 MG 24 hr capsule Take 1 capsule by mouth daily. 07/25/22     desogestrel-ethinyl estradiol (APRI) 0.15-30 MG-MCG tablet 1 tablet Orally Once a day    [provider]  desogestrel-ethinyl estradiol (CYRED EQ) 0.15-30 MG-MCG tablet Take 1 tablet by mouth daily. Take one by mouth daily, skip placebo pills. 05/11/22   Lazaro Arms, MD  fludrocortisone  (FLORINEF) 0.1 MG tablet Take 1 tablet (0.1 mg total) by mouth 2 (two) times daily. 02/09/22   Marinus Maw, MD  gabapentin (NEURONTIN) 600 MG tablet Take 1 tablet by mouth 2 times daily and 3 tablets at bedtime 07/16/22     ibuprofen (ADVIL) 200 MG tablet Take 800 mg by mouth every 6 (six) hours as needed for mild pain or moderate pain.    [provider]  levocetirizine (XYZAL) 5 MG tablet Take 5 mg by mouth daily.    [provider]  methocarbamol (ROBAXIN) 500 MG tablet Take 1 tablet (500 mg total) by mouth every 8 (eight) hours as needed for muscle spasms. Do not take with alcohol or while driving or operating heavy machinery.  May cause drowsiness. 07/09/22   Valentino Nose, NP  Potassium Chloride ER 20 MEQ TBCR Take 1 tablet (20 mEq total) by mouth daily. 08/14/22     predniSONE (STERAPRED UNI-PAK 21 TAB) 10 MG (21) TBPK tablet Take per package instructions 07/09/22   Valentino Nose, NP  vortioxetine HBr (TRINTELLIX) 10 MG TABS tablet Take 1 tablet by mouth once daily 10/04/20     vortioxetine HBr (TRINTELLIX) 10 MG TABS tablet Take 1 tablet (10 mg total) by mouth daily. 01/31/22       Family History Family History  Problem Relation Age of Onset   Dementia Maternal Grandmother    Parkinson's disease Maternal Grandfather    Cancer Father        prostate   Other Brother        Lyme Disease   Other Sister        IBS   Thrombocytopenia Sister     Social History Social History   Tobacco Use   Smoking status: Former    Packs/day: .5    Types: Cigarettes, E-cigarettes    Quit date: 02/28/2019    Years since quitting: 3.4   Smokeless tobacco: Never  Vaping Use   Vaping Use: Former  Substance Use Topics   Alcohol use: Yes    Comment: occ   Drug use: No     Allergies   Almond mea, Cymbalta [duloxetine hcl], Duloxetine, Other, Sulfa antibiotics, Trazodone and nefazodone, and Sudafed [pseudoephedrine hcl]   Review of Systems Review of Systems Per  HPI  Physical Exam Triage Vital Signs ED Triage Vitals  Enc Vitals Group     BP 08/17/22 1315 124/82     Pulse Rate 08/17/22 1315 67     Resp 08/17/22 1315 15     Temp  08/17/22 1315 99.1 F (37.3 C)     Temp Source 08/17/22 1315 Oral     SpO2 08/17/22 1315 99 %     Weight --      Height --      Head Circumference --      Peak Flow --      Pain Score 08/17/22 1317 7     Pain Loc --      Pain Edu? --      Excl. in GC? --    No data found.  Updated Vital Signs BP 124/82 (BP Location: Right Arm)   Pulse 67   Temp 99.1 F (37.3 C) (Oral)   Resp 15   LMP 09/19/2014 Comment: post LAVH  SpO2 99%   Visual Acuity Right Eye Distance:   Left Eye Distance:   Bilateral Distance:    Right Eye Near:   Left Eye Near:    Bilateral Near:     Physical Exam Vitals and nursing note reviewed.  Constitutional:      General: She is not in acute distress.    Appearance: Normal appearance. She is not toxic-appearing.  HENT:     Mouth/Throat:     Mouth: Mucous membranes are moist.     Pharynx: Oropharynx is clear.  Pulmonary:     Effort: Pulmonary effort is normal. No respiratory distress.  Musculoskeletal:     Comments: Inspection: no swelling, obvious deformity, redness, or bruising to left ankle Palpation: right medial malleolus tender to palpation diffusely; no obvious deformities palpated ROM: Full ROM to right ankle and flexibility of foot  Strength: 5/5 bilateral lower extremities Neurovascular: neurovascularly intact in left and right lower extremity   Skin:    General: Skin is warm and dry.     Capillary Refill: Capillary refill takes less than 2 seconds.     Coloration: Skin is not jaundiced or pale.     Findings: No erythema.  Neurological:     Mental Status: She is alert and oriented to person, place, and time.  Psychiatric:        Behavior: Behavior is cooperative.      UC Treatments / Results  Labs (all labs ordered are listed, but only abnormal results are  displayed) Labs Reviewed - No data to display  EKG   Radiology DG Ankle Complete Left  Result Date: 08/17/2022 CLINICAL DATA:  Left ankle pain after running 2 weeks ago. EXAM: LEFT ANKLE COMPLETE - 3+ VIEW COMPARISON:  None Available. FINDINGS: There is no evidence of fracture, dislocation, or joint effusion. There is no evidence of arthropathy or other focal bone abnormality. Soft tissues are unremarkable. IMPRESSION: Negative. Electronically Signed   By: Lupita Raider M.D.   On: 08/17/2022 13:39    Procedures Procedures (including critical care time)  Medications Ordered in UC Medications - No data to display  Initial Impression / Assessment and Plan / UC Course  I have reviewed the triage vital signs and the nursing notes.  Pertinent labs & imaging results that were available during my care of the patient were reviewed by me and considered in my medical decision making (see chart for details).   Patient is well-appearing, normotensive, afebrile, not tachycardic, not tachypneic, oxygenating well on room air.    1. Acute left ankle pain Xray imaging is negative for bony abnormality Examination and history is concerned for stress fracture Start ASO ankle brace, rest from exercise, ice, and elevation Continue ibuprofen as needed for pain Follow up  with Podiatry if symptoms worsen despite treatment   The patient was given the opportunity to ask questions.  All questions answered to their satisfaction.  The patient is in agreement to this plan.    Final Clinical Impressions(s) / UC Diagnoses   Final diagnoses:  Acute left ankle pain     Discharge Instructions      The ankle xray today does not show any fracture in your ankle bones.  Your symptoms and area of pain are consistent with a stress fracture.  Please wear the lace up ankle brace any time you are bearing weight on your ankle.  Recommend rest from running for the next few weeks.  Continue ice/ibuprofen as needed for  pain.  Follow up with Podiatry for worsening symptoms despite treatment.    ED Prescriptions   None    PDMP not reviewed this encounter.   Valentino Nose, NP 08/17/22 1415

## 2022-08-17 NOTE — ED Triage Notes (Signed)
Pt c/o ankle pain left pain started on Monday, pt noticed pain after going for a run, by the time she got home she noticed it aching non stop.

## 2022-08-24 ENCOUNTER — Other Ambulatory Visit: Payer: Self-pay

## 2022-08-24 ENCOUNTER — Other Ambulatory Visit (HOSPITAL_COMMUNITY): Payer: Self-pay

## 2022-09-06 ENCOUNTER — Other Ambulatory Visit (HOSPITAL_COMMUNITY): Payer: Self-pay

## 2022-09-18 ENCOUNTER — Other Ambulatory Visit: Payer: Self-pay

## 2022-10-01 ENCOUNTER — Telehealth: Payer: Self-pay

## 2022-10-01 NOTE — Telephone Encounter (Signed)
Was noted pt did not sign agreement for ASO ankle brace on ipad. Called pt to ask if she would be ok to come back at a time that works for her to sign her name. Pt agreed and stated she would be by tomorrow to sign her name on ipad for ortho device.

## 2022-10-04 ENCOUNTER — Other Ambulatory Visit (HOSPITAL_COMMUNITY): Payer: Self-pay

## 2022-10-04 MED ORDER — AMPHETAMINE-DEXTROAMPHET ER 25 MG PO CP24
25.0000 mg | ORAL_CAPSULE | Freq: Every day | ORAL | 0 refills | Status: AC
  Filled 2022-10-04: qty 30, 30d supply, fill #0

## 2022-10-05 ENCOUNTER — Encounter (HOSPITAL_COMMUNITY): Payer: Self-pay | Admitting: Emergency Medicine

## 2022-10-05 ENCOUNTER — Other Ambulatory Visit (HOSPITAL_COMMUNITY): Payer: Self-pay

## 2022-10-05 ENCOUNTER — Emergency Department (HOSPITAL_COMMUNITY)
Admission: EM | Admit: 2022-10-05 | Discharge: 2022-10-05 | Disposition: A | Discharge status: Home / Self Care | Payer: Commercial Managed Care - PPO | Attending: Emergency Medicine | Admitting: Emergency Medicine

## 2022-10-05 ENCOUNTER — Other Ambulatory Visit: Payer: Self-pay

## 2022-10-05 DIAGNOSIS — G90A Postural orthostatic tachycardia syndrome (POTS): Secondary | ICD-10-CM | POA: Diagnosis not present

## 2022-10-05 DIAGNOSIS — R42 Dizziness and giddiness: Secondary | ICD-10-CM | POA: Diagnosis not present

## 2022-10-05 DIAGNOSIS — T887XXA Unspecified adverse effect of drug or medicament, initial encounter: Secondary | ICD-10-CM

## 2022-10-05 MED ORDER — AMPHETAMINE-DEXTROAMPHET ER 25 MG PO CP24
25.0000 mg | ORAL_CAPSULE | Freq: Every day | ORAL | 0 refills | Status: DC
  Filled 2022-10-05: qty 30, 30d supply, fill #0

## 2022-10-05 NOTE — ED Triage Notes (Signed)
PT to ER from work.  States she took dramamine this AM, thought it was the chewable but was not.  States now having dizziness and tachycardia.  Pt has hx of POTS.

## 2022-10-05 NOTE — ED Provider Notes (Addendum)
[place] [facility]MENT AT [facility] Provider Note   CSN: [id number] Arrival date & time: 10/05/22  0930     History  Chief Complaint  Patient presents with   Dizziness   Palpitations    Rachel Griffith is a 37 y.o. female.  HPI    37 year old female comes in with chief complaint of dizziness, palpitations.  Patient has history of POTS syndrome.  She states that she woke up this morning and was feeling nauseous.  She chewed down 50 mg of 9 chewable Dramamine.  She drove to her work at 7 AM feeling well, but soon started feeling dizzy, with some associated vertiginous component and patient also felt at 1 point that she might faint.  She indicates that the symptoms of dizziness are constant.  She also felt some of her POTS related tachycardia during this episode and was clammy.  Patient was advised to go to the emergency room by her colleagues.  Home Medications Prior to Admission medications   Medication Sig Start Date End Date Taking? Authorizing Provider  albuterol (VENTOLIN HFA) 108 (90 Base) MCG/ACT inhaler Inhale 2 puffs into the lungs every 4 (four) hours as needed for wheezing or shortness of breath. 04/19/19   [provider]  albuterol (VENTOLIN HFA) 108 (90 Base) MCG/ACT inhaler INHALE 2 PUFFS BY MOUTH EVERY 4 HOURS AS NEEDED Patient not taking: Reported on 05/11/2022 11/21/20     ALPRAZolam (XANAX) 0.5 MG tablet Take 1 tablet by mouth every day as needed for anxiety. 10/05/20   Nita Sells, MD  ALPRAZolam Prudy Feeler) 0.5 MG tablet Take 1 tablet (0.5 mg) by mouth daily as needed for anxiety 07/16/22     amphetamine-dextroamphetamine (ADDERALL XR) 25 MG 24 hr capsule Take 1 capsule by mouth daily. 01/11/22     amphetamine-dextroamphetamine (ADDERALL XR) 25 MG 24 hr capsule Take 1 capsule by mouth daily. 10/04/22     desogestrel-ethinyl estradiol (APRI) 0.15-30 MG-MCG tablet 1 tablet Orally Once a day    [provider]  desogestrel-ethinyl  estradiol (CYRED EQ) 0.15-30 MG-MCG tablet Take 1 tablet by mouth daily. Take one by mouth daily, skip placebo pills. 05/11/22   Lazaro Arms, MD  fludrocortisone (FLORINEF) 0.1 MG tablet Take 1 tablet (0.1 mg total) by mouth 2 (two) times daily. 02/09/22   Marinus Maw, MD  gabapentin (NEURONTIN) 600 MG tablet Take 1 tablet by mouth 2 times daily and 3 tablets at bedtime 07/16/22     ibuprofen (ADVIL) 200 MG tablet Take 800 mg by mouth every 6 (six) hours as needed for mild pain or moderate pain.    [provider]  levocetirizine (XYZAL) 5 MG tablet Take 5 mg by mouth daily.    [provider]  methocarbamol (ROBAXIN) 500 MG tablet Take 1 tablet (500 mg total) by mouth every 8 (eight) hours as needed for muscle spasms. Do not take with alcohol or while driving or operating heavy machinery.  May cause drowsiness. 07/09/22   Valentino Nose, NP  Potassium Chloride ER 20 MEQ TBCR Take 1 tablet (20 mEq total) by mouth daily. 08/14/22     predniSONE (STERAPRED UNI-PAK 21 TAB) 10 MG (21) TBPK tablet Take per package instructions 07/09/22   Valentino Nose, NP  vortioxetine HBr (TRINTELLIX) 10 MG TABS tablet Take 1 tablet by mouth once daily 10/04/20     vortioxetine HBr (TRINTELLIX) 10 MG TABS tablet Take 1 tablet (10 mg total) by mouth daily. 01/31/22  Allergies    Almond mea, Cymbalta [duloxetine hcl], Duloxetine, Other, Sulfa antibiotics, Trazodone and nefazodone, and Sudafed [pseudoephedrine hcl]    Review of Systems   Review of Systems  All other systems reviewed and are negative.   Physical Exam Updated Vital Signs BP 131/81   Pulse 70   Temp 98.8 F (37.1 C)   Resp 18   Ht 5\' 9"  (1.753 m)   Wt 72.6 kg   LMP 09/19/2014 Comment: post LAVH  SpO2 100%   BMI 23.63 kg/m  Physical Exam Vitals and nursing note reviewed.  Constitutional:      Appearance: She is well-developed.  Eyes:     Comments: No nystagmus  Cardiovascular:     Rate and Rhythm:  Normal rate.     Pulses: Normal pulses.  Pulmonary:     Effort: Pulmonary effort is normal.  Musculoskeletal:     Cervical back: Neck supple.  Skin:    General: Skin is warm.  Neurological:     Mental Status: She is alert and oriented to person, place, and time.     ED Results / Procedures / Treatments   Labs (all labs ordered are listed, but only abnormal results are displayed) Labs Reviewed - No data to display  EKG EKG Interpretation Date/Time:  Friday October 05 2022 09:42:54 EDT Ventricular Rate:  67 PR Interval:  132 QRS Duration:  71 QT Interval:  374 QTC Calculation: 395 R Axis:   51  Text Interpretation: Sinus rhythm No acute changes No significant change since last tracing Confirmed by Derwood Kaplan [phone]) on 10/05/2022 9:58:56 AM  Radiology No results found.  Procedures Procedures    Medications Ordered in ED Medications - No data to display  ED Course/ Medical Decision Making/ A&P                                 Medical Decision Making 37 year old patient comes in with chief complaint of dizziness.  She has history of POTS, and indicates that she chewed down nodule bowl Dramamine around 5:30 AM this morning.   Vital signs are stable and within normal limits.  Patient has no nystagmus.  Differential diagnosis considered for this patient includes Dramamine side effects, POTS flareup.  Patient denies any concerns for dehydration. She works as a Engineer, civil (consulting) in the ICU, has been feeling well the last few days and does not see any reason to get labs at this time.  Patient will be monitored in the ED, if she continues to do well then she can be discharged.  Final Clinical Impression(s) / ED Diagnoses Final diagnoses:  None    Rx / DC Orders ED Discharge Orders     None         Derwood Kaplan, MD 10/05/22 1014    Derwood Kaplan, MD 10/05/22 1108

## 2022-10-05 NOTE — Discharge Instructions (Signed)
We recommend that you hydrate well and take it easy rest of the day -allowing for the ill effects of Dramamine to wear off. Be careful with any activity.

## 2022-10-08 ENCOUNTER — Other Ambulatory Visit (HOSPITAL_COMMUNITY): Payer: Self-pay

## 2022-10-08 MED ORDER — AMPHETAMINE-DEXTROAMPHET ER 30 MG PO CP24
30.0000 mg | ORAL_CAPSULE | Freq: Every day | ORAL | 0 refills | Status: DC
  Filled 2022-10-08: qty 30, 30d supply, fill #0

## 2022-10-09 ENCOUNTER — Other Ambulatory Visit (HOSPITAL_COMMUNITY): Payer: Self-pay

## 2022-10-15 DIAGNOSIS — M25572 Pain in left ankle and joints of left foot: Secondary | ICD-10-CM | POA: Diagnosis not present

## 2022-10-15 DIAGNOSIS — M84372A Stress fracture, left ankle, initial encounter for fracture: Secondary | ICD-10-CM | POA: Diagnosis not present

## 2022-10-19 ENCOUNTER — Other Ambulatory Visit (HOSPITAL_COMMUNITY): Payer: Self-pay

## 2022-10-20 ENCOUNTER — Other Ambulatory Visit (HOSPITAL_COMMUNITY): Payer: Self-pay

## 2022-10-22 ENCOUNTER — Other Ambulatory Visit: Payer: Self-pay

## 2022-10-22 ENCOUNTER — Other Ambulatory Visit (HOSPITAL_COMMUNITY): Payer: Self-pay

## 2022-10-22 MED ORDER — ALPRAZOLAM 0.5 MG PO TABS
0.5000 mg | ORAL_TABLET | Freq: Every day | ORAL | 2 refills | Status: DC | PRN
  Filled 2022-10-22: qty 30, 30d supply, fill #0
  Filled 2022-11-23 (×2): qty 30, 30d supply, fill #1
  Filled 2022-12-24: qty 30, 30d supply, fill #2

## 2022-10-23 ENCOUNTER — Other Ambulatory Visit (HOSPITAL_COMMUNITY): Payer: Self-pay

## 2022-11-09 ENCOUNTER — Other Ambulatory Visit (HOSPITAL_COMMUNITY): Payer: Self-pay

## 2022-11-09 MED ORDER — METHOCARBAMOL 500 MG PO TABS
500.0000 mg | ORAL_TABLET | Freq: Four times a day (QID) | ORAL | 1 refills | Status: DC | PRN
  Filled 2022-11-09: qty 28, 7d supply, fill #0
  Filled 2023-01-29: qty 28, 7d supply, fill #1

## 2022-11-09 MED ORDER — METHYLPREDNISOLONE 4 MG PO TBPK
ORAL_TABLET | ORAL | 0 refills | Status: DC
  Filled 2022-11-09: qty 21, 6d supply, fill #0

## 2022-11-12 ENCOUNTER — Other Ambulatory Visit (HOSPITAL_COMMUNITY): Payer: Self-pay

## 2022-11-12 MED ORDER — AMPHETAMINE-DEXTROAMPHET ER 30 MG PO CP24
30.0000 mg | ORAL_CAPSULE | Freq: Every day | ORAL | 0 refills | Status: AC
  Filled 2022-11-12: qty 30, 30d supply, fill #0

## 2022-11-12 MED ORDER — TRINTELLIX 10 MG PO TABS
10.0000 mg | ORAL_TABLET | Freq: Every day | ORAL | 2 refills | Status: DC
  Filled 2022-11-12: qty 90, 90d supply, fill #0
  Filled 2023-02-28: qty 90, 90d supply, fill #1
  Filled 2023-06-02: qty 90, 90d supply, fill #2

## 2022-11-13 ENCOUNTER — Other Ambulatory Visit (HOSPITAL_COMMUNITY): Payer: Self-pay

## 2022-11-13 MED ORDER — AMPHETAMINE-DEXTROAMPHET ER 30 MG PO CP24
30.0000 mg | ORAL_CAPSULE | Freq: Every day | ORAL | 0 refills | Status: DC
  Filled 2022-11-13 – 2022-12-15 (×2): qty 30, 30d supply, fill #0

## 2022-11-23 ENCOUNTER — Other Ambulatory Visit: Payer: Self-pay

## 2022-11-23 ENCOUNTER — Other Ambulatory Visit (HOSPITAL_COMMUNITY): Payer: Self-pay

## 2022-12-15 ENCOUNTER — Other Ambulatory Visit (HOSPITAL_COMMUNITY): Payer: Self-pay

## 2022-12-17 ENCOUNTER — Other Ambulatory Visit (HOSPITAL_COMMUNITY): Payer: Self-pay

## 2022-12-19 ENCOUNTER — Other Ambulatory Visit (HOSPITAL_COMMUNITY): Payer: Self-pay

## 2022-12-20 ENCOUNTER — Other Ambulatory Visit (HOSPITAL_COMMUNITY): Payer: Self-pay

## 2022-12-20 MED ORDER — GABAPENTIN 600 MG PO TABS
ORAL_TABLET | ORAL | 3 refills | Status: DC
  Filled 2022-12-20: qty 150, 30d supply, fill #0
  Filled 2023-01-29: qty 150, 30d supply, fill #1
  Filled 2023-02-28: qty 150, 30d supply, fill #2
  Filled 2023-03-27: qty 150, 30d supply, fill #3

## 2022-12-25 ENCOUNTER — Other Ambulatory Visit: Payer: Self-pay

## 2023-01-08 ENCOUNTER — Encounter: Payer: Self-pay | Admitting: Internal Medicine

## 2023-01-08 ENCOUNTER — Ambulatory Visit: Payer: Commercial Managed Care - PPO | Attending: Internal Medicine | Admitting: Internal Medicine

## 2023-01-08 ENCOUNTER — Other Ambulatory Visit (HOSPITAL_COMMUNITY): Payer: Self-pay

## 2023-01-08 VITALS — BP 118/86 | HR 68 | Ht 69.0 in | Wt 161.4 lb

## 2023-01-08 DIAGNOSIS — G909 Disorder of the autonomic nervous system, unspecified: Secondary | ICD-10-CM

## 2023-01-08 MED ORDER — FLUDROCORTISONE ACETATE 0.1 MG PO TABS: 0.1000 mg | ORAL_TABLET | Freq: Two times a day (BID) | ORAL | 3 refills | Status: DC

## 2023-01-08 MED ORDER — FLUDROCORTISONE ACETATE 0.1 MG PO TABS
0.1000 mg | ORAL_TABLET | Freq: Two times a day (BID) | ORAL | 3 refills | Status: DC
  Filled 2023-01-08: qty 180, 90d supply, fill #0

## 2023-01-08 NOTE — Progress Notes (Unsigned)
HPI Rachel Griffith returns today for followup. She is a pleasant 37 yo woman with anxiety and neurally mediated syncope. She has improved in the interim. She has not passed out. She notes that on florinef she has improved. She is exercising.  Allergies  Allergen Reactions   Almond Mea Anaphylaxis   Cymbalta [Duloxetine Hcl]    Duloxetine    Other     Almonds   Sulfa Antibiotics Hives   Trazodone And Nefazodone     Sever body pain    Sudafed [Pseudoephedrine Hcl] Palpitations     Current Outpatient Medications  Medication Sig Dispense Refill   albuterol (VENTOLIN HFA) 108 (90 Base) MCG/ACT inhaler Inhale 2 puffs into the lungs every 4 (four) hours as needed for wheezing or shortness of breath.     albuterol (VENTOLIN HFA) 108 (90 Base) MCG/ACT inhaler INHALE 2 PUFFS BY MOUTH EVERY 4 HOURS AS NEEDED (Patient not taking: Reported on 05/11/2022) 18 g 2   ALPRAZolam (XANAX) 0.5 MG tablet Take 1 tablet by mouth every day as needed for anxiety. (Patient not taking: Reported on 10/05/2022) 30 tablet 2   ALPRAZolam (XANAX) 0.5 MG tablet Take 1 tablet (0.5 mg total) by mouth daily as needed for anxiety 30 tablet 2   amphetamine-dextroamphetamine (ADDERALL XR) 25 MG 24 hr capsule Take 1 capsule by mouth daily. (Patient not taking: Reported on 10/05/2022) 30 capsule 0   amphetamine-dextroamphetamine (ADDERALL XR) 25 MG 24 hr capsule Take 1 capsule by mouth daily. (Patient not taking: Reported on 01/08/2023) 30 capsule 0   amphetamine-dextroamphetamine (ADDERALL XR) 30 MG 24 hr capsule Take 1 capsule (30 mg total) by mouth daily. 30 capsule 0   amphetamine-dextroamphetamine (ADDERALL XR) 30 MG 24 hr capsule Take 1 capsule (30 mg total) by mouth daily. (Patient not taking: Reported on 01/08/2023) 30 capsule 0   desogestrel-ethinyl estradiol (APRI) 0.15-30 MG-MCG tablet 1 tablet Orally Once a day (Patient not taking: Reported on 10/05/2022)     desogestrel-ethinyl estradiol (CYRED EQ) 0.15-30 MG-MCG  tablet Take 1 tablet by mouth daily. Take one by mouth daily, skip placebo pills. 84 tablet 4   fludrocortisone (FLORINEF) 0.1 MG tablet Take 1 tablet (0.1 mg total) by mouth 2 (two) times daily. 180 tablet 3   gabapentin (NEURONTIN) 600 MG tablet Take 1 tablet by mouth 2 times daily and 3 tablets at bedtime 150 tablet 3   ibuprofen (ADVIL) 200 MG tablet Take 800 mg by mouth every 6 (six) hours as needed for mild pain or moderate pain.     levocetirizine (XYZAL) 5 MG tablet Take 5 mg by mouth daily.     methocarbamol (ROBAXIN) 500 MG tablet Take 1 tablet (500 mg total) by mouth every 8 (eight) hours as needed for muscle spasms. Do not take with alcohol or while driving or operating heavy machinery.  May cause drowsiness. 30 tablet 0   methocarbamol (ROBAXIN) 500 MG tablet Take 1 tablet (500 mg total) by mouth every 6 (six) hours as needed for muscle spasms 28 tablet 1   methylPREDNISolone (MEDROL DOSEPAK) 4 MG TBPK tablet Follow package directions 21 tablet 0   Potassium Chloride ER 20 MEQ TBCR Take 1 tablet (20 mEq total) by mouth daily. 30 tablet 2   predniSONE (STERAPRED UNI-PAK 21 TAB) 10 MG (21) TBPK tablet Take per package instructions (Patient not taking: Reported on 10/05/2022) 21 each 0   vortioxetine HBr (TRINTELLIX) 10 MG TABS tablet Take 1 tablet by mouth once daily (  Patient not taking: Reported on 10/05/2022) 90 tablet 0   vortioxetine HBr (TRINTELLIX) 10 MG TABS tablet Take 1 tablet (10 mg total) by mouth daily. 90 tablet 2   No current facility-administered medications for this visit.     Past Medical History:  Diagnosis Date   Anxiety    Back injury    Chronic pelvic pain in female    Endometriosis    Endometriosis determined by laparoscopy 10/06/2014   Ovarian cyst    Pelvic pain in female 10/06/2014   PONV (postoperative nausea and vomiting)    pt states that with 1st arthroscopy she "passed out and couldnt left her head" but they tell me it was a combination of the pain meds  and anesthesia" No problems with next surgery. Not sure of meds given in anesthesia.   POTS (postural orthostatic tachycardia syndrome)    pt reports this was diagnosed a couple of months ago by PCP (Dr Annia Friendly)   PTSD (post-traumatic stress disorder)     ROS:   All systems reviewed and negative except as noted in the HPI.   Past Surgical History:  Procedure Laterality Date   ABDOMINAL HYSTERECTOMY     endometrosis  2014   fulgeration-[facility]   KNEE SURGERY Bilateral    x2   LAPAROSCOPIC BILATERAL SALPINGO OOPHERECTOMY Left 07/31/2018   Procedure: LAPAROSCOPIC LEFT SALPINGO OOPHORECTOMY;  Surgeon: Lazaro Arms, MD;  Location: [facility];  Service: Gynecology;  Laterality: Left;   LAPAROSCOPY N/A 11/26/2014   Procedure: DIAGNOSTIC LAPAROSCOPY;  Surgeon: [name], MD;  Location: [facility];  Service: Gynecology;  Laterality: N/A;   MICRODISCECTOMY LUMBAR  07/01/2020     Family History  Problem Relation Age of Onset   Dementia Maternal Grandmother    Parkinson's disease Maternal Grandfather    Cancer Father        prostate   Other Brother        Lyme Disease   Other Sister        IBS   Thrombocytopenia Sister      Social History   Socioeconomic History   Marital status: Divorced    Spouse name: Not on file   Number of children: Not on file   Years of education: Not on file   Highest education level: Not on file  Occupational History   Not on file  Tobacco Use   Smoking status: Former    Current packs/day: 0.00    Types: Cigarettes, E-cigarettes    Quit date: 02/28/2019    Years since quitting: 3.8   Smokeless tobacco: Never  Vaping Use   Vaping status: Former  Substance and Sexual Activity   Alcohol use: Yes    Comment: occ   Drug use: No   Sexual activity: Not Currently    Birth control/protection: Surgical    Comment: hyst  Other Topics Concern   Not on file  Social History Narrative   Not on file   Social Determinants of Health   Financial Resource  Strain: Not on file  Food Insecurity: Not on file  Transportation Needs: Not on file  Physical Activity: Not on file  Stress: Not on file  Social Connections: Unknown (07/08/2021)   Received from [facility], [facility]   Social Network    Social Network: Not on file  Intimate Partner Violence: Unknown (05/31/2021)   Received from [facility], [facility]   HITS    Physically Hurt: Not on file    Insult or Talk  Down To: Not on file    Threaten Physical Harm: Not on file    Scream or Curse: Not on file     BP 118/86   Pulse 68   Ht 5\' 9"  (1.753 m)   Wt 161 lb 6.4 oz (73.2 kg)   LMP 09/19/2014 Comment: post LAVH  SpO2 97%   BMI 23.83 kg/m   Physical Exam:  Well appearing NAD HEENT: Unremarkable Neck:  No JVD, no thyromegally Lymphatics:  No adenopathy Back:  No CVA tenderness Lungs:  Clear HEART:  Regular rate rhythm, no murmurs, no rubs, no clicks Abd:  soft, positive bowel sounds, no organomegally, no rebound, no guarding Ext:  2 plus pulses, no edema, no cyanosis, no clubbing Skin:  No rashes no nodules Neuro:  CN II through XII intact, motor grossly intact  Assess/Plan:  Autonomic dysfunction - she is doing well and I have recommended she continue her current meds, avoid caffeine an ETOH and to call us if her symptoms worsen. Anxiety - her symptoms appear improved. We will follow.   Sharlot Gowda Javarion Douty,MD

## 2023-01-08 NOTE — Patient Instructions (Signed)
Medication Instructions:  Your physician recommends that you continue on your current medications as directed. Please refer to the Current Medication list given to you today.  *If you need a refill on your cardiac medications before your next appointment, please call your pharmacy*   Lab Work: NONE   If you have labs (blood work) drawn today and your tests are completely normal, you will receive your results only by: MyChart Message (if you have MyChart) OR A paper copy in the mail If you have any lab test that is abnormal or we need to change your treatment, we will call you to review the results.   Testing/Procedures: NONE    Follow-Up: At [facility], you and your health needs are our priority.  As part of our continuing mission to provide you with exceptional heart care, we have created designated Provider Care Teams.  These Care Teams include your primary Cardiologist (physician) and Advanced Practice Providers (APPs -  Physician Assistants and Nurse Practitioners) who all work together to provide you with the care you need, when you need it.  We recommend signing up for the patient portal called "MyChart".  Sign up information is provided on this After Visit Summary.  MyChart is used to connect with patients for Virtual Visits (Telemedicine).  Patients are able to view lab/test results, encounter notes, upcoming appointments, etc.  Non-urgent messages can be sent to your provider as well.   To learn more about what you can do with MyChart, go to [email or web address].    Your next appointment:      Provider:   Lewayne Bunting, MD    Other Instructions Thank you for choosing [facility]!

## 2023-01-09 ENCOUNTER — Other Ambulatory Visit (HOSPITAL_COMMUNITY): Payer: Self-pay

## 2023-01-14 ENCOUNTER — Other Ambulatory Visit (HOSPITAL_COMMUNITY): Payer: Self-pay

## 2023-01-14 MED ORDER — AMPHETAMINE-DEXTROAMPHET ER 30 MG PO CP24
30.0000 mg | ORAL_CAPSULE | Freq: Every day | ORAL | 0 refills | Status: DC
  Filled 2023-01-14: qty 30, 30d supply, fill #0

## 2023-01-17 ENCOUNTER — Other Ambulatory Visit (HOSPITAL_COMMUNITY): Payer: Self-pay

## 2023-01-29 ENCOUNTER — Other Ambulatory Visit: Payer: Self-pay

## 2023-01-29 ENCOUNTER — Other Ambulatory Visit (HOSPITAL_COMMUNITY): Payer: Self-pay

## 2023-01-30 ENCOUNTER — Other Ambulatory Visit (HOSPITAL_COMMUNITY): Payer: Self-pay

## 2023-01-30 MED ORDER — ALPRAZOLAM 0.5 MG PO TABS
0.5000 mg | ORAL_TABLET | Freq: Every day | ORAL | 2 refills | Status: DC | PRN
  Filled 2023-01-30: qty 30, 30d supply, fill #0
  Filled 2023-02-28: qty 30, 30d supply, fill #1
  Filled 2023-03-27 – 2023-04-05 (×2): qty 30, 30d supply, fill #2

## 2023-02-07 DIAGNOSIS — M5416 Radiculopathy, lumbar region: Secondary | ICD-10-CM | POA: Diagnosis not present

## 2023-02-22 ENCOUNTER — Other Ambulatory Visit (HOSPITAL_COMMUNITY): Payer: Self-pay

## 2023-02-22 MED ORDER — AMPHETAMINE-DEXTROAMPHET ER 30 MG PO CP24
30.0000 mg | ORAL_CAPSULE | Freq: Every day | ORAL | 0 refills | Status: DC
  Filled 2023-02-22: qty 30, 30d supply, fill #0

## 2023-02-28 ENCOUNTER — Other Ambulatory Visit (HOSPITAL_COMMUNITY): Payer: Self-pay

## 2023-03-01 ENCOUNTER — Other Ambulatory Visit: Payer: Self-pay

## 2023-03-01 ENCOUNTER — Other Ambulatory Visit (HOSPITAL_COMMUNITY): Payer: Self-pay

## 2023-03-01 MED ORDER — METHOCARBAMOL 500 MG PO TABS
500.0000 mg | ORAL_TABLET | Freq: Four times a day (QID) | ORAL | 1 refills | Status: DC | PRN
  Filled 2023-03-01: qty 28, 7d supply, fill #0
  Filled 2023-03-27: qty 28, 7d supply, fill #1

## 2023-03-17 ENCOUNTER — Ambulatory Visit
Admission: EM | Admit: 2023-03-17 | Discharge: 2023-03-17 | Disposition: A | Discharge status: Home / Self Care | Payer: Commercial Managed Care - PPO | Attending: Nurse Practitioner | Admitting: Nurse Practitioner

## 2023-03-17 DIAGNOSIS — M546 Pain in thoracic spine: Secondary | ICD-10-CM

## 2023-03-17 DIAGNOSIS — G8929 Other chronic pain: Secondary | ICD-10-CM | POA: Diagnosis not present

## 2023-03-17 MED ORDER — DEXAMETHASONE SODIUM PHOSPHATE 10 MG/ML IJ SOLN
10.0000 mg | INTRAMUSCULAR | Status: AC
  Administered 2023-03-17: 10 mg via INTRAMUSCULAR

## 2023-03-17 MED ORDER — PREDNISONE 20 MG PO TABS: 40.0000 mg | ORAL_TABLET | Freq: Every day | ORAL | 0 refills | Status: AC

## 2023-03-17 NOTE — ED Provider Notes (Signed)
[facility]    CSN: [id number] Arrival date & time: 03/17/23  1452      History   Chief Complaint No chief complaint on file.   HPI Rachel Griffith is a 38 y.o. female.   The history is provided by the patient.   Patient presenting for complaints of pain between her bilateral shoulder blades that has been present x  1 day. She states she has pain in her lower rib cage, worse with movement or deep breathing. She does not recall an inciting event, but noted during triage she had performed a lot of lifting at work and over the past week.  Symptoms significantly worsened today despite heat, rest, ibuprofen. Has a history of costochondritis that felt just like this in the past. Denies chest pain, shortness of breath, wheezing, headache, dizziness, injury.   Past Medical History:  Diagnosis Date   Anxiety    Back injury    Chronic pelvic pain in female    Endometriosis    Endometriosis determined by laparoscopy 10/06/2014   Ovarian cyst    Pelvic pain in female 10/06/2014   PONV (postoperative nausea and vomiting)    pt states that with 1st arthroscopy she "passed out and couldnt left her head" but they tell me it was a combination of the pain meds and anesthesia" No problems with next surgery. Not sure of meds given in anesthesia.   POTS (postural orthostatic tachycardia syndrome)    pt reports this was diagnosed a couple of months ago by PCP (Dr Annia Friendly)   PTSD (post-traumatic stress disorder)     Patient Active Problem List   Diagnosis Date Noted   Headache 07/10/2020   Left ovarian cyst 07/31/2018   Ovarian cyst, bilateral 02/08/2016   Postoperative abdominal pain 11/24/2014   Status post laparoscopic assisted vaginal hysterectomy (LAVH) 11/23/2014   Pelvic pain in female 10/06/2014   Endometriosis determined by laparoscopy 10/06/2014    Past Surgical History:  Procedure Laterality Date   ABDOMINAL HYSTERECTOMY     endometrosis  2014   fulgeration-[facility]    KNEE SURGERY Bilateral    x2   LAPAROSCOPIC BILATERAL SALPINGO OOPHERECTOMY Left 07/31/2018   Procedure: LAPAROSCOPIC LEFT SALPINGO OOPHORECTOMY;  Surgeon: Lazaro Arms, MD;  Location: [facility];  Service: Gynecology;  Laterality: Left;   LAPAROSCOPY N/A 11/26/2014   Procedure: DIAGNOSTIC LAPAROSCOPY;  Surgeon: [name], MD;  Location: [facility];  Service: Gynecology;  Laterality: N/A;   MICRODISCECTOMY LUMBAR  07/01/2020    OB History     Gravida  2   Para  2   Term  2   Preterm      AB      Living  2      SAB      IAB      Ectopic      Multiple      Live Births               Home Medications    Prior to Admission medications   Medication Sig Start Date End Date Taking? Authorizing Provider  albuterol (VENTOLIN HFA) 108 (90 Base) MCG/ACT inhaler Inhale 2 puffs into the lungs every 4 (four) hours as needed for wheezing or shortness of breath. 04/19/19   [provider]  albuterol (VENTOLIN HFA) 108 (90 Base) MCG/ACT inhaler INHALE 2 PUFFS BY MOUTH EVERY 4 HOURS AS NEEDED Patient not taking: Reported on 05/11/2022 11/21/20     ALPRAZolam (XANAX) 0.5 MG tablet  Take 1 tablet by mouth every day as needed for anxiety. Patient not taking: Reported on 10/05/2022 10/05/20   Nita Sells, MD  ALPRAZolam Prudy Feeler) 0.5 MG tablet Take 1 tablet (0.5 mg total) by mouth daily as needed for anxiety 01/30/23     amphetamine-dextroamphetamine (ADDERALL XR) 25 MG 24 hr capsule Take 1 capsule by mouth daily. Patient not taking: Reported on 10/05/2022 01/11/22     amphetamine-dextroamphetamine (ADDERALL XR) 25 MG 24 hr capsule Take 1 capsule by mouth daily. Patient not taking: Reported on 01/08/2023 10/04/22     amphetamine-dextroamphetamine (ADDERALL XR) 30 MG 24 hr capsule Take 1 capsule (30 mg total) by mouth daily. 11/12/22     amphetamine-dextroamphetamine (ADDERALL XR) 30 MG 24 hr capsule Take 1 capsule (30 mg total) by mouth daily. 02/22/23     desogestrel-ethinyl estradiol  (APRI) 0.15-30 MG-MCG tablet 1 tablet Orally Once a day Patient not taking: Reported on 10/05/2022    [provider]  desogestrel-ethinyl estradiol (CYRED EQ) 0.15-30 MG-MCG tablet Take 1 tablet by mouth daily. Take one by mouth daily, skip placebo pills. 05/11/22   Lazaro Arms, MD  fludrocortisone (FLORINEF) 0.1 MG tablet Take 1 tablet (0.1 mg total) by mouth 2 (two) times daily. 01/08/23   Marinus Maw, MD  gabapentin (NEURONTIN) 600 MG tablet Take 1 tablet by mouth 2 times daily and 3 tablets at bedtime 12/20/22     ibuprofen (ADVIL) 200 MG tablet Take 800 mg by mouth every 6 (six) hours as needed for mild pain or moderate pain.    [provider]  levocetirizine (XYZAL) 5 MG tablet Take 5 mg by mouth daily.    [provider]  methocarbamol (ROBAXIN) 500 MG tablet Take 1 tablet (500 mg total) by mouth every 8 (eight) hours as needed for muscle spasms. Do not take with alcohol or while driving or operating heavy machinery.  May cause drowsiness. 07/09/22   Valentino Nose, NP  methocarbamol (ROBAXIN) 500 MG tablet Take 1 tablet (500 mg total) by mouth every 6 (six) hours as needed for muscle spasms 03/01/23     Potassium Chloride ER 20 MEQ TBCR Take 1 tablet (20 mEq total) by mouth daily. 08/14/22     predniSONE (STERAPRED UNI-PAK 21 TAB) 10 MG (21) TBPK tablet Take per package instructions Patient not taking: Reported on 10/05/2022 07/09/22   Valentino Nose, NP  vortioxetine HBr (TRINTELLIX) 10 MG TABS tablet Take 1 tablet by mouth once daily Patient not taking: Reported on 10/05/2022 10/04/20     vortioxetine HBr (TRINTELLIX) 10 MG TABS tablet Take 1 tablet (10 mg total) by mouth daily. 11/12/22       Family History Family History  Problem Relation Age of Onset   Dementia Maternal Grandmother    Parkinson's disease Maternal Grandfather    Cancer Father        prostate   Other Brother        Lyme Disease   Other Sister        IBS   Thrombocytopenia Sister      Social History Social History   Tobacco Use   Smoking status: Former    Current packs/day: 0.00    Types: Cigarettes, E-cigarettes    Quit date: 02/28/2019    Years since quitting: 4.0   Smokeless tobacco: Never  Vaping Use   Vaping status: Former  Substance Use Topics   Alcohol use: Yes    Comment: occ   Drug use: No  Allergies   Almond mea, Cymbalta [duloxetine hcl], Duloxetine, Other, Sulfa antibiotics, Trazodone and nefazodone, and Sudafed [pseudoephedrine hcl]   Review of Systems Review of Systems   Physical Exam Triage Vital Signs ED Triage Vitals  Encounter Vitals Group     BP 03/17/23 1458 132/85     Systolic BP Percentile --      Diastolic BP Percentile --      Pulse Rate 03/17/23 1458 67     Resp 03/17/23 1458 17     Temp 03/17/23 1458 (!) 97.5 F (36.4 C)     Temp Source 03/17/23 1458 Oral     SpO2 03/17/23 1458 99 %     Weight --      Height --      Head Circumference --      Peak Flow --      Pain Score 03/17/23 1501 9     Pain Loc --      Pain Education --      Exclude from Growth Chart --    No data found.  Updated Vital Signs BP 132/85 (BP Location: Right Arm)   Pulse 67   Temp (!) 97.5 F (36.4 C) (Oral)   Resp 17   LMP 09/19/2014 Comment: post LAVH  SpO2 99%   Visual Acuity Right Eye Distance:   Left Eye Distance:   Bilateral Distance:    Right Eye Near:   Left Eye Near:    Bilateral Near:     Physical Exam Vitals and nursing note reviewed.  Constitutional:      General: She is not in acute distress.    Appearance: Normal appearance.  HENT:     Head: Normocephalic.  Eyes:     Extraocular Movements: Extraocular movements intact.     Pupils: Pupils are equal, round, and reactive to light.  Cardiovascular:     Pulses: Normal pulses.     Heart sounds: Normal heart sounds.  Pulmonary:     Effort: Pulmonary effort is normal.     Breath sounds: Normal breath sounds.  Abdominal:     General: Bowel sounds are  normal.     Palpations: Abdomen is soft.  Musculoskeletal:     Cervical back: Tenderness present. No swelling, edema, deformity, erythema, lacerations or spasms. Decreased range of motion.       Back:  Skin:    General: Skin is warm and dry.  Neurological:     General: No focal deficit present.     Mental Status: She is alert and oriented to person, place, and time.  Psychiatric:        Mood and Affect: Mood normal.        Behavior: Behavior normal.      UC Treatments / Results  Labs (all labs ordered are listed, but only abnormal results are displayed) Labs Reviewed - No data to display  EKG   Radiology No results found.  Procedures Procedures (including critical care time)  Medications Ordered in UC Medications - No data to display  Initial Impression / Assessment and Plan / UC Course  I have reviewed the triage vital signs and the nursing notes.  Pertinent labs & imaging results that were available during my care of the patient were reviewed by me and considered in my medical decision making (see chart for details).  Suspect patient may be experiencing neuropathic pain.  On exam, lung sounds are clear throughout.  She does have point tenderness in the thoracic spine, no injury  present.  Decadron 10 mg IM administered.  Will treat with prednisone 40 mg for the next 5 days.  She will continue methocarbamol that she is currently taking.  Supportive care recommendations were provided and discussed with the patient to include the use of ice or heat, and gentle stretching or range of motion exercises.  Patient advised to follow-up with her PCP for worsening symptoms.  Patient was in agreement with this plan of care and verbalizes understanding.  All questions were answered.  Patient stable for discharge.   Final Clinical Impressions(s) / UC Diagnoses   Final diagnoses:  None   Discharge Instructions   None    ED Prescriptions   None    PDMP not reviewed this  encounter.   Abran Cantor, NP 03/17/23 1540

## 2023-03-17 NOTE — Discharge Instructions (Addendum)
You have been given an injection of Decadron 10 mg.  Start the prednisone on 03/18/2023.  Continue the methocarbamol that you are currently taking. May take over-the-counter Tylenol as needed for pain, fever, or general discomfort. Continue the use of ice or heat as needed.  Apply ice for pain or swelling, heat for spasm or stiffness.  Apply for 20 minutes, remove for 1 hour, repeat as needed. If symptoms fail to improve with this treatment, please follow-up with your primary care physician for further evaluation. Follow-up as needed.

## 2023-03-17 NOTE — ED Triage Notes (Signed)
Pt reports pain in ribs and collar bone when breathing, when laying down feel as pain in upper  spine. thinks she may have a pinched nerve. Did do a lot of lifting while at work earlier in the past week.

## 2023-03-18 ENCOUNTER — Ambulatory Visit: Payer: Self-pay

## 2023-03-24 ENCOUNTER — Encounter (HOSPITAL_COMMUNITY): Payer: Self-pay | Admitting: Emergency Medicine

## 2023-03-24 ENCOUNTER — Emergency Department (HOSPITAL_COMMUNITY)
Admission: EM | Admit: 2023-03-24 | Discharge: 2023-03-24 | Disposition: A | Discharge status: Home / Self Care | Payer: Commercial Managed Care - PPO | Attending: Emergency Medicine | Admitting: Emergency Medicine

## 2023-03-24 ENCOUNTER — Emergency Department (HOSPITAL_COMMUNITY): Payer: Commercial Managed Care - PPO

## 2023-03-24 ENCOUNTER — Other Ambulatory Visit: Payer: Self-pay

## 2023-03-24 DIAGNOSIS — M25512 Pain in left shoulder: Secondary | ICD-10-CM | POA: Diagnosis not present

## 2023-03-24 DIAGNOSIS — Z87891 Personal history of nicotine dependence: Secondary | ICD-10-CM | POA: Insufficient documentation

## 2023-03-24 DIAGNOSIS — R9431 Abnormal electrocardiogram [ECG] [EKG]: Secondary | ICD-10-CM | POA: Diagnosis not present

## 2023-03-24 MED ORDER — ACETAMINOPHEN 500 MG PO TABS
1000.0000 mg | ORAL_TABLET | Freq: Once | ORAL | Status: AC
  Administered 2023-03-24: 1000 mg via ORAL
  Filled 2023-03-24: qty 2

## 2023-03-24 MED ORDER — OXYCODONE HCL 5 MG PO TABS: 5.0000 mg | ORAL_TABLET | Freq: Four times a day (QID) | ORAL | 0 refills | Status: DC | PRN

## 2023-03-24 MED ORDER — OXYCODONE HCL 5 MG PO TABS
5.0000 mg | ORAL_TABLET | Freq: Once | ORAL | Status: AC
  Administered 2023-03-24: 5 mg via ORAL
  Filled 2023-03-24: qty 1

## 2023-03-24 MED ORDER — KETOROLAC TROMETHAMINE 30 MG/ML IJ SOLN
30.0000 mg | Freq: Once | INTRAMUSCULAR | Status: AC
  Administered 2023-03-24: 30 mg via INTRAMUSCULAR
  Filled 2023-03-24: qty 1

## 2023-03-24 NOTE — ED Provider Notes (Signed)
[facility] AT [facility] Provider Note  CSN: [id number] Arrival date & time: 03/24/23 0206  Chief Complaint(s) Shoulder Pain  HPI JENIKA CHIEM is a 38 y.o. female with past medical history as below, significant for chronic left shoulder pain, POTS, PTSD who presents to the ED with complaint of left shoulder pain  Pain ongoing, worse in the past 1 to 2 weeks.  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  Using OTC analgesia, lidocaine patches, steroids were seen in urgent care.  She Zentz for worsening left shoulder pain.  Similar pain that she has experienced in the past.  Primarily to the posterior aspect of her left shoulder.  No numbness or tingling to her left arm.  No neck pain.  No recent falls or injuries.  Past Medical History Past Medical History:  Diagnosis Date   Anxiety    Back injury    Chronic pelvic pain in female    Endometriosis    Endometriosis determined by laparoscopy 10/06/2014   Ovarian cyst    Pelvic pain in female 10/06/2014   PONV (postoperative nausea and vomiting)    pt states that with 1st arthroscopy she "passed out and couldnt left her head" but they tell me it was a combination of the pain meds and anesthesia" No problems with next surgery. Not sure of meds given in anesthesia.   POTS (postural orthostatic tachycardia syndrome)    pt reports this was diagnosed a couple of months ago by PCP (Dr Annia Friendly)   PTSD (post-traumatic stress disorder)    Patient Active Problem List   Diagnosis Date Noted   Headache 07/10/2020   Left ovarian cyst 07/31/2018   Ovarian cyst, bilateral 02/08/2016   Postoperative abdominal pain 11/24/2014   Status post laparoscopic assisted vaginal hysterectomy (LAVH) 11/23/2014   Pelvic pain in female 10/06/2014   Endometriosis determined by laparoscopy 10/06/2014   Home Medication(s) Prior to Admission medications   Medication Sig Start Date End Date  Taking? Authorizing Provider  albuterol (VENTOLIN HFA) 108 (90 Base) MCG/ACT inhaler Inhale 2 puffs into the lungs every 4 (four) hours as needed for wheezing or shortness of breath. 04/19/19   [provider]  albuterol (VENTOLIN HFA) 108 (90 Base) MCG/ACT inhaler INHALE 2 PUFFS BY MOUTH EVERY 4 HOURS AS NEEDED Patient not taking: Reported on 05/11/2022 11/21/20     ALPRAZolam (XANAX) 0.5 MG tablet Take 1 tablet by mouth every day as needed for anxiety. Patient not taking: Reported on 10/05/2022 10/05/20   Nita Sells, MD  ALPRAZolam Prudy Feeler) 0.5 MG tablet Take 1 tablet (0.5 mg total) by mouth daily as needed for anxiety 01/30/23     amphetamine-dextroamphetamine (ADDERALL XR) 25 MG 24 hr capsule Take 1 capsule by mouth daily. Patient not taking: Reported on 10/05/2022 01/11/22     amphetamine-dextroamphetamine (ADDERALL XR) 25 MG 24 hr capsule Take 1 capsule by mouth daily. Patient not taking: Reported on 01/08/2023 10/04/22     amphetamine-dextroamphetamine (ADDERALL XR) 30 MG 24 hr capsule Take 1 capsule (30 mg total) by mouth daily. 11/12/22     amphetamine-dextroamphetamine (ADDERALL XR) 30 MG 24 hr capsule Take 1 capsule (30 mg total) by mouth daily. 02/22/23     desogestrel-ethinyl estradiol (APRI) 0.15-30 MG-MCG tablet 1 tablet Orally Once a day Patient not taking: Reported on 10/05/2022    [provider]  desogestrel-ethinyl estradiol (CYRED EQ) 0.15-30 MG-MCG tablet Take 1 tablet by mouth daily. Take  one by mouth daily, skip placebo pills. 05/11/22   Lazaro Arms, MD  fludrocortisone (FLORINEF) 0.1 MG tablet Take 1 tablet (0.1 mg total) by mouth 2 (two) times daily. 01/08/23   Marinus Maw, MD  gabapentin (NEURONTIN) 600 MG tablet Take 1 tablet by mouth 2 times daily and 3 tablets at bedtime 12/20/22     ibuprofen (ADVIL) 200 MG tablet Take 800 mg by mouth every 6 (six) hours as needed for mild pain or moderate pain.    [provider]  levocetirizine (XYZAL) 5 MG tablet  Take 5 mg by mouth daily.    [provider]  methocarbamol (ROBAXIN) 500 MG tablet Take 1 tablet (500 mg total) by mouth every 8 (eight) hours as needed for muscle spasms. Do not take with alcohol or while driving or operating heavy machinery.  May cause drowsiness. 07/09/22   Valentino Nose, NP  methocarbamol (ROBAXIN) 500 MG tablet Take 1 tablet (500 mg total) by mouth every 6 (six) hours as needed for muscle spasms 03/01/23     Potassium Chloride ER 20 MEQ TBCR Take 1 tablet (20 mEq total) by mouth daily. 08/14/22     vortioxetine HBr (TRINTELLIX) 10 MG TABS tablet Take 1 tablet by mouth once daily Patient not taking: Reported on 10/05/2022 10/04/20     vortioxetine HBr (TRINTELLIX) 10 MG TABS tablet Take 1 tablet (10 mg total) by mouth daily. 11/12/22                                                                                                                                       Past Surgical History Past Surgical History:  Procedure Laterality Date   ABDOMINAL HYSTERECTOMY     endometrosis  2014   fulgeration-[facility]   KNEE SURGERY Bilateral    x2   LAPAROSCOPIC BILATERAL SALPINGO OOPHERECTOMY Left 07/31/2018   Procedure: LAPAROSCOPIC LEFT SALPINGO OOPHORECTOMY;  Surgeon: Lazaro Arms, MD;  Location: [facility];  Service: Gynecology;  Laterality: Left;   LAPAROSCOPY N/A 11/26/2014   Procedure: DIAGNOSTIC LAPAROSCOPY;  Surgeon: [name], MD;  Location: [facility];  Service: Gynecology;  Laterality: N/A;   MICRODISCECTOMY LUMBAR  07/01/2020   Family History Family History  Problem Relation Age of Onset   Dementia Maternal Grandmother    Parkinson's disease Maternal Grandfather    Cancer Father        prostate   Other Brother        Lyme Disease   Other Sister        IBS   Thrombocytopenia Sister     Social History Social History   Tobacco Use   Smoking status: Former    Current packs/day: 0.00    Types: Cigarettes, E-cigarettes    Quit date: 02/28/2019    Years  since quitting: 4.0   Smokeless tobacco: Never  Vaping Use   Vaping status: Former  Substance  Use Topics   Alcohol use: Yes    Comment: occ   Drug use: No   Allergies Almond mea, Cymbalta [duloxetine hcl], Duloxetine, Other, Sulfa antibiotics, Trazodone and nefazodone, and Sudafed [pseudoephedrine hcl]  Review of Systems Review of Systems  Constitutional:  Negative for chills and fever.  Respiratory:  Negative for chest tightness and shortness of breath.   Cardiovascular:  Negative for chest pain.  Genitourinary:  Negative for dysuria.  Musculoskeletal:  Positive for arthralgias. Negative for back pain.  Neurological:  Negative for weakness and headaches.  All other systems reviewed and are negative.   Physical Exam Vital Signs  I have reviewed the triage vital signs BP 121/72   Pulse 74   Temp 97.7 F (36.5 C)   Resp 18   Ht 5\' 9"  (1.753 m)   Wt 70.3 kg   LMP 09/19/2014 Comment: post LAVH  SpO2 100%   BMI 22.89 kg/m  Physical Exam Vitals and nursing note reviewed.  Constitutional:      General: She is not in acute distress.    Appearance: Normal appearance.  HENT:     Head: Normocephalic and atraumatic.     Right Ear: External ear normal.     Left Ear: External ear normal.     Nose: Nose normal.     Mouth/Throat:     Mouth: Mucous membranes are moist.  Eyes:     General: No scleral icterus.       Right eye: No discharge.        Left eye: No discharge.  Cardiovascular:     Rate and Rhythm: Normal rate and regular rhythm.     Pulses: Normal pulses.     Heart sounds: Normal heart sounds.  Pulmonary:     Effort: Pulmonary effort is normal. No respiratory distress.     Breath sounds: Normal breath sounds. No stridor.  Abdominal:     General: Abdomen is flat. There is no distension.     Palpations: Abdomen is soft.     Tenderness: There is no abdominal tenderness.  Musculoskeletal:       Arms:     Cervical back: No rigidity.       Back:     Right lower  leg: No edema.     Left lower leg: No edema.     Comments: Left upper extremity is NVI She is able to actively lift her left arm overhead. Radial pulses brisk No external signs of trauma  Skin:    General: Skin is warm and dry.     Capillary Refill: Capillary refill takes less than 2 seconds.  Neurological:     Mental Status: She is alert.  Psychiatric:        Mood and Affect: Mood normal.        Behavior: Behavior normal. Behavior is cooperative.     ED Results and Treatments Labs (all labs ordered are listed, but only abnormal results are displayed) Labs Reviewed - No data to display  Radiology DG Shoulder Left Result Date: 03/24/2023 CLINICAL DATA:  Initial evaluation for severe left shoulder pain, no injury. EXAM: LEFT SHOULDER - 2+ VIEW COMPARISON:  None Available. FINDINGS: Osseous mineralization within normal limits. No focal osseous lesions. No acute fracture or dislocation. Humeral head in normal alignment. AC joint approximated. No visible soft tissue abnormality. Visualized lungs are clear. Visualized cardiomediastinal silhouette within normal limits. IMPRESSION: Normal left shoulder radiographs.  No acute osseous abnormality. Electronically Signed   By: Rise Mu M.D.   On: 03/24/2023 02:57    Pertinent labs & imaging results that were available during my care of the patient were reviewed by me and considered in my medical decision making (see MDM for details).  Medications Ordered in ED Medications  ketorolac (TORADOL) 30 MG/ML injection 30 mg (30 mg Intramuscular Given 03/24/23 0327)  oxyCODONE (Oxy IR/ROXICODONE) immediate release tablet 5 mg (5 mg Oral Given 03/24/23 0326)  acetaminophen (TYLENOL) tablet 1,000 mg (1,000 mg Oral Given 03/24/23 0327)                                                                                                                                      Procedures Procedures  (including critical care time)  Medical Decision Making / ED Course    Medical Decision Making:    TANIJA GERMANI is a 38 y.o. female with past medical history as below, significant for chronic left shoulder pain, POTS, PTSD who presents to the ED with complaint of left shoulder pain. The complaint involves an extensive differential diagnosis and also carries with it a high risk of complications and morbidity.  Serious etiology was considered. Ddx includes but is not limited to: Sprain, strain, tendinitis, costochondritis, fracture, dislocation, etc.  Complete initial physical exam performed, notably the patient was in no distress, no hypoxia.    Reviewed and confirmed nursing documentation for past medical history, family history, social history.  Vital signs reviewed.      Clinical Course as of 03/24/23 4098  Wynelle Link Mar 24, 2023  0419 Symptoms greatly improved on repeat evaluation [SG]    Clinical Course User Index [SG] Sloan Leiter, DO    Brief summary:  38 year old female history as above including chronic left shoulder pain here with left shoulder pain.  Minimal pain relief with OTC analgesia and lidocaine patches.  Exam is reassuring.  X-ray stable.  Will give analgesia and reassess.  Advised her to continue using sling.  Light duty at work.  No heavy lifting.  Advised her to follow-up with orthopedics.   The patient improved significantly and was discharged in stable condition. Detailed discussions were had with the patient/guardian regarding current findings, and need for close f/u with PCP or on call doctor. The patient/guardian has been instructed to return immediately if the symptoms worsen in any way for re-evaluation. Patient/guardian verbalized understanding and is in agreement with current care plan. All questions answered  prior to discharge.                Additional history  obtained: -Additional history obtained from family -External records from outside source obtained and reviewed including: Chart review including previous notes, labs, imaging, consultation notes including  PDMP, prior urgent care documentation, prior ED visit, prior cardiology documentation, home medications   Lab Tests: Not applicable EKG   EKG Interpretation Date/Time:  Sunday March 24 2023 03:02:01 EST Ventricular Rate:  81 PR Interval:  90 QRS Duration:  74 QT Interval:  364 QTC Calculation: 423 R Axis:   69  Text Interpretation: Age not entered, assumed to be  38 years old for purpose of ECG interpretation Ectopic atrial rhythm Short PR interval ST elev, probable normal early repol pattern similar to prior Interpretation limited secondary to artifact no stemi Confirmed by Tanda Rockers ([id number]) on 03/24/2023 4:20:03 AM         Imaging Studies ordered: I ordered imaging studies including shoulder xr I independently visualized the following imaging with scope of interpretation limited to determining acute life threatening conditions related to emergency care; findings noted above I independently visualized and interpreted imaging. I agree with the radiologist interpretation   Medicines ordered and prescription drug management: Meds ordered this encounter  Medications   ketorolac (TORADOL) 30 MG/ML injection 30 mg   oxyCODONE (Oxy IR/ROXICODONE) immediate release tablet 5 mg    Refill:  0   acetaminophen (TYLENOL) tablet 1,000 mg    -I have reviewed the patients home medicines and have made adjustments as needed   Consultations Obtained: na   Cardiac Monitoring: Continuous pulse oximetry interpreted by myself, 100% on RA.    Social Determinants of Health:  Diagnosis or treatment significantly limited by social determinants of health: former smoker   Reevaluation: After the interventions noted above, I reevaluated the patient and found that they have  improved  Co morbidities that complicate the patient evaluation  Past Medical History:  Diagnosis Date   Anxiety    Back injury    Chronic pelvic pain in female    Endometriosis    Endometriosis determined by laparoscopy 10/06/2014   Ovarian cyst    Pelvic pain in female 10/06/2014   PONV (postoperative nausea and vomiting)    pt states that with 1st arthroscopy she "passed out and couldnt left her head" but they tell me it was a combination of the pain meds and anesthesia" No problems with next surgery. Not sure of meds given in anesthesia.   POTS (postural orthostatic tachycardia syndrome)    pt reports this was diagnosed a couple of months ago by PCP (Dr Annia Friendly)   PTSD (post-traumatic stress disorder)       Dispostion: Disposition decision including need for hospitalization was considered, and patient discharged from emergency department.    Final Clinical Impression(s) / ED Diagnoses Final diagnoses:  Acute pain of left shoulder        Sloan Leiter, DO 03/24/23 1610

## 2023-03-24 NOTE — ED Triage Notes (Signed)
Pt states she was asleep when she work up with "severe" L shoulder pain.

## 2023-03-24 NOTE — Discharge Instructions (Signed)
It was a pleasure caring for you today in the emergency department.  No heavy lifting for 7 days.  Please follow-up with ortho. Continue to wear  your sling  Please return to the emergency department for any worsening or worrisome symptoms.

## 2023-03-27 ENCOUNTER — Other Ambulatory Visit: Payer: Self-pay

## 2023-03-27 ENCOUNTER — Other Ambulatory Visit (HOSPITAL_COMMUNITY): Payer: Self-pay

## 2023-03-27 MED ORDER — POTASSIUM CHLORIDE ER 20 MEQ PO TBCR
1.0000 | EXTENDED_RELEASE_TABLET | Freq: Every day | ORAL | 2 refills | Status: AC
  Filled 2023-03-27: qty 30, 30d supply, fill #0

## 2023-04-05 ENCOUNTER — Other Ambulatory Visit (HOSPITAL_COMMUNITY): Payer: Self-pay

## 2023-04-05 ENCOUNTER — Other Ambulatory Visit: Payer: Self-pay

## 2023-04-15 ENCOUNTER — Other Ambulatory Visit (HOSPITAL_COMMUNITY): Payer: Self-pay

## 2023-04-15 DIAGNOSIS — E876 Hypokalemia: Secondary | ICD-10-CM | POA: Diagnosis not present

## 2023-04-15 DIAGNOSIS — E785 Hyperlipidemia, unspecified: Secondary | ICD-10-CM | POA: Diagnosis not present

## 2023-04-15 MED ORDER — MONTELUKAST SODIUM 10 MG PO TABS
10.0000 mg | ORAL_TABLET | Freq: Every day | ORAL | 3 refills | Status: DC
  Filled 2023-04-15: qty 90, 90d supply, fill #0

## 2023-04-15 MED ORDER — AMPHETAMINE-DEXTROAMPHET ER 30 MG PO CP24
30.0000 mg | ORAL_CAPSULE | Freq: Every day | ORAL | 0 refills | Status: AC
  Filled 2023-04-15: qty 30, 30d supply, fill #0

## 2023-04-29 ENCOUNTER — Other Ambulatory Visit (HOSPITAL_COMMUNITY): Payer: Self-pay

## 2023-04-29 ENCOUNTER — Other Ambulatory Visit: Payer: Self-pay

## 2023-04-29 MED ORDER — METOCLOPRAMIDE HCL 10 MG PO TABS
ORAL_TABLET | ORAL | 0 refills | Status: DC
  Filled 2023-04-29: qty 28, 7d supply, fill #0

## 2023-05-03 ENCOUNTER — Other Ambulatory Visit (HOSPITAL_COMMUNITY): Payer: Self-pay

## 2023-05-06 ENCOUNTER — Other Ambulatory Visit (HOSPITAL_COMMUNITY): Payer: Self-pay

## 2023-05-06 ENCOUNTER — Ambulatory Visit
Admission: RE | Admit: 2023-05-06 | Discharge: 2023-05-06 | Disposition: A | Discharge status: Home / Self Care | Payer: Self-pay | Source: Ambulatory Visit | Attending: Nurse Practitioner | Admitting: Nurse Practitioner

## 2023-05-06 VITALS — BP 110/74 | HR 85 | Temp 98.3°F | Resp 18

## 2023-05-06 DIAGNOSIS — J0140 Acute pansinusitis, unspecified: Secondary | ICD-10-CM

## 2023-05-06 DIAGNOSIS — Z8709 Personal history of other diseases of the respiratory system: Secondary | ICD-10-CM

## 2023-05-06 LAB — POC COVID19/FLU A&B COMBO
Covid Antigen, POC: NEGATIVE
Influenza A Antigen, POC: NEGATIVE
Influenza B Antigen, POC: NEGATIVE

## 2023-05-06 MED ORDER — ALPRAZOLAM 0.5 MG PO TABS
ORAL_TABLET | ORAL | 2 refills | Status: DC
  Filled 2023-05-06: qty 30, 30d supply, fill #0
  Filled 2023-06-14: qty 30, 30d supply, fill #1
  Filled 2023-07-15: qty 30, 30d supply, fill #2

## 2023-05-06 MED ORDER — AMOXICILLIN-POT CLAVULANATE 875-125 MG PO TABS: 1.0000 | ORAL_TABLET | Freq: Two times a day (BID) | ORAL | 0 refills | Status: DC

## 2023-05-06 MED ORDER — GABAPENTIN 600 MG PO TABS
ORAL_TABLET | Freq: Every day | ORAL | 3 refills | Status: DC
  Filled 2023-05-06: qty 150, 30d supply, fill #0
  Filled 2023-07-15: qty 150, 30d supply, fill #1
  Filled 2023-08-15: qty 150, 30d supply, fill #2
  Filled 2023-09-15: qty 150, 30d supply, fill #3

## 2023-05-06 NOTE — Discharge Instructions (Signed)
 The COVID/flu test was negative. Take medication as directed. Continue your current allergy medication regimen. Increase fluids and get plenty of rest. May take over-the-counter ibuprofen or Tylenol as needed for pain, fever, or general discomfort. Recommend normal saline nasal spray to help with nasal congestion throughout the day. If you develop a cough, it may be helpful to use a humidifier at bedtime during sleep. If your symptoms fail to improve with this treatment, follow-up with your PCP for further evaluation. Follow-up as needed.

## 2023-05-06 NOTE — ED Triage Notes (Signed)
 Sinus pain and pressure, headache x 3 days. Taking dyquil.

## 2023-05-06 NOTE — ED Provider Notes (Signed)
 [facility]    CSN: [id number] Arrival date & time: 05/06/23  0955      History   Chief Complaint Chief Complaint  Patient presents with   Nasal Congestion    Sinus infection - Entered by patient   Facial Pain    HPI Rachel Griffith is a 38 y.o. female.   The history is provided by the patient.   Patient presents with a 3-day history of right sided sinus pressure and tenderness.  She also complains of headache and nasal congestion.  Denies fever, chills, dizziness, lightheadedness, ear pain, ear drainage, wheezing, difficulty breathing, chest pain, abdominal pain, nausea, vomiting, diarrhea, or rash.  Patient reports she normally takes Careers adviser and Astepro for seasonal allergies.  States that she did take NyQuil with some relief.  Past Medical History:  Diagnosis Date   Anxiety    Back injury    Chronic pelvic pain in female    Endometriosis    Endometriosis determined by laparoscopy 10/06/2014   Ovarian cyst    Pelvic pain in female 10/06/2014   PONV (postoperative nausea and vomiting)    pt states that with 1st arthroscopy she "passed out and couldnt left her head" but they tell me it was a combination of the pain meds and anesthesia" No problems with next surgery. Not sure of meds given in anesthesia.   POTS (postural orthostatic tachycardia syndrome)    pt reports this was diagnosed a couple of months ago by PCP (Dr Annia Friendly)   PTSD (post-traumatic stress disorder)     Patient Active Problem List   Diagnosis Date Noted   Headache 07/10/2020   Left ovarian cyst 07/31/2018   Ovarian cyst, bilateral 02/08/2016   Postoperative abdominal pain 11/24/2014   Status post laparoscopic assisted vaginal hysterectomy (LAVH) 11/23/2014   Pelvic pain in female 10/06/2014   Endometriosis determined by laparoscopy 10/06/2014    Past Surgical History:  Procedure Laterality Date   ABDOMINAL HYSTERECTOMY     endometrosis  2014   [facility]   KNEE SURGERY  Bilateral    x2   LAPAROSCOPIC BILATERAL SALPINGO OOPHERECTOMY Left 07/31/2018   Procedure: LAPAROSCOPIC LEFT SALPINGO OOPHORECTOMY;  Surgeon: Lazaro Arms, MD;  Location: [facility];  Service: Gynecology;  Laterality: Left;   LAPAROSCOPY N/A 11/26/2014   Procedure: DIAGNOSTIC LAPAROSCOPY;  Surgeon: [name], MD;  Location: [facility];  Service: Gynecology;  Laterality: N/A;   MICRODISCECTOMY LUMBAR  07/01/2020    OB History     Gravida  2   Para  2   Term  2   Preterm      AB      Living  2      SAB      IAB      Ectopic      Multiple      Live Births               Home Medications    Prior to Admission medications   Medication Sig Start Date End Date Taking? Authorizing Provider  amoxicillin-clavulanate (AUGMENTIN) 875-125 MG tablet Take 1 tablet by mouth every 12 (twelve) hours. 05/06/23  Yes Leath-Warren, Sadie Haber, NP  amphetamine-dextroamphetamine (ADDERALL XR) 30 MG 24 hr capsule Take 1 capsule (30 mg total) by mouth daily. 11/12/22  Yes   desogestrel-ethinyl estradiol (CYRED EQ) 0.15-30 MG-MCG tablet Take 1 tablet by mouth daily. Take one by mouth daily, skip placebo pills. 05/11/22  Yes Lazaro Arms, MD  fludrocortisone (FLORINEF)  0.1 MG tablet Take 1 tablet (0.1 mg total) by mouth 2 (two) times daily. 01/08/23  Yes Marinus Maw, MD  gabapentin (NEURONTIN) 600 MG tablet Take 1 tablet by mouth 2 times daily and 3 tablets at bedtime 05/06/23  Yes   methocarbamol (ROBAXIN) 500 MG tablet Take 1 tablet (500 mg total) by mouth every 8 (eight) hours as needed for muscle spasms. Do not take with alcohol or while driving or operating heavy machinery.  May cause drowsiness. 07/09/22  Yes Valentino Nose, NP  Potassium Chloride ER 20 MEQ TBCR Take 1 tablet (20 mEq total) by mouth daily. 03/27/23  Yes   vortioxetine HBr (TRINTELLIX) 10 MG TABS tablet Take 1 tablet (10 mg total) by mouth daily. 11/12/22  Yes   albuterol (VENTOLIN HFA) 108 (90 Base) MCG/ACT inhaler  Inhale 2 puffs into the lungs every 4 (four) hours as needed for wheezing or shortness of breath. 04/19/19   [provider]  albuterol (VENTOLIN HFA) 108 (90 Base) MCG/ACT inhaler INHALE 2 PUFFS BY MOUTH EVERY 4 HOURS AS NEEDED Patient not taking: Reported on 05/11/2022 11/21/20     ALPRAZolam (XANAX) 0.5 MG tablet Take 1 tablet by mouth every day as needed for anxiety. Patient not taking: Reported on 10/05/2022 10/05/20   Nita Sells, MD  ALPRAZolam Prudy Feeler) 0.5 MG tablet Take 1 tablet (0.5 mg total) by mouth daily as needed for anxiety 05/06/23     amphetamine-dextroamphetamine (ADDERALL XR) 25 MG 24 hr capsule Take 1 capsule by mouth daily. Patient not taking: Reported on 10/05/2022 01/11/22     amphetamine-dextroamphetamine (ADDERALL XR) 25 MG 24 hr capsule Take 1 capsule by mouth daily. Patient not taking: Reported on 01/08/2023 10/04/22     amphetamine-dextroamphetamine (ADDERALL XR) 30 MG 24 hr capsule Take 1 capsule (30 mg total) by mouth daily. 04/15/23     desogestrel-ethinyl estradiol (APRI) 0.15-30 MG-MCG tablet 1 tablet Orally Once a day Patient not taking: Reported on 10/05/2022    [provider]  ibuprofen (ADVIL) 200 MG tablet Take 800 mg by mouth every 6 (six) hours as needed for mild pain or moderate pain.    [provider]  levocetirizine (XYZAL) 5 MG tablet Take 5 mg by mouth daily.    [provider]  methocarbamol (ROBAXIN) 500 MG tablet Take 1 tablet (500 mg total) by mouth every 6 (six) hours as needed for muscle spasms 03/01/23     metoCLOPramide (REGLAN) 10 MG tablet Take 1 tablet 4 times a day by oral route as needed for 7 days. 04/29/23     montelukast (SINGULAIR) 10 MG tablet Take 1 tablet (10 mg total) by mouth daily. 04/15/23     oxyCODONE (ROXICODONE) 5 MG immediate release tablet Take 1 tablet (5 mg total) by mouth every 6 (six) hours as needed for severe pain (pain score 7-10). 03/24/23   Sloan Leiter, DO  vortioxetine HBr (TRINTELLIX) 10 MG  TABS tablet Take 1 tablet by mouth once daily Patient not taking: Reported on 10/05/2022 10/04/20       Family History Family History  Problem Relation Age of Onset   Dementia Maternal Grandmother    Parkinson's disease Maternal Grandfather    Cancer Father        prostate   Other Brother        Lyme Disease   Other Sister        IBS   Thrombocytopenia Sister     Social History Social History  Tobacco Use   Smoking status: Former    Current packs/day: 0.00    Types: Cigarettes, E-cigarettes    Quit date: 02/28/2019    Years since quitting: 4.1   Smokeless tobacco: Never  Vaping Use   Vaping status: Former  Substance Use Topics   Alcohol use: Yes    Comment: occ   Drug use: No     Allergies   Almond meal (obsolete), Cymbalta [duloxetine hcl], Duloxetine, Other, Sulfa antibiotics, Trazodone and nefazodone, and Sudafed [pseudoephedrine hcl]   Review of Systems Review of Systems Per HPI  Physical Exam Triage Vital Signs ED Triage Vitals [05/06/23 1005]  Encounter Vitals Group     BP 110/74     Systolic BP Percentile      Diastolic BP Percentile      Pulse Rate 85     Resp 18     Temp 98.3 F (36.8 C)     Temp Source Oral     SpO2 98 %     Weight      Height      Head Circumference      Peak Flow      Pain Score 3     Pain Loc      Pain Education      Exclude from Growth Chart    No data found.  Updated Vital Signs BP 110/74 (BP Location: Right Arm)   Pulse 85   Temp 98.3 F (36.8 C) (Oral)   Resp 18   LMP 09/19/2014 Comment: post LAVH  SpO2 98%   Visual Acuity Right Eye Distance:   Left Eye Distance:   Bilateral Distance:    Right Eye Near:   Left Eye Near:    Bilateral Near:     Physical Exam Vitals and nursing note reviewed.  Constitutional:      General: She is not in acute distress.    Appearance: Normal appearance.  HENT:     Head: Normocephalic.     Right Ear: Tympanic membrane, ear canal and external ear normal.     Left  Ear: Tympanic membrane, ear canal and external ear normal.     Nose: Congestion present.     Right Turbinates: Enlarged and swollen.     Left Turbinates: Enlarged and swollen.     Right Sinus: Maxillary sinus tenderness and frontal sinus tenderness present.     Left Sinus: No maxillary sinus tenderness or frontal sinus tenderness.     Mouth/Throat:     Lips: Pink.     Mouth: Mucous membranes are moist.     Pharynx: Oropharynx is clear. Uvula midline. Postnasal drip present. No pharyngeal swelling, oropharyngeal exudate, posterior oropharyngeal erythema or uvula swelling.  Eyes:     Extraocular Movements: Extraocular movements intact.     Conjunctiva/sclera: Conjunctivae normal.     Pupils: Pupils are equal, round, and reactive to light.  Cardiovascular:     Rate and Rhythm: Normal rate and regular rhythm.     Pulses: Normal pulses.     Heart sounds: Normal heart sounds.  Pulmonary:     Effort: Pulmonary effort is normal. No respiratory distress.     Breath sounds: Normal breath sounds. No stridor. No wheezing, rhonchi or rales.  Abdominal:     General: Bowel sounds are normal.     Palpations: Abdomen is soft.     Tenderness: There is no abdominal tenderness.  Musculoskeletal:     Cervical back: Normal range of motion.  Lymphadenopathy:  Cervical: No cervical adenopathy.  Skin:    General: Skin is warm and dry.  Neurological:     General: No focal deficit present.     Mental Status: She is alert and oriented to person, place, and time.  Psychiatric:        Mood and Affect: Mood normal.        Behavior: Behavior normal.      UC Treatments / Results  Labs (all labs ordered are listed, but only abnormal results are displayed) Labs Reviewed  POC COVID19/FLU A&B COMBO - Normal    EKG   Radiology No results found.  Procedures Procedures (including critical care time)  Medications Ordered in UC Medications - No data to display  Initial Impression / Assessment  and Plan / UC Course  I have reviewed the triage vital signs and the nursing notes.  Pertinent labs & imaging results that were available during my care of the patient were reviewed by me and considered in my medical decision making (see chart for details).  COVID/flu test was negative.  On exam, patient with moderate maxillary and frontal sinus tenderness.  Will treat with Augmentin 875/2025 mg tablets twice daily for the next 7 days for bacterial pansinusitis.  Supportive care recommendations were provided and discussed with the patient to include fluids, rest, over-the-counter analgesics, and normal saline nasal spray.  Patient was in agreement with this plan of care and verbalized understanding.  All questions were answered.  Patient stable for discharge.  Final Clinical Impressions(s) / UC Diagnoses   Final diagnoses:  Acute pansinusitis, recurrence not specified  History of allergic rhinitis     Discharge Instructions      The COVID/flu test was negative. Take medication as directed. Continue your current allergy medication regimen. Increase fluids and get plenty of rest. May take over-the-counter ibuprofen or Tylenol as needed for pain, fever, or general discomfort. Recommend normal saline nasal spray to help with nasal congestion throughout the day. If you develop a cough, it may be helpful to use a humidifier at bedtime during sleep. If your symptoms fail to improve with this treatment, follow-up with your PCP for further evaluation. Follow-up as needed.     ED Prescriptions     Medication Sig Dispense Auth. Provider   amoxicillin-clavulanate (AUGMENTIN) 875-125 MG tablet Take 1 tablet by mouth every 12 (twelve) hours. 14 tablet Leath-Warren, Sadie Haber, NP      PDMP not reviewed this encounter.   Abran Cantor, NP 05/06/23 1039

## 2023-05-11 ENCOUNTER — Other Ambulatory Visit (HOSPITAL_COMMUNITY): Payer: Self-pay

## 2023-05-19 ENCOUNTER — Other Ambulatory Visit (HOSPITAL_COMMUNITY): Payer: Self-pay

## 2023-05-20 ENCOUNTER — Other Ambulatory Visit (HOSPITAL_COMMUNITY): Payer: Self-pay

## 2023-05-20 MED ORDER — METOCLOPRAMIDE HCL 10 MG PO TABS
10.0000 mg | ORAL_TABLET | Freq: Four times a day (QID) | ORAL | 0 refills | Status: AC | PRN
  Filled 2023-05-20: qty 28, 7d supply, fill #0

## 2023-06-02 ENCOUNTER — Other Ambulatory Visit (HOSPITAL_COMMUNITY): Payer: Self-pay

## 2023-06-03 ENCOUNTER — Other Ambulatory Visit (HOSPITAL_COMMUNITY): Payer: Self-pay

## 2023-06-03 MED ORDER — METHOCARBAMOL 500 MG PO TABS
500.0000 mg | ORAL_TABLET | Freq: Four times a day (QID) | ORAL | 1 refills | Status: AC | PRN
Start: 2023-06-03 — End: ?
  Filled 2023-06-03: qty 28, 7d supply, fill #0
  Filled 2023-07-02: qty 28, 7d supply, fill #1

## 2023-06-10 ENCOUNTER — Other Ambulatory Visit (HOSPITAL_COMMUNITY): Payer: Self-pay

## 2023-06-10 MED ORDER — AMPHETAMINE-DEXTROAMPHET ER 25 MG PO CP24
25.0000 mg | ORAL_CAPSULE | Freq: Every day | ORAL | 0 refills | Status: DC
  Filled 2023-06-10: qty 30, 30d supply, fill #0

## 2023-06-14 ENCOUNTER — Other Ambulatory Visit: Payer: Self-pay

## 2023-06-17 ENCOUNTER — Other Ambulatory Visit (HOSPITAL_COMMUNITY): Payer: Self-pay

## 2023-06-17 MED ORDER — METHYLPREDNISOLONE 4 MG PO TBPK
ORAL_TABLET | ORAL | 0 refills | Status: DC
  Filled 2023-06-17: qty 21, 6d supply, fill #0

## 2023-07-02 ENCOUNTER — Other Ambulatory Visit: Payer: Self-pay | Admitting: Obstetrics & Gynecology

## 2023-07-02 ENCOUNTER — Other Ambulatory Visit: Payer: Self-pay

## 2023-07-02 ENCOUNTER — Other Ambulatory Visit (HOSPITAL_COMMUNITY): Payer: Self-pay

## 2023-07-02 MED ORDER — CYRED EQ 0.15-30 MG-MCG PO TABS
1.0000 | ORAL_TABLET | Freq: Every day | ORAL | 4 refills | Status: AC
  Filled 2023-07-02: qty 84, 63d supply, fill #0
  Filled 2023-09-15: qty 84, 63d supply, fill #1
  Filled 2023-11-20: qty 84, 63d supply, fill #2
  Filled 2024-01-21: qty 84, 63d supply, fill #3
  Filled 2024-03-30: qty 84, 63d supply, fill #4

## 2023-07-09 ENCOUNTER — Other Ambulatory Visit (HOSPITAL_COMMUNITY): Payer: Self-pay

## 2023-07-11 ENCOUNTER — Other Ambulatory Visit (HOSPITAL_COMMUNITY): Payer: Self-pay

## 2023-07-12 ENCOUNTER — Other Ambulatory Visit (HOSPITAL_COMMUNITY): Payer: Self-pay

## 2023-07-12 MED ORDER — AMPHETAMINE-DEXTROAMPHET ER 25 MG PO CP24
25.0000 mg | ORAL_CAPSULE | Freq: Every day | ORAL | 0 refills | Status: DC
Start: 2023-07-12 — End: 2023-08-16
  Filled 2023-07-12: qty 30, 30d supply, fill #0

## 2023-07-16 ENCOUNTER — Other Ambulatory Visit: Payer: Self-pay

## 2023-08-07 ENCOUNTER — Other Ambulatory Visit (HOSPITAL_COMMUNITY): Payer: Self-pay

## 2023-08-07 MED ORDER — METOCLOPRAMIDE HCL 10 MG PO TABS
ORAL_TABLET | ORAL | 3 refills | Status: DC
  Filled 2023-08-07: qty 28, 7d supply, fill #0
  Filled 2023-10-26: qty 28, 7d supply, fill #1
  Filled 2023-11-20: qty 28, 7d supply, fill #2
  Filled 2023-12-18: qty 28, 7d supply, fill #3

## 2023-08-08 ENCOUNTER — Other Ambulatory Visit (HOSPITAL_COMMUNITY): Payer: Self-pay

## 2023-08-08 DIAGNOSIS — M5416 Radiculopathy, lumbar region: Secondary | ICD-10-CM | POA: Diagnosis not present

## 2023-08-08 DIAGNOSIS — M4714 Other spondylosis with myelopathy, thoracic region: Secondary | ICD-10-CM | POA: Diagnosis not present

## 2023-08-08 DIAGNOSIS — M542 Cervicalgia: Secondary | ICD-10-CM | POA: Diagnosis not present

## 2023-08-08 MED ORDER — LIDOCAINE 5 % EX PTCH
1.0000 | MEDICATED_PATCH | Freq: Every day | CUTANEOUS | 2 refills | Status: DC
  Filled 2023-08-08: qty 30, 30d supply, fill #0
  Filled 2023-10-26: qty 30, 30d supply, fill #1
  Filled 2023-12-18: qty 30, 30d supply, fill #2

## 2023-08-09 ENCOUNTER — Other Ambulatory Visit (HOSPITAL_COMMUNITY): Payer: Self-pay

## 2023-08-09 MED ORDER — METHOCARBAMOL 500 MG PO TABS
500.0000 mg | ORAL_TABLET | Freq: Four times a day (QID) | ORAL | 1 refills | Status: DC | PRN
  Filled 2023-08-09: qty 28, 7d supply, fill #0
  Filled 2023-09-15: qty 28, 7d supply, fill #1

## 2023-08-15 ENCOUNTER — Other Ambulatory Visit (HOSPITAL_COMMUNITY): Payer: Self-pay

## 2023-08-16 ENCOUNTER — Other Ambulatory Visit: Payer: Self-pay

## 2023-08-16 ENCOUNTER — Other Ambulatory Visit (HOSPITAL_COMMUNITY): Payer: Self-pay

## 2023-08-16 MED ORDER — AMPHETAMINE-DEXTROAMPHET ER 25 MG PO CP24
25.0000 mg | ORAL_CAPSULE | Freq: Every day | ORAL | 0 refills | Status: DC
  Filled 2023-08-16: qty 30, 30d supply, fill #0

## 2023-08-16 MED ORDER — ALPRAZOLAM 0.5 MG PO TABS
0.5000 mg | ORAL_TABLET | Freq: Every day | ORAL | 2 refills | Status: DC | PRN
  Filled 2023-08-16: qty 30, 30d supply, fill #0
  Filled 2023-09-15: qty 30, 30d supply, fill #1
  Filled 2023-10-20: qty 30, 30d supply, fill #2

## 2023-08-26 ENCOUNTER — Other Ambulatory Visit (HOSPITAL_COMMUNITY): Payer: Self-pay

## 2023-08-27 ENCOUNTER — Other Ambulatory Visit (HOSPITAL_COMMUNITY): Payer: Self-pay

## 2023-08-27 MED ORDER — ALBUTEROL SULFATE HFA 108 (90 BASE) MCG/ACT IN AERS
2.0000 | INHALATION_SPRAY | RESPIRATORY_TRACT | 2 refills | Status: AC | PRN
  Filled 2023-08-27: qty 6.7, 17d supply, fill #0

## 2023-09-06 ENCOUNTER — Other Ambulatory Visit (HOSPITAL_COMMUNITY): Payer: Self-pay

## 2023-09-15 ENCOUNTER — Other Ambulatory Visit (HOSPITAL_COMMUNITY): Payer: Self-pay

## 2023-09-16 ENCOUNTER — Other Ambulatory Visit: Payer: Self-pay

## 2023-09-16 ENCOUNTER — Other Ambulatory Visit (HOSPITAL_COMMUNITY): Payer: Self-pay

## 2023-09-16 MED ORDER — TRINTELLIX 10 MG PO TABS
10.0000 mg | ORAL_TABLET | Freq: Every day | ORAL | 2 refills | Status: AC
  Filled 2023-09-16: qty 90, 90d supply, fill #0
  Filled 2023-12-18: qty 90, 90d supply, fill #1
  Filled 2024-03-27: qty 90, 90d supply, fill #2

## 2023-09-18 ENCOUNTER — Other Ambulatory Visit (HOSPITAL_COMMUNITY): Payer: Self-pay

## 2023-09-25 ENCOUNTER — Other Ambulatory Visit (HOSPITAL_COMMUNITY): Payer: Self-pay

## 2023-09-25 MED ORDER — AMPHETAMINE-DEXTROAMPHET ER 25 MG PO CP24
25.0000 mg | ORAL_CAPSULE | Freq: Every day | ORAL | 0 refills | Status: DC
  Filled 2023-09-25: qty 30, 30d supply, fill #0

## 2023-10-20 ENCOUNTER — Other Ambulatory Visit (HOSPITAL_COMMUNITY): Payer: Self-pay

## 2023-10-21 ENCOUNTER — Other Ambulatory Visit: Payer: Self-pay

## 2023-10-21 ENCOUNTER — Other Ambulatory Visit (HOSPITAL_COMMUNITY): Payer: Self-pay

## 2023-10-21 MED ORDER — METHOCARBAMOL 500 MG PO TABS
500.0000 mg | ORAL_TABLET | Freq: Four times a day (QID) | ORAL | 1 refills | Status: DC | PRN
  Filled 2023-10-21: qty 28, 7d supply, fill #0
  Filled 2023-11-20: qty 28, 7d supply, fill #1

## 2023-10-22 ENCOUNTER — Other Ambulatory Visit (HOSPITAL_COMMUNITY): Payer: Self-pay

## 2023-10-29 ENCOUNTER — Other Ambulatory Visit (HOSPITAL_COMMUNITY): Payer: Self-pay

## 2023-10-29 MED ORDER — AMPHETAMINE-DEXTROAMPHET ER 25 MG PO CP24
25.0000 mg | ORAL_CAPSULE | Freq: Every day | ORAL | 0 refills | Status: DC
  Filled 2023-10-29: qty 30, 30d supply, fill #0

## 2023-11-07 ENCOUNTER — Other Ambulatory Visit (HOSPITAL_COMMUNITY): Payer: Self-pay

## 2023-11-07 MED ORDER — GABAPENTIN 600 MG PO TABS
600.0000 mg | ORAL_TABLET | ORAL | 3 refills | Status: AC
  Filled 2023-11-07: qty 150, 30d supply, fill #0
  Filled 2023-12-18: qty 150, 30d supply, fill #1
  Filled 2024-01-21: qty 150, 30d supply, fill #2
  Filled 2024-03-30: qty 150, 30d supply, fill #3

## 2023-11-20 ENCOUNTER — Other Ambulatory Visit (HOSPITAL_COMMUNITY): Payer: Self-pay

## 2023-11-21 ENCOUNTER — Other Ambulatory Visit (HOSPITAL_COMMUNITY): Payer: Self-pay

## 2023-11-21 ENCOUNTER — Other Ambulatory Visit: Payer: Self-pay

## 2023-11-21 MED ORDER — ALPRAZOLAM 0.5 MG PO TABS
0.5000 mg | ORAL_TABLET | Freq: Every day | ORAL | 2 refills | Status: DC | PRN
  Filled 2023-11-21: qty 30, 30d supply, fill #0
  Filled 2023-12-18: qty 30, 30d supply, fill #1
  Filled 2024-01-21: qty 30, 30d supply, fill #2

## 2023-11-23 ENCOUNTER — Ambulatory Visit
Admission: EM | Admit: 2023-11-23 | Discharge: 2023-11-23 | Disposition: A | Discharge status: Home / Self Care | Attending: Internal Medicine | Admitting: Internal Medicine

## 2023-11-23 DIAGNOSIS — J301 Allergic rhinitis due to pollen: Secondary | ICD-10-CM

## 2023-11-23 DIAGNOSIS — R0981 Nasal congestion: Secondary | ICD-10-CM

## 2023-11-23 LAB — POC SOFIA SARS ANTIGEN FIA: SARS Coronavirus 2 Ag: NEGATIVE

## 2023-11-23 MED ORDER — MONTELUKAST SODIUM 10 MG PO TABS
10.0000 mg | ORAL_TABLET | Freq: Every day | ORAL | 2 refills | Status: DC
  Filled 2023-12-26: qty 30, 30d supply, fill #0
  Filled 2024-01-29: qty 30, 30d supply, fill #1

## 2023-11-23 MED ORDER — PREDNISONE 20 MG PO TABS: 40.0000 mg | ORAL_TABLET | Freq: Every day | ORAL | 0 refills | Status: AC

## 2023-11-23 NOTE — ED Provider Notes (Signed)
 [facility]    CSN: [id number] Arrival date & time: 11/23/23  1055      History   Chief Complaint No chief complaint on file.   HPI Rachel Griffith is a 38 y.o. female.   Rachel Griffith is a 38 y.o. female presenting for chief complaint of nasal congestion, frontal headache, and ear fullness that started 24 to 48 hours ago.  She has had the symptoms chronically due to seasonal allergies and states symptoms worsened over the last few days now that the leaves are beginning to fall and the seasons are changing.  She runs outside almost daily and states that this triggers her allergy symptoms.  Ears always feel full and she has decreased hearing bilaterally.  Nasal congestion/rhinorrhea is clear.  She developed headache to the frontal lateral forehead last night.  Denies vision changes, dizziness, sore throat, fever, chills, cough, and recent sick contacts with similar symptoms.  Denies watery/itchy eyes, nausea, vomiting, rash, and shortness of breath. Takes Allegra for allergies and recently started Nasacort a few days ago with minimal relief. She used Afrin last night which did not help with nasal congestion at all.      Past Medical History:  Diagnosis Date   Anxiety    Back injury    Chronic pelvic pain in female    Endometriosis    Endometriosis determined by laparoscopy 10/06/2014   Ovarian cyst    Pelvic pain in female 10/06/2014   PONV (postoperative nausea and vomiting)    pt states that with 1st arthroscopy she passed out and couldnt left her head but they tell me it was a combination of the pain meds and anesthesia No problems with next surgery. Not sure of meds given in anesthesia.   POTS (postural orthostatic tachycardia syndrome)    pt reports this was diagnosed a couple of months ago by PCP (Dr Jarrett)   PTSD (post-traumatic stress disorder)     Patient Active Problem List   Diagnosis Date Noted   Headache 07/10/2020   Left ovarian cyst  07/31/2018   Ovarian cyst, bilateral 02/08/2016   Postoperative abdominal pain 11/24/2014   Status post laparoscopic assisted vaginal hysterectomy (LAVH) 11/23/2014   Pelvic pain in female 10/06/2014   Endometriosis determined by laparoscopy 10/06/2014    Past Surgical History:  Procedure Laterality Date   ABDOMINAL HYSTERECTOMY     endometrosis  2014   fulger[facility]   KNEE SURGERY Bilateral    x2   LAPAROSCOPIC BILATERAL SALPINGO OOPHERECTOMY Left 07/31/2018   Procedure: LAPAROSCOPIC LEFT SALPINGO OOPHORECTOMY;  Surgeon: Jayne Vonn VEAR, MD;  Location: [facility];  Service: Gynecology;  Laterality: Left;   LAPAROSCOPY N/A 11/26/2014   Procedure: DIAGNOSTIC LAPAROSCOPY;  Surgeon: Norleen Edsel GAILS, MD;  Location: [facility];  Service: Gynecology;  Laterality: N/A;   MICRODISCECTOMY LUMBAR  07/01/2020    OB History     Gravida  2   Para  2   Term  2   Preterm      AB      Living  2      SAB      IAB      Ectopic      Multiple      Live Births               Home Medications    Prior to Admission medications   Medication Sig Start Date End Date Taking? Authorizing Provider  montelukast  (SINGULAIR ) 10 MG tablet Take 1  tablet (10 mg total) by mouth at bedtime. 11/23/23  Yes Enedelia Dorna HERO, FNP  predniSONE  (DELTASONE ) 20 MG tablet Take 2 tablets (40 mg total) by mouth daily with breakfast for 5 days. 11/23/23 11/28/23 Yes StanhopeDorna HERO, FNP  albuterol  (VENTOLIN  HFA) 108 (90 Base) MCG/ACT inhaler Inhale 2 puffs into the lungs every 4 (four) hours as needed for wheezing or shortness of breath. 04/19/19   [provider]  albuterol  (VENTOLIN  HFA) 108 (90 Base) MCG/ACT inhaler INHALE 2 PUFFS BY MOUTH EVERY 4 HOURS AS NEEDED Patient not taking: Reported on 05/11/2022 11/21/20     albuterol  (VENTOLIN  HFA) 108 (90 Base) MCG/ACT inhaler Inhale 2 puffs into the lungs every 4 (four) hours as needed. 08/27/23     ALPRAZolam  (XANAX ) 0.5 MG tablet Take 1 tablet by  mouth every day as needed for anxiety. Patient not taking: Reported on 10/05/2022 10/05/20   Shona Rush, MD  ALPRAZolam  (XANAX ) 0.5 MG tablet Take 1 tablet (0.5 mg total) by mouth daily as needed for anxiety 11/21/23     amoxicillin -clavulanate (AUGMENTIN ) 875-125 MG tablet Take 1 tablet by mouth every 12 (twelve) hours. 05/06/23   Leath-Warren, Etta PARAS, NP  amphetamine -dextroamphetamine  (ADDERALL  XR) 25 MG 24 hr capsule Take 1 capsule by mouth daily. Patient not taking: Reported on 10/05/2022 01/11/22     amphetamine -dextroamphetamine  (ADDERALL  XR) 25 MG 24 hr capsule Take 1 capsule by mouth daily. Patient not taking: Reported on 01/08/2023 10/04/22     amphetamine -dextroamphetamine  (ADDERALL  XR) 25 MG 24 hr capsule Take 1 capsule by mouth daily. 10/29/23     amphetamine -dextroamphetamine  (ADDERALL  XR) 30 MG 24 hr capsule Take 1 capsule (30 mg total) by mouth daily. 11/12/22     amphetamine -dextroamphetamine  (ADDERALL  XR) 30 MG 24 hr capsule Take 1 capsule (30 mg total) by mouth daily. 04/15/23     desogestrel -ethinyl estradiol  (APRI ) 0.15-30 MG-MCG tablet 1 tablet Orally Once a day Patient not taking: Reported on 10/05/2022    [provider]  desogestrel -ethinyl estradiol  (CYRED EQ ) 0.15-30 MG-MCG tablet Take 1 tablet by mouth daily. Take one by mouth daily, skip placebo pills. 07/02/23   Jayne Vonn DEL, MD  fludrocortisone  (FLORINEF ) 0.1 MG tablet Take 1 tablet (0.1 mg total) by mouth 2 (two) times daily. 01/08/23   Waddell Danelle ORN, MD  gabapentin  (NEURONTIN ) 600 MG tablet Take 1 tablet (600 mg total) by mouth 2 times daily and take 3 tablets by mouth at bedtime 11/07/23     ibuprofen  (ADVIL ) 200 MG tablet Take 800 mg by mouth every 6 (six) hours as needed for mild pain or moderate pain.    [provider]  levocetirizine (XYZAL ) 5 MG tablet Take 5 mg by mouth daily.    [provider]  lidocaine  (LIDODERM ) 5 % Place 1 patch onto the skin daily. May wear up to 12 hours.  Leave off  for 12 hours. 08/08/23     methocarbamol  (ROBAXIN ) 500 MG tablet Take 1 tablet (500 mg total) by mouth every 8 (eight) hours as needed for muscle spasms. Do not take with alcohol or while driving or operating heavy machinery.  May cause drowsiness. 07/09/22   Chandra Harlene LABOR, NP  methocarbamol  (ROBAXIN ) 500 MG tablet Take 1 tablet (500 mg total) by mouth every 6 (six) hours as needed for muscle spasms 10/21/23     metoCLOPramide  (REGLAN ) 10 MG tablet Take 1 tablet by mouth 4 times a day as needed for 7 days. 08/07/23     oxyCODONE  (  ROXICODONE ) 5 MG immediate release tablet Take 1 tablet (5 mg total) by mouth every 6 (six) hours as needed for severe pain (pain score 7-10). 03/24/23   Elnor Jayson LABOR, DO  Potassium Chloride  ER 20 MEQ TBCR Take 1 tablet (20 mEq total) by mouth daily. 03/27/23     vortioxetine  HBr (TRINTELLIX ) 10 MG TABS tablet Take 1 tablet by mouth once daily Patient not taking: Reported on 10/05/2022 10/04/20     vortioxetine  HBr (TRINTELLIX ) 10 MG TABS tablet Take 1 tablet (10 mg total) by mouth daily. 09/16/23       Family History Family History  Problem Relation Age of Onset   Dementia Maternal Grandmother    Parkinson's disease Maternal Grandfather    Cancer Father        prostate   Other Brother        Lyme Disease   Other Sister        IBS   Thrombocytopenia Sister     Social History Social History   Tobacco Use   Smoking status: Former    Current packs/day: 0.00    Types: Cigarettes, E-cigarettes    Quit date: 02/28/2019    Years since quitting: 4.7   Smokeless tobacco: Never  Vaping Use   Vaping status: Former  Substance Use Topics   Alcohol use: Yes    Comment: occ   Drug use: No     Allergies   Almond meal (obsolete), Cymbalta [duloxetine hcl], Duloxetine, Other, Sulfa antibiotics, Trazodone and nefazodone, and Sudafed [pseudoephedrine hcl]   Review of Systems Review of Systems Per HPI  Physical Exam Triage Vital Signs ED Triage Vitals  Encounter  Vitals Group     BP 11/23/23 1133 126/79     Girls Systolic BP Percentile --      Girls Diastolic BP Percentile --      Boys Systolic BP Percentile --      Boys Diastolic BP Percentile --      Pulse Rate 11/23/23 1133 69     Resp 11/23/23 1133 18     Temp 11/23/23 1133 98.3 F (36.8 C)     Temp Source 11/23/23 1133 Oral     SpO2 11/23/23 1133 98 %     Weight --      Height --      Head Circumference --      Peak Flow --      Pain Score 11/23/23 1134 0     Pain Loc --      Pain Education --      Exclude from Growth Chart --    No data found.  Updated Vital Signs BP 126/79 (BP Location: Right Arm)   Pulse 69   Temp 98.3 F (36.8 C) (Oral)   Resp 18   LMP 09/19/2014 Comment: post LAVH  SpO2 98%   Visual Acuity Right Eye Distance:   Left Eye Distance:   Bilateral Distance:    Right Eye Near:   Left Eye Near:    Bilateral Near:     Physical Exam Vitals and nursing note reviewed.  Constitutional:      Appearance: She is not ill-appearing or toxic-appearing.  HENT:     Head: Normocephalic and atraumatic.     Jaw: There is normal jaw occlusion.     Right Ear: Hearing, ear canal and external ear normal. Tympanic membrane is bulging. Tympanic membrane is not erythematous or retracted.     Left Ear: Hearing, ear canal and external  ear normal. Tympanic membrane is bulging. Tympanic membrane is not erythematous or retracted.     Ears:     Comments: Fluid level behind both eardrums.    Nose: Congestion present.     Right Turbinates: Swollen and pale.     Left Turbinates: Swollen and pale.     Mouth/Throat:     Lips: Pink.     Mouth: Mucous membranes are moist. No injury or oral lesions.     Dentition: Normal dentition.     Tongue: No lesions.     Pharynx: Oropharynx is clear. Uvula midline. Posterior oropharyngeal erythema and postnasal drip present. No pharyngeal swelling, oropharyngeal exudate or uvula swelling.     Tonsils: No tonsillar exudate.     Comments:  Streaking clear postnasal drip to the posterior oropharynx with minimal erythema. Eyes:     General: Lids are normal. Vision grossly intact. Gaze aligned appropriately.     Extraocular Movements: Extraocular movements intact.     Conjunctiva/sclera: Conjunctivae normal.  Neck:     Trachea: Trachea and phonation normal.  Cardiovascular:     Rate and Rhythm: Normal rate and regular rhythm.     Heart sounds: Normal heart sounds, S1 normal and S2 normal.  Pulmonary:     Effort: Pulmonary effort is normal. No respiratory distress.     Breath sounds: Normal breath sounds and air entry.  Musculoskeletal:     Cervical back: Neck supple.  Lymphadenopathy:     Cervical: No cervical adenopathy.  Skin:    General: Skin is warm and dry.     Capillary Refill: Capillary refill takes less than 2 seconds.     Findings: No rash.  Neurological:     General: No focal deficit present.     Mental Status: She is alert and oriented to person, place, and time. Mental status is at baseline.     Cranial Nerves: No dysarthria or facial asymmetry.  Psychiatric:        Mood and Affect: Mood normal.        Speech: Speech normal.        Behavior: Behavior normal.        Thought Content: Thought content normal.        Judgment: Judgment normal.      UC Treatments / Results  Labs (all labs ordered are listed, but only abnormal results are displayed) Labs Reviewed  POC SOFIA SARS ANTIGEN FIA    EKG   Radiology No results found.  Procedures Procedures (including critical care time)  Medications Ordered in UC Medications - No data to display  Initial Impression / Assessment and Plan / UC Course  I have reviewed the triage vital signs and the nursing notes.  Pertinent labs & imaging results that were available during my care of the patient were reviewed by me and considered in my medical decision making (see chart for details).   1.  Seasonal allergic rhinitis due to pollen, nasal  congestion Symptoms due to allergic rhinitis.  She is open to stepping up therapy today and trying Singulair .  She has tried Singulair  in the past but was hesitant to take this due to blackbox warning of mental health side effects.  History of PTSD, anxiety, and depression that is currently well-controlled.  She is reliable and has good follow-up with PCP.  She is open to starting Singulair  today for better control of allergies.  I have advised her to stop taking Singulair  should she develop new/worsening anxiety or depression  symptoms. Continue Allegra and Nasacort. Guaifenesin as needed for breakthrough congestion. Tylenol /ibuprofen  as needed for headaches associated with allergic sinusitis.  We will treat with a burst of prednisone  today to reduce inflammation to the HEENT system while Singulair  becomes therapeutic given fluid levels behind both eardrums and significant swelling to the nares.   Point-of-care COVID-19 testing is negative.  Counseled patient on potential for adverse effects with medications prescribed/recommended today, strict ER and return-to-clinic precautions discussed, patient verbalized understanding.    Final Clinical Impressions(s) / UC Diagnoses   Final diagnoses:  Nasal congestion  Seasonal allergic rhinitis due to pollen     Discharge Instructions      Your symptoms are likely due to severe environmental allergies.  I would like for you to start taking Singulair  daily at bedtime. If you notice new/worsening anxiety or depression symptoms, stop the medication.   Continue Allegra daily at bedtime.  Continue Nasacort daily.  I would like for you to start prednisone  2 pills once daily for the next 5 days to calm down inflammation and acute allergy flare while the new medication (Singulair ) is kicking in.  You may use guaifenesin (Mucinex) every 12 hours as needed for nasal congestion.  Purchase this over-the-counter.  Use saline nasal sprays as needed for  congestion as well.  I have listed [place] [facility] specialist phone number on your paperwork. Call them to set up an appointment for ongoing evaluation and for further workup of your allergies.  COVID test was negative here today.   If you develop any new or worsening symptoms or if your symptoms do not start to improve, please return here or follow-up with your primary care provider. If your symptoms are severe, please go to the emergency room.     ED Prescriptions     Medication Sig Dispense Auth. Provider   montelukast  (SINGULAIR ) 10 MG tablet Take 1 tablet (10 mg total) by mouth at bedtime. 30 tablet Lucynda Rosano M, FNP   predniSONE  (DELTASONE ) 20 MG tablet Take 2 tablets (40 mg total) by mouth daily with breakfast for 5 days. 10 tablet Enedelia Dorna HERO, FNP      PDMP not reviewed this encounter.   Enedelia Dorna HERO, [place] 11/23/23 1219

## 2023-11-23 NOTE — Discharge Instructions (Addendum)
 Your symptoms are likely due to severe environmental allergies.  I would like for you to start taking Singulair  daily at bedtime. If you notice new/worsening anxiety or depression symptoms, stop the medication.   Continue Allegra daily at bedtime.  Continue Nasacort daily.  I would like for you to start prednisone  2 pills once daily for the next 5 days to calm down inflammation and acute allergy flare while the new medication (Singulair ) is kicking in.  You may use guaifenesin (Mucinex) every 12 hours as needed for nasal congestion.  Purchase this over-the-counter.  Use saline nasal sprays as needed for congestion as well.  I have listed [place] [facility] specialist phone number on your paperwork. Call them to set up an appointment for ongoing evaluation and for further workup of your allergies.  COVID test was negative here today.   If you develop any new or worsening symptoms or if your symptoms do not start to improve, please return here or follow-up with your primary care provider. If your symptoms are severe, please go to the emergency room.

## 2023-11-23 NOTE — ED Triage Notes (Signed)
 Pt reports she has sinus pressure, nasal congestion, and jaw pain x 1 day   Took ibuprofen , allegra, and afrin

## 2023-11-24 ENCOUNTER — Ambulatory Visit: Payer: Self-pay

## 2023-12-04 ENCOUNTER — Other Ambulatory Visit (HOSPITAL_COMMUNITY): Payer: Self-pay

## 2023-12-04 MED ORDER — METHYLPREDNISOLONE 4 MG PO TBPK
ORAL_TABLET | ORAL | 0 refills | Status: DC
  Filled 2023-12-04: qty 21, 6d supply, fill #0

## 2023-12-05 ENCOUNTER — Other Ambulatory Visit (HOSPITAL_COMMUNITY): Payer: Self-pay

## 2023-12-05 MED ORDER — AMPHETAMINE-DEXTROAMPHET ER 25 MG PO CP24
25.0000 mg | ORAL_CAPSULE | Freq: Every day | ORAL | 0 refills | Status: DC
  Filled 2023-12-05: qty 30, 30d supply, fill #0

## 2023-12-18 ENCOUNTER — Other Ambulatory Visit (HOSPITAL_COMMUNITY): Payer: Self-pay

## 2023-12-19 ENCOUNTER — Other Ambulatory Visit: Payer: Self-pay

## 2023-12-19 ENCOUNTER — Other Ambulatory Visit (HOSPITAL_COMMUNITY): Payer: Self-pay

## 2023-12-19 MED ORDER — METHOCARBAMOL 500 MG PO TABS
500.0000 mg | ORAL_TABLET | Freq: Four times a day (QID) | ORAL | 1 refills | Status: DC | PRN
  Filled 2023-12-19: qty 28, 7d supply, fill #0
  Filled 2024-01-21: qty 28, 7d supply, fill #1

## 2023-12-25 ENCOUNTER — Other Ambulatory Visit (HOSPITAL_COMMUNITY): Payer: Self-pay

## 2023-12-26 ENCOUNTER — Other Ambulatory Visit (HOSPITAL_COMMUNITY): Payer: Self-pay

## 2024-01-06 ENCOUNTER — Other Ambulatory Visit: Payer: Self-pay

## 2024-01-06 ENCOUNTER — Other Ambulatory Visit (HOSPITAL_COMMUNITY): Payer: Self-pay

## 2024-01-06 MED ORDER — AMPHETAMINE-DEXTROAMPHET ER 25 MG PO CP24
25.0000 mg | ORAL_CAPSULE | Freq: Every day | ORAL | 0 refills | Status: DC
  Filled 2024-01-06: qty 30, 30d supply, fill #0

## 2024-01-09 ENCOUNTER — Other Ambulatory Visit (HOSPITAL_COMMUNITY): Payer: Self-pay

## 2024-01-16 ENCOUNTER — Ambulatory Visit
Admission: RE | Admit: 2024-01-16 | Discharge: 2024-01-16 | Disposition: A | Discharge status: Home / Self Care | Payer: Self-pay | Source: Ambulatory Visit | Attending: Nurse Practitioner | Admitting: Nurse Practitioner

## 2024-01-16 VITALS — BP 132/87 | HR 60 | Temp 98.7°F | Resp 20

## 2024-01-16 DIAGNOSIS — L739 Follicular disorder, unspecified: Secondary | ICD-10-CM

## 2024-01-16 MED ORDER — MUPIROCIN 2 % EX OINT: 1.0000 | TOPICAL_OINTMENT | Freq: Two times a day (BID) | CUTANEOUS | 0 refills | Status: AC

## 2024-01-16 NOTE — Discharge Instructions (Signed)
 Clean the skin twice daily with a mild cleanser like CeraVe.  Then, apply a thin layer of mupirocin  ointment twice daily over the reddened area.  Seek care if symptoms worsen or do not improve with treatment.

## 2024-01-16 NOTE — ED Triage Notes (Signed)
 Pt reports knot under the left eye, started off as a small pimple x 3 days, lymph nodes swelling, facial swelling has seen primary and was given erythromycin  ointment but has found no relief.

## 2024-01-16 NOTE — ED Provider Notes (Signed)
 [facility]    CSN: [id number] Arrival date & time: 01/16/24  1249      History   Chief Complaint Chief Complaint  Patient presents with   Facial Pain    Entered by patient    HPI Rachel Griffith is a 38 y.o. female.   Patient presents today with 4-day history of left under eye swelling and redness.  She reports the area feels like a pimple but is not getting better like a pimple typically would.  No drainage or oozing.  No fevers or nausea/vomiting.  No vision changes, blurred vision, double vision, or pain when she moves her eyes.  No numbness or tingling in the cheek.  The reddened area has not spread or gotten bigger.  She has been applying erythromycin  ointment without benefit.  Concerned because she has felt similar to lymph nodes in her neck and face.    Past Medical History:  Diagnosis Date   Anxiety    Back injury    Chronic pelvic pain in female    Endometriosis    Endometriosis determined by laparoscopy 10/06/2014   Ovarian cyst    Pelvic pain in female 10/06/2014   PONV (postoperative nausea and vomiting)    pt states that with 1st arthroscopy she passed out and couldnt left her head but they tell me it was a combination of the pain meds and anesthesia No problems with next surgery. Not sure of meds given in anesthesia.   POTS (postural orthostatic tachycardia syndrome)    pt reports this was diagnosed a couple of months ago by PCP (Dr Jarrett)   PTSD (post-traumatic stress disorder)     Patient Active Problem List   Diagnosis Date Noted   Headache 07/10/2020   Left ovarian cyst 07/31/2018   Ovarian cyst, bilateral 02/08/2016   Postoperative abdominal pain 11/24/2014   Status post laparoscopic assisted vaginal hysterectomy (LAVH) 11/23/2014   Pelvic pain in female 10/06/2014   Endometriosis determined by laparoscopy 10/06/2014    Past Surgical History:  Procedure Laterality Date   ABDOMINAL HYSTERECTOMY     endometrosis  2014    [facility]geration-[facility]   KNEE SURGERY Bilateral    x2   LAPAROSCOPIC BILATERAL SALPINGO OOPHERECTOMY Left 07/31/2018   Procedure: LAPAROSCOPIC LEFT SALPINGO OOPHORECTOMY;  Surgeon: Jayne Vonn VEAR, MD;  Location: [facility];  Service: Gynecology;  Laterality: Left;   LAPAROSCOPY N/A 11/26/2014   Procedure: DIAGNOSTIC LAPAROSCOPY;  Surgeon: Norleen Edsel GAILS, MD;  Location: [facility];  Service: Gynecology;  Laterality: N/A;   MICRODISCECTOMY LUMBAR  07/01/2020    OB History     Gravida  2   Para  2   Term  2   Preterm      AB      Living  2      SAB      IAB      Ectopic      Multiple      Live Births               Home Medications    Prior to Admission medications   Medication Sig Start Date End Date Taking? Authorizing Provider  mupirocin ointment (BACTROBAN) 2 % Apply 1 Application topically 2 (two) times daily for 5 days. 01/16/24 01/21/24 Yes Chandra Harlene LABOR, NP  albuterol  (VENTOLIN  HFA) 108 (90 Base) MCG/ACT inhaler Inhale 2 puffs into the lungs every 4 (four) hours as needed for wheezing or shortness of breath. 04/19/19   [provider]  albuterol  (VENTOLIN  HFA) 108 (90 Base) MCG/ACT inhaler INHALE 2 PUFFS BY MOUTH EVERY 4 HOURS AS NEEDED Patient not taking: Reported on 05/11/2022 11/21/20     albuterol  (VENTOLIN  HFA) 108 (90 Base) MCG/ACT inhaler Inhale 2 puffs into the lungs every 4 (four) hours as needed. 08/27/23     ALPRAZolam  (XANAX ) 0.5 MG tablet Take 1 tablet by mouth every day as needed for anxiety. Patient not taking: Reported on 10/05/2022 10/05/20   Shona Rush, MD  ALPRAZolam  (XANAX ) 0.5 MG tablet Take 1 tablet (0.5 mg total) by mouth daily as needed for anxiety 11/21/23     amphetamine -dextroamphetamine  (ADDERALL  XR) 25 MG 24 hr capsule Take 1 capsule by mouth daily. Patient not taking: Reported on 10/05/2022 01/11/22     amphetamine -dextroamphetamine  (ADDERALL  XR) 25 MG 24 hr capsule Take 1 capsule by mouth daily. Patient not taking: Reported on  01/08/2023 10/04/22     amphetamine -dextroamphetamine  (ADDERALL  XR) 25 MG 24 hr capsule Take 1 capsule by mouth daily. 01/06/24     amphetamine -dextroamphetamine  (ADDERALL  XR) 30 MG 24 hr capsule Take 1 capsule (30 mg total) by mouth daily. 11/12/22     amphetamine -dextroamphetamine  (ADDERALL  XR) 30 MG 24 hr capsule Take 1 capsule (30 mg total) by mouth daily. 04/15/23     desogestrel -ethinyl estradiol  (APRI ) 0.15-30 MG-MCG tablet 1 tablet Orally Once a day Patient not taking: Reported on 10/05/2022    [provider]  desogestrel -ethinyl estradiol  (CYRED EQ ) 0.15-30 MG-MCG tablet Take 1 tablet by mouth daily. Take one by mouth daily, skip placebo pills. 07/02/23   Jayne Vonn DEL, MD  fludrocortisone  (FLORINEF ) 0.1 MG tablet Take 1 tablet (0.1 mg total) by mouth 2 (two) times daily. 01/08/23   Waddell Danelle ORN, MD  gabapentin  (NEURONTIN ) 600 MG tablet Take 1 tablet (600 mg total) by mouth 2 times daily and take 3 tablets by mouth at bedtime 11/07/23     ibuprofen  (ADVIL ) 200 MG tablet Take 800 mg by mouth every 6 (six) hours as needed for mild pain or moderate pain.    [provider]  levocetirizine (XYZAL ) 5 MG tablet Take 5 mg by mouth daily.    [provider]  lidocaine  (LIDODERM ) 5 % Place 1 patch onto the skin daily. May wear up to 12 hours.  Leave off for 12 hours. 08/08/23     methocarbamol  (ROBAXIN ) 500 MG tablet Take 1 tablet (500 mg total) by mouth every 8 (eight) hours as needed for muscle spasms. Do not take with alcohol or while driving or operating heavy machinery.  May cause drowsiness. 07/09/22   Chandra Harlene LABOR, NP  methocarbamol  (ROBAXIN ) 500 MG tablet Take 1 tablet (500 mg total) by mouth every 6 (six) hours as needed for muscle spasms 12/19/23     methylPREDNISolone  (MEDROL  DOSEPAK) 4 MG TBPK tablet Use as directed on foil pack for 6 days. 12/04/23     metoCLOPramide  (REGLAN ) 10 MG tablet Take 1 tablet by mouth 4 times a day as needed for 7 days. 08/07/23      montelukast  (SINGULAIR ) 10 MG tablet Take 1 tablet (10 mg total) by mouth at bedtime. 11/23/23   Enedelia Dorna HERO, FNP  oxyCODONE  (ROXICODONE ) 5 MG immediate release tablet Take 1 tablet (5 mg total) by mouth every 6 (six) hours as needed for severe pain (pain score 7-10). 03/24/23   Elnor Jayson LABOR, DO  Potassium Chloride  ER 20 MEQ TBCR Take 1 tablet (20 mEq total) by mouth daily. 03/27/23  vortioxetine  HBr (TRINTELLIX ) 10 MG TABS tablet Take 1 tablet by mouth once daily Patient not taking: Reported on 10/05/2022 10/04/20     vortioxetine  HBr (TRINTELLIX ) 10 MG TABS tablet Take 1 tablet (10 mg total) by mouth daily. 09/16/23       Family History Family History  Problem Relation Age of Onset   Dementia Maternal Grandmother    Parkinson's disease Maternal Grandfather    Cancer Father        prostate   Other Brother        Lyme Disease   Other Sister        IBS   Thrombocytopenia Sister     Social History Social History   Tobacco Use   Smoking status: Former    Current packs/day: 0.00    Types: Cigarettes, E-cigarettes    Quit date: 02/28/2019    Years since quitting: 4.8   Smokeless tobacco: Never  Vaping Use   Vaping status: Former  Substance Use Topics   Alcohol use: Yes    Comment: occ   Drug use: No     Allergies   Almond meal (obsolete), Cymbalta [duloxetine hcl], Duloxetine, Other, Sulfa antibiotics, Trazodone and nefazodone, and Sudafed [pseudoephedrine hcl]   Review of Systems Review of Systems Per HPI  Physical Exam Triage Vital Signs ED Triage Vitals  Encounter Vitals Group     BP 01/16/24 1255 132/87     Girls Systolic BP Percentile --      Girls Diastolic BP Percentile --      Boys Systolic BP Percentile --      Boys Diastolic BP Percentile --      Pulse Rate 01/16/24 1255 60     Resp 01/16/24 1255 20     Temp 01/16/24 1255 98.7 F (37.1 C)     Temp Source 01/16/24 1255 Oral     SpO2 01/16/24 1255 98 %     Weight --      Height --      Head  Circumference --      Peak Flow --      Pain Score 01/16/24 1257 0     Pain Loc --      Pain Education --      Exclude from Growth Chart --    No data found.  Updated Vital Signs BP 132/87 (BP Location: Right Arm)   Pulse 60   Temp 98.7 F (37.1 C) (Oral)   Resp 20   LMP 09/19/2014 Comment: post LAVH  SpO2 98%   Visual Acuity Right Eye Distance:   Left Eye Distance:   Bilateral Distance:    Right Eye Near:   Left Eye Near:    Bilateral Near:     Physical Exam Vitals and nursing note reviewed.  Constitutional:      General: She is not in acute distress.    Appearance: Normal appearance. She is not toxic-appearing.  HENT:     Mouth/Throat:     Mouth: Mucous membranes are moist.     Pharynx: Oropharynx is clear.  Pulmonary:     Effort: Pulmonary effort is normal. No respiratory distress.  Musculoskeletal:     Cervical back: Normal range of motion.  Lymphadenopathy:     Cervical: No cervical adenopathy.  Skin:    General: Skin is warm and dry.     Capillary Refill: Capillary refill takes less than 2 seconds.     Comments: Erythematous papules noted to left under eye.  No surrounding  erythema, drainage, warmth, tenderness to touch.  Left eye is not erythematous, no drainage from the eye.  Patient has extraocular movements without pain.  Neurological:     Mental Status: She is alert and oriented to person, place, and time.  Psychiatric:        Behavior: Behavior is cooperative.      UC Treatments / Results  Labs (all labs ordered are listed, but only abnormal results are displayed) Labs Reviewed - No data to display  EKG   Radiology No results found.  Procedures Procedures (including critical care time)  Medications Ordered in UC Medications - No data to display  Initial Impression / Assessment and Plan / UC Course  I have reviewed the triage vital signs and the nursing notes.  Pertinent labs & imaging results that were available during my care of  the patient were reviewed by me and considered in my medical decision making (see chart for details).   Patient is well-appearing and vital signs are stable.  On exam, she has erythematous papules to the left under eye.  Suspect folliculitis.  Treat with skin cleansing and topical mupirocin ointment twice daily.  Return and ER precautions discussed with patient.  The patient was given the opportunity to ask questions.  All questions answered to their satisfaction.  The patient is in agreement to this plan.   Final Clinical Impressions(s) / UC Diagnoses   Final diagnoses:  Folliculitis     Discharge Instructions      Clean the skin twice daily with a mild cleanser like CeraVe.  Then, apply a thin layer of mupirocin ointment twice daily over the reddened area.  Seek care if symptoms worsen or do not improve with treatment.    ED Prescriptions     Medication Sig Dispense Auth. Provider   mupirocin ointment (BACTROBAN) 2 % Apply 1 Application topically 2 (two) times daily for 5 days. 22 g Chandra Harlene LABOR, NP      PDMP not reviewed this encounter.   Chandra Harlene LABOR, NP 01/16/24 1335

## 2024-01-21 ENCOUNTER — Other Ambulatory Visit: Payer: Self-pay

## 2024-01-29 ENCOUNTER — Other Ambulatory Visit (HOSPITAL_COMMUNITY): Payer: Self-pay

## 2024-02-09 ENCOUNTER — Other Ambulatory Visit (HOSPITAL_COMMUNITY): Payer: Self-pay

## 2024-02-10 ENCOUNTER — Other Ambulatory Visit (HOSPITAL_COMMUNITY): Payer: Self-pay

## 2024-02-10 MED ORDER — AMPHETAMINE-DEXTROAMPHET ER 25 MG PO CP24
25.0000 mg | ORAL_CAPSULE | Freq: Every day | ORAL | 0 refills | Status: DC
  Filled 2024-02-10: qty 30, 30d supply, fill #0

## 2024-02-21 ENCOUNTER — Other Ambulatory Visit (HOSPITAL_COMMUNITY): Payer: Self-pay

## 2024-02-23 ENCOUNTER — Other Ambulatory Visit (HOSPITAL_COMMUNITY): Payer: Self-pay

## 2024-02-24 ENCOUNTER — Other Ambulatory Visit (HOSPITAL_COMMUNITY): Payer: Self-pay

## 2024-02-24 MED ORDER — METHOCARBAMOL 500 MG PO TABS
500.0000 mg | ORAL_TABLET | Freq: Four times a day (QID) | ORAL | 1 refills | Status: AC | PRN
  Filled 2024-02-24: qty 28, 7d supply, fill #0
  Filled 2024-03-30: qty 28, 7d supply, fill #1

## 2024-02-25 ENCOUNTER — Other Ambulatory Visit (HOSPITAL_COMMUNITY): Payer: Self-pay

## 2024-02-25 MED ORDER — ALPRAZOLAM 0.5 MG PO TABS
0.5000 mg | ORAL_TABLET | Freq: Every day | ORAL | 2 refills | Status: AC | PRN
  Filled 2024-02-25: qty 30, 30d supply, fill #0
  Filled 2024-03-30: qty 30, 30d supply, fill #1

## 2024-02-26 ENCOUNTER — Other Ambulatory Visit (HOSPITAL_COMMUNITY): Payer: Self-pay

## 2024-02-28 ENCOUNTER — Other Ambulatory Visit (HOSPITAL_COMMUNITY): Payer: Self-pay

## 2024-02-28 MED ORDER — LIDOCAINE 5 % EX PTCH
1.0000 | MEDICATED_PATCH | Freq: Every day | CUTANEOUS | 2 refills | Status: AC
  Filled 2024-02-28: qty 30, 30d supply, fill #0

## 2024-03-02 ENCOUNTER — Other Ambulatory Visit (HOSPITAL_COMMUNITY): Payer: Self-pay

## 2024-03-02 MED ORDER — METOCLOPRAMIDE HCL 10 MG PO TABS
10.0000 mg | ORAL_TABLET | Freq: Four times a day (QID) | ORAL | 3 refills | Status: AC
  Filled 2024-03-02: qty 28, 7d supply, fill #0

## 2024-03-03 ENCOUNTER — Other Ambulatory Visit (HOSPITAL_COMMUNITY): Payer: Self-pay

## 2024-03-03 MED ORDER — MONTELUKAST SODIUM 10 MG PO TABS
10.0000 mg | ORAL_TABLET | Freq: Every day | ORAL | 1 refills | Status: AC
  Filled 2024-03-03: qty 90, 90d supply, fill #0

## 2024-03-08 ENCOUNTER — Other Ambulatory Visit (HOSPITAL_COMMUNITY): Payer: Self-pay

## 2024-03-09 ENCOUNTER — Other Ambulatory Visit (HOSPITAL_COMMUNITY): Payer: Self-pay

## 2024-03-09 MED ORDER — AMPHETAMINE-DEXTROAMPHET ER 25 MG PO CP24
25.0000 mg | ORAL_CAPSULE | Freq: Every day | ORAL | 0 refills | Status: AC
  Filled 2024-03-09: qty 30, 30d supply, fill #0

## 2024-03-19 ENCOUNTER — Other Ambulatory Visit: Payer: Self-pay | Admitting: Internal Medicine

## 2024-03-19 ENCOUNTER — Other Ambulatory Visit (HOSPITAL_COMMUNITY): Payer: Self-pay

## 2024-03-19 MED ORDER — FLUDROCORTISONE ACETATE 0.1 MG PO TABS
0.1000 mg | ORAL_TABLET | Freq: Two times a day (BID) | ORAL | 0 refills | Status: AC
  Filled 2024-03-19 (×2): qty 60, 30d supply, fill #0

## 2024-03-31 ENCOUNTER — Other Ambulatory Visit (HOSPITAL_COMMUNITY): Payer: Self-pay

## 2024-03-31 ENCOUNTER — Other Ambulatory Visit: Payer: Self-pay

## 2024-04-01 ENCOUNTER — Encounter: Payer: Self-pay | Admitting: Allergy & Immunology

## 2024-04-01 ENCOUNTER — Ambulatory Visit: Admitting: Allergy & Immunology

## 2024-04-01 VITALS — BP 128/76 | HR 80 | Temp 98.2°F | Ht 68.7 in | Wt 158.4 lb

## 2024-04-01 DIAGNOSIS — J310 Chronic rhinitis: Secondary | ICD-10-CM

## 2024-04-01 DIAGNOSIS — J4520 Mild intermittent asthma, uncomplicated: Secondary | ICD-10-CM

## 2024-04-01 NOTE — Progress Notes (Signed)
 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 Mild intermittent asthma, uncomplicated  - Lung testing looks great today. - I do not think you need a controller medication at this point in time. - Singulair  can help with asthma anyway and this is likely helping your breathing. - Daily controller medication(s): Singulair  10mg  daily - Prior to physical activity: albuterol  2 puffs 10-15 minutes before physical activity. - Rescue medications: albuterol  4 puffs every 4-6 hours as needed - Asthma control goals:  * Full participation in all desired activities (may need albuterol  before activity) * Albuterol  use two time or less a week on average (not counting use with activity) * Cough interfering with sleep two time or less a month * Oral steroids no more than once a year * No hospitalizations  2. Chronic rhinitis - Because of insurance stipulations, we cannot do skin testing on the same day as your first visit. - We are all working to fight this, but for now we need to do two separate visits.  - We will know more after we do testing at the next visit.  - The skin testing visit can be squeezed in at your convenience.  - Then we can make a more full plan to address all of your symptoms. - Be sure to stop your antihistamines for 3 days before this appointment.   3. Return in about 1 week (around 04/08/2024) for SKIN TESTING (1-55). You can have the follow up appointment with Dr. Iva or a Nurse Practicioner (our Nurse Practitioners are excellent and always have Physician oversight!).    Subjective:   Rachel Griffith was referred by Rachel Norleen PEDLAR, MD.     Rachel Griffith is a 39 y.o. female  presenting for an evaluation of allergies.  Asthma/Respiratory Symptom History: She becomes SOB and feel awful with physical activity. She has an inhaler that works when she needs to use it.  She has not problems when she exercises indoors. She has issues in the fall when they are falling. She is fine in the inter. She has some in the spring and summer, but fall into winter is the worst. She has a history of smoking for three years, which she attributes to a period following her divorce.   Allergic Rhinitis Symptom History: She has a lot of sneezing when she is outsdoors in the allergies. She feels like she has the flu. For the past few years, she has experienced progressively worsening allergy symptoms when running outdoors, including sneezing and flu-like symptoms primarily affecting the upper respiratory tract. She reports her symptoms are mainly upper respiratory, and she generally feels unwell during these episodes.  She has tried various interventions, such as running with a mask and using an inhaler, without success. She eventually sought care at urgent care, where she was prescribed steroids and Singulair , which have significantly improved her quality of life.  Her symptoms are notably worse in the fall and winter, particularly when leaves are on the ground, but improve when snow covers the leaves. She experiences some symptoms in the summer and spring, but they are less severe.  She has not been formally tested for allergies and has no significant food allergies, although she  has experienced mild reactions to almonds and curry in the past.  Her past medical history includes frequent ear infections as a child and sinus infections as an adult due to a deviated septum. She has not undergone sinus surgery and uses antibiotics only when necessary for sinus infections.  Skin Symptom History: She has no history of eczema or hives.   She does have an extensive history of anxiety and depression and  is on a number of medications for this.  She also has a history of trigeminal back pain and takes gabapentin .  She also is on methocarbamol  for back pain as well.  She has a history of POTS and is on sodium chloride .  Otherwise, there is no history of other atopic diseases, including drug allergies, stinging insect allergies, or contact dermatitis. There is no significant infectious history. Vaccinations are up to date.    Past Medical History: Patient Active Problem List   Diagnosis Date Noted   Headache 07/10/2020   Left ovarian cyst 07/31/2018   Ovarian cyst, bilateral 02/08/2016   Postoperative abdominal pain 11/24/2014   Status post laparoscopic assisted vaginal hysterectomy (LAVH) 11/23/2014   Pelvic pain in female 10/06/2014   Endometriosis determined by laparoscopy 10/06/2014    Medication List:  Allergies as of 04/01/2024       Reactions   Almond Meal (obsolete) Anaphylaxis   Cymbalta [duloxetine Hcl]    Duloxetine    Other    Almonds   Sulfa Antibiotics Hives   Trazodone And Nefazodone    Sever body pain   Sudafed [pseudoephedrine Hcl] Palpitations        Medication List        Accurate as of April 01, 2024  2:51 PM. If you have any questions, ask your nurse or doctor.          STOP taking these medications    methylPREDNISolone  4 MG Tbpk tablet Commonly known as: MEDROL  DOSEPAK Stopped by: Marty Shaggy, MD   oxyCODONE  5 MG immediate release tablet Commonly known as: Roxicodone  Stopped by: Marty Shaggy, MD       TAKE these medications    Adderall  XR 25 MG 24 hr capsule Generic drug: amphetamine -dextroamphetamine  Take 1 capsule by mouth daily.   amphetamine -dextroamphetamine  25 MG 24 hr capsule Commonly known as: ADDERALL  XR Take 1 capsule by mouth daily.   amphetamine -dextroamphetamine  30 MG 24 hr capsule Commonly known as: ADDERALL  XR Take 1 capsule (30 mg total) by mouth daily.   amphetamine -dextroamphetamine  30 MG 24 hr  capsule Commonly known as: ADDERALL  XR Take 1 capsule (30 mg total) by mouth daily.   amphetamine -dextroamphetamine  25 MG 24 hr capsule Commonly known as: ADDERALL  XR Take 1 capsule by mouth daily.   albuterol  108 (90 Base) MCG/ACT inhaler Commonly known as: VENTOLIN  HFA Inhale 2 puffs into the lungs every 4 (four) hours as needed for wheezing or shortness of breath.   albuterol  108 (90 Base) MCG/ACT inhaler Commonly known as: VENTOLIN  HFA INHALE 2 PUFFS BY MOUTH EVERY 4 HOURS AS NEEDED   albuterol  108 (90 Base) MCG/ACT inhaler Commonly known as: Ventolin  HFA Inhale 2 puffs into the lungs every 4 (four) hours as needed.   ALPRAZolam  0.5 MG tablet Commonly known as: XANAX  Take 1 tablet by mouth every day as needed for anxiety.   ALPRAZolam  0.5 MG tablet Commonly known as: XANAX  Take 1 tablet (0.5 mg total) by mouth daily as needed for anxiety.   Apri  0.15-30 MG-MCG tablet Generic drug: desogestrel -ethinyl estradiol  1  tablet Orally Once a day   Cyred EQ  0.15-30 MG-MCG tablet Generic drug: desogestrel -ethinyl estradiol  Take 1 tablet by mouth daily. Take one by mouth daily, skip placebo pills.   fludrocortisone  0.1 MG tablet Commonly known as: FLORINEF  Take 1 tablet (0.1 mg total) by mouth 2 (two) times daily.   gabapentin  600 MG tablet Commonly known as: NEURONTIN  Take 1 tablet (600 mg total) by mouth 2 times daily and take 3 tablets by mouth at bedtime   ibuprofen  200 MG tablet Commonly known as: ADVIL  Take 800 mg by mouth every 6 (six) hours as needed for mild pain or moderate pain.   levocetirizine 5 MG tablet Commonly known as: XYZAL  Take 5 mg by mouth daily.   lidocaine  5 % Commonly known as: Lidoderm  Place 1 patch onto the skin daily. May wear up to 12 hours.  Leave off for 12 hours.   methocarbamol  500 MG tablet Commonly known as: ROBAXIN  Take 1 tablet (500 mg total) by mouth every 8 (eight) hours as needed for muscle spasms. Do not take with alcohol or  while driving or operating heavy machinery.  May cause drowsiness.   methocarbamol  500 MG tablet Commonly known as: ROBAXIN  Take 1 tablet (500 mg total) by mouth every 6 (six) hours as needed for muscle spasms   metoCLOPramide  10 MG tablet Commonly known as: REGLAN  Take 1 tablet by mouth 4 times a day as needed for 7 days.   montelukast  10 MG tablet Commonly known as: SINGULAIR  Take 1 tablet (10 mg total) by mouth daily.   Potassium Chloride  ER 20 MEQ Tbcr Take 1 tablet (20 mEq total) by mouth daily.   Trintellix  10 MG Tabs tablet Generic drug: vortioxetine  HBr Take 1 tablet by mouth once daily   Trintellix  10 MG Tabs tablet Generic drug: vortioxetine  HBr Take 1 tablet (10 mg total) by mouth daily.        Birth History: non-contributory  Developmental History: non-contributory  Past Surgical History: Past Surgical History:  Procedure Laterality Date   ABDOMINAL HYSTERECTOMY     endometrosis  2014   [facility]ation[facility]   KNEE SURGERY Bilateral    x2   LAPAROSCOPIC BILATERAL SALPINGO OOPHERECTOMY Left 07/31/2018   Procedure: LAPAROSCOPIC LEFT SALPINGO OOPHORECTOMY;  Surgeon: Jayne Vonn DEL, MD;  Location: [facility];  Service: Gynecology;  Laterality: Left;   LAPAROSCOPY N/A 11/26/2014   Procedure: DIAGNOSTIC LAPAROSCOPY;  Surgeon: Norleen Edsel GAILS, MD;  Location: [facility];  Service: Gynecology;  Laterality: N/A;   MICRODISCECTOMY LUMBAR  07/01/2020     Family History: Family History  Problem Relation Age of Onset   Dementia Maternal Grandmother    Parkinson's disease Maternal Grandfather    Cancer Father        prostate   Other Brother        Lyme Disease   Other Sister        IBS   Thrombocytopenia Sister      Social History: Baylynn lives at home with her family. She lives in a house that is around 66 ish years old.  There are hardwood and rugs in the main living areas of carpeting in the bedrooms.  They have electric heating and central cooling with some  window units as well.  There are 2 cats and 1 dog inside of the home.  There are no dust mite covers on the bedding.  There is no tobacco exposure.  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  There is exposure to fumes, chemicals, and dust in her workplace.  There is no HEPA filter in her home.  [place] do not [place] an interstate or industrial area.   Review of systems otherwise negative other than that mentioned in the HPI.    Objective:   Blood pressure 128/76, pulse 80, temperature 98.2 F (36.8 C), height 5' 8.7 (1.745 m), weight 158 lb 6 oz (71.8 kg), last menstrual period 09/19/2014, SpO2 97%. Body mass index is 23.59 kg/m.     Physical Exam Vitals reviewed.  Constitutional:      Appearance: She is well-developed.  HENT:     Head: Normocephalic and atraumatic.     Right Ear: Tympanic membrane, ear canal and external ear normal. No drainage, swelling or tenderness. Tympanic membrane is not injected, scarred, erythematous, retracted or bulging.     Left Ear: Tympanic membrane, ear canal and external ear normal. No drainage, swelling or tenderness. Tympanic membrane is not injected, scarred, erythematous, retracted or bulging.     Nose: Mucosal edema and rhinorrhea present. No nasal deformity or septal deviation.     Right Turbinates: Enlarged, swollen and pale.     Left Turbinates: Enlarged, swollen and pale.     Right Sinus: No maxillary sinus tenderness or frontal sinus tenderness.     Left Sinus: No maxillary sinus tenderness or frontal sinus tenderness.     Mouth/Throat:     Mouth: Mucous membranes are not pale and not dry.     Pharynx: Uvula midline.  Eyes:     General: Allergic shiner present.        Right eye: No discharge.        Left eye: No discharge.     Conjunctiva/sclera: Conjunctivae normal.     Right eye: Right conjunctiva is not injected. No chemosis.    Left eye: Left conjunctiva is  not injected. No chemosis.    Pupils: Pupils are equal, round, and reactive to light.  Cardiovascular:     Rate and Rhythm: Normal rate and regular rhythm.     Heart sounds: Normal heart sounds.  Pulmonary:     Effort: Pulmonary effort is normal. No tachypnea, accessory muscle usage or respiratory distress.     Breath sounds: Normal breath sounds. No wheezing, rhonchi or rales.  Chest:     Chest wall: No tenderness.  Abdominal:     Tenderness: There is no abdominal tenderness. There is no guarding or rebound.  Lymphadenopathy:     Head:     Right side of head: No submandibular, tonsillar or occipital adenopathy.     Left side of head: No submandibular, tonsillar or occipital adenopathy.     Cervical: No cervical adenopathy.  Skin:    Coloration: Skin is not pale.     Findings: No abrasion, erythema, petechiae or rash. Rash is not papular, urticarial or vesicular.  Neurological:     Mental Status: She is alert.  Psychiatric:        Behavior: Behavior is cooperative.      Diagnostic studies:    Spirometry: results normal (FEV1: 3.24/92%, FVC: 4.11/94%, FEV1/FVC: 79%).    Spirometry consistent with normal pattern.    Allergy Studies: deferred due to insurance stipulations that require a separate visit for testing       Marty Shaggy, MD [facility]       "

## 2024-04-01 NOTE — Patient Instructions (Addendum)
 1. Mild intermittent asthma, uncomplicated (Primary) - Lung testing looks great today. - I do not think you need a controller medication at this point in time. - Singulair  can help with asthma anyway and this is likely helping your breathing. - Daily controller medication(s): Singulair  10mg  daily - Prior to physical activity: albuterol  2 puffs 10-15 minutes before physical activity. - Rescue medications: albuterol  4 puffs every 4-6 hours as needed - Asthma control goals:  * Full participation in all desired activities (may need albuterol  before activity) * Albuterol  use two time or less a week on average (not counting use with activity) * Cough interfering with sleep two time or less a month * Oral steroids no more than once a year * No hospitalizations  2. Chronic rhinitis - Because of insurance stipulations, we cannot do skin testing on the same day as your first visit. - We are all working to fight this, but for now we need to do two separate visits.  - We will know more after we do testing at the next visit.  - The skin testing visit can be squeezed in at your convenience.  - Then we can make a more full plan to address all of your symptoms. - Be sure to stop your antihistamines for 3 days before this appointment.   3. Return in about 1 week (around 04/08/2024) for SKIN TESTING (1-55). You can have the follow up appointment with Dr. Iva or a Nurse Practicioner (our Nurse Practitioners are excellent and always have Physician oversight!).    Please inform us  of any Emergency Department visits, hospitalizations, or changes in symptoms. Call us  before going to the ED for breathing or allergy symptoms since we might be able to fit you in for a sick visit. Feel free to contact us  anytime with any questions, problems, or concerns.  It was a pleasure to meet you today!  Websites that have reliable patient information: 1. American Academy of Asthma, Allergy, and Immunology:  www.aaaai.org 2. Food Allergy Research and Education (FARE): foodallergy.org 3. Mothers of Asthmatics: http://www.asthmacommunitynetwork.org 4. American College of Allergy, Asthma, and Immunology: www.acaai.org      Like us  on [employer] and Instagram for our latest updates!      A healthy democracy works best when [employer] participate! Make sure you are registered to vote! If you have moved or changed any of your contact information, you will need to get this updated before voting! Scan the QR codes below to learn more!     Allergy Shots  Allergies are the result of a chain reaction that starts in the immune system. Your immune system controls how your body defends itself. For instance, if you have an allergy to pollen, your immune system identifies pollen as an invader or allergen. Your immune system overreacts by producing antibodies called Immunoglobulin E (IgE). These antibodies travel to cells that release chemicals, causing an allergic reaction.  The concept behind allergy immunotherapy, whether it is received in the form of shots or tablets, is that the immune system can be desensitized to specific allergens that trigger allergy symptoms. Although it requires time and patience, the payback can be long-term relief. Allergy injections contain a dilute solution of those substances that you are allergic to based upon your skin testing and allergy history.   How Do Allergy Shots Work?  Allergy shots work much like a vaccine. Your body responds to injected amounts of a particular allergen given in increasing doses, eventually developing a resistance and tolerance  to it. Allergy shots can lead to decreased, minimal or no allergy symptoms.  There generally are two phases: build-up and maintenance. Build-up often ranges from three to six months and involves receiving injections with increasing amounts of the allergens. The shots are typically given once or twice a week, though more rapid  build-up schedules are sometimes used.  The maintenance phase begins when the most effective dose is reached. This dose is different for each person, depending on how allergic you are and your response to the build-up injections. Once the maintenance dose is reached, there are longer periods between injections, typically two to four weeks.  Occasionally doctors give cortisone-type shots that can temporarily reduce allergy symptoms. These types of shots are different and should not be confused with allergy immunotherapy shots.  Who Can Be Treated with Allergy Shots?  Allergy shots may be a good treatment approach for people with allergic rhinitis (hay fever), allergic asthma, conjunctivitis (eye allergy) or stinging insect allergy.   Before deciding to begin allergy shots, you should consider:   The length of allergy season and the severity of your symptoms  Whether medications and/or changes to your environment can control your symptoms  Your desire to avoid long-term medication use  Time: allergy immunotherapy requires a major time commitment  Cost: may vary depending on your insurance coverage  Allergy shots for children age 12 and older are effective and often well tolerated. They might prevent the onset of new allergen sensitivities or the progression to asthma.  Allergy shots are not started on patients who are pregnant but can be continued on patients who become pregnant while receiving them. In some patients with other medical conditions or who take certain common medications, allergy shots may be of risk. It is important to mention other medications you talk to your allergist.   What are the two types of build-ups offered:   RUSH or Rapid Desensitization -- one day of injections lasting from 8:30-4:30pm, injections every 1 hour.  Approximately half of the build-up process is completed in that one day.  The following week, normal build-up is resumed, and this entails ~16 visits  either weekly or twice weekly, until reaching your maintenance dose which is continued weekly until eventually getting spaced out to every month for a duration of 3 to 5 years. The regular build-up appointments are nurse visits where the injections are administered, followed by required monitoring for 30 minutes.    Traditional build-up -- weekly visits for 6 -12 months until reaching maintenance dose, then continue weekly until eventually spacing out to every 4 weeks as above. At these appointments, the injections are administered, followed by required monitoring for 30 minutes.     Either way is acceptable, and both are equally effective. With the rush protocol, the advantage is that less time is spent here for injections overall AND you would also reach maintenance dosing faster (which is when the clinical benefit starts to become more apparent). Not everyone is a candidate for rapid desensitization.   IF we proceed with the RUSH protocol, there are premedications which must be taken the day before and the day after the rush only (this includes antihistamines, steroids, and Singulair ).  After the rush day, no prednisone  or Singulair  is required, and we just recommend antihistamines taken on your injection day.  What Is An Estimate of the Costs?  If you are interested in starting allergy injections, please check with your insurance company about your coverage for both allergy vial sets  and allergy injections.  Please do so prior to making the appointment to start injections.  The following are CPT codes to give to your insurance company. These are the amounts we BILL to the insurance company, but the amount YOU WILL PAY and WE RECEIVE IS SUBSTANTIALLY LESS and depends on the contracts we have with different insurance companies.   Amount Billed to Insurance One allergy vial set  CPT 95165   $ 1200     Two allergy vial set  CPT 95165   $ 2400     Three allergy vial set  CPT 95165   $ 3600      One injection   CPT 95115   $ 35  Two injections   CPT 95117   $ 40 RUSH (Rapid Desensitization) CPT 95180 x 8 hours $500/hour  Regarding the allergy injections, your co-pay may or may not apply with each injection, so please confirm this with your insurance company. When you start allergy injections, 1 or 2 sets of vials are made based on your allergies.  Not all patients can be on one set of vials. A set of vials lasts 6 months to a year depending on how quickly you can proceed with your build-up of your allergy injections. Vials are personalized for each patient depending on their specific allergens.  How often are allergy injection given during the build-up period?   Injections are given at least weekly during the build-up period until your maintenance dose is achieved. Per the doctor's discretion, you may have the option of getting allergy injections two times per week during the build-up period. However, there must be at least 48 hours between injections. The build-up period is usually completed within 6-12 months depending on your ability to schedule injections and for adjustments for reactions. When maintenance dose is reached, your injection schedule is gradually changed to every two weeks and later to every three weeks. Injections will then continue every 4 weeks. Usually, injections are continued for a total of 3-5 years.   When Will I Feel Better?  Some may experience decreased allergy symptoms during the build-up phase. For others, it may take as long as 12 months on the maintenance dose. If there is no improvement after a year of maintenance, your allergist will discuss other treatment options with you.  If you arent responding to allergy shots, it may be because there is not enough dose of the allergen in your vaccine or there are missing allergens that were not identified during your allergy testing. Other reasons could be that there are high levels of the allergen in your  environment or major exposure to non-allergic triggers like tobacco smoke.  What Is the Length of Treatment?  Once the maintenance dose is reached, allergy shots are generally continued for three to five years. The decision to stop should be discussed with your allergist at that time. Some people may experience a permanent reduction of allergy symptoms. Others may relapse and a longer course of allergy shots can be considered.  What Are the Possible Reactions?  The two types of adverse reactions that can occur with allergy shots are local and systemic. Common local reactions include very mild redness and swelling at the injection site, which can happen immediately or several hours after. Report a delayed reaction from your last injection. These include arm swelling or runny nose, watery eyes or cough that occurs within 12-24 hours after injection. A systemic reaction, which is less common, affects the entire body  or a particular body system. They are usually mild and typically respond quickly to medications. Signs include increased allergy symptoms such as sneezing, a stuffy nose or hives.   Rarely, a serious systemic reaction called anaphylaxis can develop. Symptoms include swelling in the throat, wheezing, a feeling of tightness in the chest, nausea or dizziness. Most serious systemic reactions develop within 30 minutes of allergy shots. This is why it is strongly recommended you wait in your doctors office for 30 minutes after your injections. Your allergist is trained to watch for reactions, and his or her staff is trained and equipped with the proper medications to identify and treat them.   Report to the nurse immediately if you experience any of the following symptoms: swelling, itching or redness of the skin, hives, watery eyes/nose, breathing difficulty, excessive sneezing, coughing, stomach pain, diarrhea, or light headedness. These symptoms may occur within 15-20 minutes after injection and  may require medication.   Who Should Administer Allergy Shots?  The preferred location for receiving shots is your prescribing allergists office. Injections can sometimes be given at another facility where the physician and staff are trained to recognize and treat reactions, and have received instructions by your prescribing allergist.  What if I am late for an injection?   Injection dose will be adjusted depending upon how many days or weeks you are late for your injection.   What if I am sick?   Please report any illness to the nurse before receiving injections. She may adjust your dose or postpone injections depending on your symptoms. If you have fever, flu, sinus infection or chest congestion it is best to postpone allergy injections until you are better. Never get an allergy injection if your asthma is causing you problems. If your symptoms persist, seek out medical care to get your health problem under control.  What If I am or Become Pregnant:  Women that become pregnant should schedule an appointment with [facility] before receiving any further allergy injections.

## 2024-04-02 ENCOUNTER — Other Ambulatory Visit (HOSPITAL_COMMUNITY): Payer: Self-pay

## 2024-04-13 ENCOUNTER — Ambulatory Visit: Admitting: Neurology

## 2024-04-29 ENCOUNTER — Ambulatory Visit: Admitting: Allergy & Immunology
